# Patient Record
Sex: Female | Born: 1990 | Hispanic: No | Smoking: Never smoker
Health system: Southern US, Community
[De-identification: ages and names within clinical notes are randomized; demographics above are authoritative.]

## PROBLEM LIST (undated history)

## (undated) ENCOUNTER — Inpatient Hospital Stay (HOSPITAL_COMMUNITY): Payer: Self-pay

## (undated) DIAGNOSIS — G43109 Migraine with aura, not intractable, without status migrainosus: Secondary | ICD-10-CM

## (undated) DIAGNOSIS — IMO0002 Reserved for concepts with insufficient information to code with codable children: Secondary | ICD-10-CM

## (undated) DIAGNOSIS — F329 Major depressive disorder, single episode, unspecified: Secondary | ICD-10-CM

## (undated) DIAGNOSIS — F603 Borderline personality disorder: Secondary | ICD-10-CM

## (undated) DIAGNOSIS — N739 Female pelvic inflammatory disease, unspecified: Secondary | ICD-10-CM

## (undated) DIAGNOSIS — F419 Anxiety disorder, unspecified: Secondary | ICD-10-CM

## (undated) DIAGNOSIS — O24419 Gestational diabetes mellitus in pregnancy, unspecified control: Secondary | ICD-10-CM

## (undated) DIAGNOSIS — Z5189 Encounter for other specified aftercare: Secondary | ICD-10-CM

## (undated) DIAGNOSIS — F319 Bipolar disorder, unspecified: Secondary | ICD-10-CM

## (undated) DIAGNOSIS — F32A Depression, unspecified: Secondary | ICD-10-CM

## (undated) DIAGNOSIS — Z8759 Personal history of other complications of pregnancy, childbirth and the puerperium: Secondary | ICD-10-CM

## (undated) HISTORY — DX: Personal history of other complications of pregnancy, childbirth and the puerperium: Z87.59

## (undated) HISTORY — DX: Borderline personality disorder: F60.3

## (undated) HISTORY — DX: Bipolar disorder, unspecified: F31.9

## (undated) HISTORY — DX: Encounter for other specified aftercare: Z51.89

## (undated) HISTORY — DX: Reserved for concepts with insufficient information to code with codable children: IMO0002

## (undated) HISTORY — DX: Female pelvic inflammatory disease, unspecified: N73.9

## (undated) HISTORY — PX: WISDOM TOOTH EXTRACTION: SHX21

## (undated) HISTORY — DX: Migraine with aura, not intractable, without status migrainosus: G43.109

---
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
## 2012-01-29 DIAGNOSIS — G43909 Migraine, unspecified, not intractable, without status migrainosus: Secondary | ICD-10-CM | POA: Insufficient documentation

## 2012-01-29 DIAGNOSIS — J45909 Unspecified asthma, uncomplicated: Secondary | ICD-10-CM | POA: Insufficient documentation

## 2012-02-27 ENCOUNTER — Emergency Department (HOSPITAL_COMMUNITY)
Admission: EM | Admit: 2012-02-27 | Discharge: 2012-02-27 | Disposition: A | Discharge status: Home / Self Care | Payer: Medicaid Other | Attending: Emergency Medicine | Admitting: Emergency Medicine

## 2012-02-27 ENCOUNTER — Emergency Department (HOSPITAL_COMMUNITY): Payer: Medicaid Other

## 2012-02-27 ENCOUNTER — Encounter (HOSPITAL_COMMUNITY): Payer: Self-pay | Admitting: Physical Medicine and Rehabilitation

## 2012-02-27 DIAGNOSIS — R071 Chest pain on breathing: Secondary | ICD-10-CM | POA: Insufficient documentation

## 2012-02-27 DIAGNOSIS — R110 Nausea: Secondary | ICD-10-CM | POA: Insufficient documentation

## 2012-02-27 DIAGNOSIS — J45901 Unspecified asthma with (acute) exacerbation: Secondary | ICD-10-CM | POA: Insufficient documentation

## 2012-02-27 DIAGNOSIS — R059 Cough, unspecified: Secondary | ICD-10-CM | POA: Insufficient documentation

## 2012-02-27 DIAGNOSIS — R0789 Other chest pain: Secondary | ICD-10-CM

## 2012-02-27 DIAGNOSIS — R05 Cough: Secondary | ICD-10-CM | POA: Insufficient documentation

## 2012-02-27 DIAGNOSIS — R0602 Shortness of breath: Secondary | ICD-10-CM | POA: Insufficient documentation

## 2012-02-27 LAB — CBC WITH DIFFERENTIAL/PLATELET
Basophils Relative: 0 % (ref 0–1)
Eosinophils Absolute: 0.2 10*3/uL (ref 0.0–0.7)
Eosinophils Relative: 3 % (ref 0–5)
Hemoglobin: 12 g/dL (ref 12.0–15.0)
Lymphs Abs: 2 10*3/uL (ref 0.7–4.0)
MCH: 29.3 pg (ref 26.0–34.0)
MCHC: 32.7 g/dL (ref 30.0–36.0)
MCV: 89.5 fL (ref 78.0–100.0)
Monocytes Relative: 7 % (ref 3–12)
RBC: 4.1 MIL/uL (ref 3.87–5.11)

## 2012-02-27 NOTE — ED Notes (Signed)
Explained to pts mother that pt will be seen by RN in triage and will be back out with her in waiting area.  Also explained that she will be welcome to accompany pt once she has a room, apologized for inconvenience.

## 2012-02-27 NOTE — ED Provider Notes (Signed)
History  This chart was scribed for Monica Munoz, non-physician practitioner working with Monica Munoz,  by Monica Munoz, ED Scribe. This patient was seen in room TR10C/TR10C and the patient's care was started at 3:22 PM.  CSN: [id number]  Arrival date & time 02/27/12  1343   First MD Initiated Contact with Patient 02/27/12 1522      Chief Complaint  Patient presents with  . Chest Pain    Patient is a 22 y.o. female presenting with chest pain. The history is provided by the patient. No language interpreter was used.  Chest Pain Pain location:  Substernal area Pain radiates to:  Does not radiate Pain radiates to the back: no   Onset quality:  Sudden Duration:  1 day Timing:  Intermittent Progression:  Unchanged Chronicity:  New Context: breathing   Relieved by:  None tried Worsened by:  Nothing tried Ineffective treatments:  None tried Associated symptoms: cough, nausea and shortness of breath   Associated symptoms: no abdominal pain and not vomiting   Risk factors: no diabetes mellitus, no high cholesterol and no hypertension     Monica Munoz is a 22 y.o. female who presents to the Emergency Department complaining of intermittent substernal CP described as a grabbing pain felt mostly upon inspiration that started yesterday with associated SOB, nonproductive cough and nausea for the past week. Pt states that SOB is worse with exertion and reports that she feels SOB after walking a few feet. She denies that the SOB and CP occur simultaneously. She reports one prior episode of similar symptoms diagnosed as an ulcer. She denies being on medication for ulcers currently. She denies any recent changes in BMs and denies emesis, abdominal pain and wheezing as associated symptoms. She has a h/o asthma and is an occasional alcohol user but denies smoking.  PCP is Dr. Cyndia Munoz.  Past Medical History  Diagnosis Date  . Asthma     No past surgical history on file.  No family  history on file.  History  Substance Use Topics  . Smoking status: Never Smoker   . Smokeless tobacco: Not on file  . Alcohol Use: Yes     Comment: social    No OB history provided.  Review of Systems  Respiratory: Positive for cough and shortness of breath. Negative for wheezing.   Cardiovascular: Positive for chest pain. Negative for leg swelling.  Gastrointestinal: Positive for nausea. Negative for vomiting, abdominal pain and diarrhea.  All other systems reviewed and are negative.    Allergies  Benadryl; Diphenhydramine hcl; and Lorazepam  Home Medications   Current Outpatient Rx  Name  Route  Sig  Dispense  Refill  . acetaminophen (TYLENOL) 325 MG tablet   Oral   Take 650 mg by mouth every 6 (six) hours as needed for pain (for migraine).         Marland Kitchen albuterol (PROVENTIL HFA;VENTOLIN HFA) 108 (90 BASE) MCG/ACT inhaler   Inhalation   Inhale 2 puffs into the lungs every 6 (six) hours as needed for wheezing. For shortness of breath           Triage Vitals: BP 127/82  Pulse 104  Temp(Src) 98.1 F (36.7 C) (Oral)  Resp 16  SpO2 97%  LMP 02/22/2012  Physical Exam  Nursing note and vitals reviewed. Constitutional: She is oriented to person, place, and time. She appears well-developed and well-nourished. No distress.  HENT:  Head: Normocephalic and atraumatic.  Mouth/Throat: Oropharynx is clear and moist.  TMs  are normal bilaterally  Eyes: Conjunctivae and EOM are normal. Pupils are equal, round, and reactive to light.  Neck: Normal range of motion. Neck supple. No tracheal deviation present.  Cardiovascular: Normal rate and regular rhythm.   Pulmonary/Chest: Effort normal and breath sounds normal. No respiratory distress. She has no wheezes. She exhibits tenderness (upper sternal tenderness to palpation).  Abdominal: Soft. There is no tenderness.  Musculoskeletal: Normal range of motion. She exhibits no edema.  Lymphadenopathy:    She has no cervical  adenopathy.  Neurological: She is alert and oriented to person, place, and time.  Skin: Skin is warm and dry.  Psychiatric: She has a normal mood and affect. Her behavior is normal.    ED Course  Procedures (including critical care time)  DIAGNOSTIC STUDIES: Oxygen Saturation is 97% on room air, adequate by my interpretation.    COORDINATION OF CARE: 3:27 PM- Pt was tachycardic upon arrival to the ED (HR 104) but was not during my exam. Advised pt of negative radiology results. Discussed treatment plan which includes CXR, d-dimer and CBC with pt and pt agreed to plan.   4:31 PM- Advised pt of radiology and lab work results. Discussed discharge plan which includes tyelnol for pain and asthma inhaler for SOB with pt and pt agreed to plan. Also advised pt to be cautious with NSAIDs due to her h/o ulcers and to follow up with PCP and pt agreed.  Labs Reviewed  CBC WITH DIFFERENTIAL  D-DIMER, QUANTITATIVE   Dg Chest 2 View  02/27/2012  *RADIOLOGY REPORT*  Clinical Data: Chest pain and shortness of breath since yesterday. History of asthma.  CHEST - 2 VIEW  Comparison: None.  Findings:  The heart size and mediastinal contours are within normal limits.  Both lungs are clear.  The visualized skeletal structures are unremarkable.  IMPRESSION: No active cardiopulmonary disease.   Original Report Authenticated By: Davonna Belling, M.D.      No diagnosis found.    MDM    I personally performed the services described in this documentation, which was scribed in my presence. The recorded information has been reviewed and is accurate.        Jimmye Norman, NP 02/27/12 [phone]

## 2012-02-27 NOTE — ED Notes (Signed)
Pt presents to department for evaluation of chest pain upon inspiration. Onset last night while at home. 6/10 pain with breathing. Respirations unlabored. Lung sounds clear and equal bilaterally. Also states dry cough x1 week. Pt is alert and oriented x4. No signs of acute distress noted.

## 2012-02-28 NOTE — ED Provider Notes (Signed)
Medical screening examination/treatment/procedure(s) were performed by non-physician practitioner and as supervising physician I was immediately available for consultation/collaboration.  Caterra Ostroff J. Shyler Hamill, MD 02/28/12 2306 

## 2012-04-25 ENCOUNTER — Ambulatory Visit (INDEPENDENT_AMBULATORY_CARE_PROVIDER_SITE_OTHER): Payer: Medicaid Other | Admitting: Neurology

## 2012-04-25 ENCOUNTER — Encounter: Payer: Self-pay | Admitting: Neurology

## 2012-04-25 VITALS — BP 115/77 | HR 98 | Temp 98.8°F | Ht 65.0 in | Wt 155.0 lb

## 2012-04-25 DIAGNOSIS — G43009 Migraine without aura, not intractable, without status migrainosus: Secondary | ICD-10-CM

## 2012-04-25 DIAGNOSIS — G43109 Migraine with aura, not intractable, without status migrainosus: Secondary | ICD-10-CM

## 2012-04-25 HISTORY — DX: Migraine with aura, not intractable, without status migrainosus: G43.109

## 2012-04-25 MED ORDER — TOPIRAMATE 50 MG PO TABS: 50.0000 mg | ORAL_TABLET | Freq: Two times a day (BID) | ORAL | Status: DC

## 2012-04-25 NOTE — Progress Notes (Signed)
Subjective:    Patient ID: Monica Munoz is a 22 y.o. female.  HPI  Huston Foley, MD, PhD [facility] [address], [address] [address] [place], [place] [address]  Dear Dr. Cyndia Bent,   I saw your patient, Monica Munoz, upon your kind request in my neurologic clinic today for initial consultation of her headaches. The patient is unaccompanied today. As you know, Monica Munoz is a very pleasant 22 year old right-handed woman with a underlying medical history of asthma and allergies who has been experiencing migrainous headaches for the past several years. She had migraines since 2008 when she was still living in [place]. The problem has been intermittent in the past and pain is primarily located in the frontal area with the severity up to 7/10. The patient has had some lightheadedness, but denies any recent fevers, cold symptoms or associated neurological complications such as one-sided weakness, numbness, tingling, slurring of speech or droopy face. There is a family history of migraines. The pain is described as throbbing and is associated with nausea but not vomiting. She does describe photophobia and sonophobia. She has never had a brain scan. Since last year, she has had more frequent HAs.  She sleeps fairly well at night and is not known to snore.  She has tried over-the-counter medications including Excedrin, Aleve, Ibuprofen, tylenol. Prescription medications tried are: fioricet, which caused a stomach ulcer. She does not recall having tried a triptan. She has also tried Fiorinal. She has never been on any prescription medication for prevention. She has not had her eyes checked since last year. She was given a Rx for glasses, but never had them made.  There is a hx of blood clots in her mother and MGF, but the patient has not been tested for a clotting d/o, she has never had a blood clot, but is not able to take OCP. Mother, MGF and his sisters had strokes, mother has had seizures as  well. Her father passed away at 20 with heart and he had a "hole in his heart". She has 2 older sisters, one had migraines, and has epilepsy.   Her Past Medical History Is Significant For: Past Medical History  Diagnosis Date  . Asthma     Her Past Surgical History Is Significant For: No past surgical history on file.  Her Family History Is Significant For: No family history on file.  Her Social History Is Significant For: History   Social History  . Marital Status: Single    Spouse Name: N/A    Number of Children: N/A  . Years of Education: N/A   Social History Main Topics  . Smoking status: Never Smoker   . Smokeless tobacco: None  . Alcohol Use: Yes     Comment: social  . Drug Use: No  . Sexually Active: None   Other Topics Concern  . None   Social History Narrative  . None    Her Allergies Are:  Allergies  Allergen Reactions  . Benadryl (Diphenhydramine Hcl)   . Diphenhydramine Hcl   . Lorazepam     Patient states that it makes her "loopy" and "off the wall"  :   Her Current Medications Are:  Outpatient Encounter Prescriptions as of 04/25/2012  Medication Sig Dispense Refill  . acetaminophen (TYLENOL) 325 MG tablet Take 650 mg by mouth every 6 (six) hours as needed for pain (for migraine).      Marland Kitchen albuterol (PROVENTIL HFA;VENTOLIN HFA) 108 (90 BASE) MCG/ACT inhaler Inhale 2  puffs into the lungs every 6 (six) hours as needed for wheezing. For shortness of breath       No facility-administered encounter medications on file as of 04/25/2012.  :  Review of Systems  Neurological: Positive for dizziness and headaches.  Psychiatric/Behavioral: Positive for dysphoric mood.       Insomnia    Objective:  Neurologic Exam  Physical Exam Physical Examination:   Filed Vitals:   04/25/12 1334  BP: 115/77  Pulse: 98  Temp: 98.8 F (37.1 C)    General Examination: The patient is a very pleasant 22 y.o. female in no acute distress.  HEENT: Normocephalic,  atraumatic, pupils are equal, round and reactive to light and accommodation. Funduscopic exam is normal with sharp disc margins noted. Extraocular tracking is good without nystagmus noted. Normal smooth pursuit is noted. Hearing is grossly intact. Tympanic membranes are clear bilaterally. Face is symmetric with normal facial animation and normal facial sensation. Speech is clear with no dysarthria noted. There is no hypophonia. There is no lip, neck or jaw tremor. Neck is supple with full range of motion. There are no carotid bruits on auscultation. Oropharynx exam reveals normal findings. No significant airway crowding is noted. Mallampati is class I. Tongue protrudes centrally and palate elevates symmetrically. Tonsils are 2+.   Chest: is clear to auscultation without wheezing, rhonchi or crackles noted.  Heart: sounds are regular and normal without murmurs, rubs or gallops noted.   Abdomen: is soft, non-tender and non-distended with normal bowel sounds appreciated on auscultation.  Extremities: There is no pitting edema in the distal lower extremities bilaterally. Pedal pulses are intact.  Skin: is warm and dry with no trophic changes noted.  Musculoskeletal: exam reveals no obvious joint deformities, tenderness or joint swelling or erythema.  Neurologically:  Mental status: The patient is awake, alert and oriented in all 4 spheres. Her memory, attention, language and knowledge are appropriate. There is no aphasia, agnosia, apraxia or anomia. Speech is clear with normal prosody and enunciation. Thought process is linear. Mood is congruent and affect is normal.  Cranial nerves are as described above under HEENT exam. In addition, shoulder shrug is normal with equal shoulder height noted. Motor exam: Normal bulk, strength and tone is noted. There is no drift, tremor or rebound. Romberg is negative. Reflexes are 2+ throughout. Toes are downgoing bilaterally. Fine motor skills are intact with normal  finger taps, normal hand movements, normal rapid alternating patting, normal foot taps and normal foot agility.  Cerebellar testing shows no dysmetria or intention tremor on finger to nose testing. Heel to shin is unremarkable. There is no truncal or gait ataxia.  Sensory exam is intact to light touch, pinprick, vibration, temperature sense and proprioception in the upper and lower extremities.  Gait, station and balance are unremarkable. No veering to one side is noted. No leaning to one side. Posture is age-appropriate and stance is narrow based. No problems turning are noted. She turns en bloc. Tandem walk is unremarkable. Intact toe and heel stance is noted.               Assessment and Plan:   Assessment and Plan:  In summary, Monica Munoz is a very pleasant 22 y.o.-year old female with a history of migraines without aura. Her physical exam is non-focal and I reassured the patient in that regard. Nevertheless, she has a complicated FHx, including heart d/s, clotting d/o, strokes and epilepsy. I had a long chat with the patient about my  findings and the diagnosis of migraine, the prognosis and treatment options. We talked about medical treatments and non-pharmacological approaches. We talked about stopping marijuana maintaining a healthy lifestyle in general. I encouraged the patient to eat healthy, exercise daily and keep well hydrated, to keep a scheduled bedtime and wake time routine, to not skip any meals and eat healthy snacks in between meals and to have protein with every meal.   As far as further diagnostic testing is concerned, I suggested the following today: Echocardiogram to look for PFO, MRI of brain w/o Gad, Comprehensive metabolic profile, ANA, anticardiolipin antibodies, antithrombin III, CBC, coagulation profile, CRP, ESR, factor V Leiden, lupus anticoagulant, protein C, protein S and TSH. As far as medications are concerned, I recommended the following at this time: starting topiramate,  50 mg, 1/2 each night for 1 week, then 1 pill each night for 1 week, then 1 1/2 pills each night for 1 week, then 2 pills each night thereafter.  I answered all her questions today and the patient was in agreement with the above outlined plan. I would like to see the patient back in 3 months, sooner if the need arises and encouraged her to call with any interim questions, concerns, problems or updates and refill requests and test results.   Thank you very much for allowing me to participate in the care of this nice patient. If I can be of any further assistance to you please do not hesitate to call me at [phone].  Sincerely,   Huston Foley, MD, PhD

## 2012-04-25 NOTE — Patient Instructions (Addendum)
I think overall you are doing fairly well but I do want to suggest a few things today: Your exam is normal. I do want to do further testing:   Echocardiogram to look for PFO, MRI of brain w/o Gad, Comprehensive metabolic profile, ANA, anticardiolipin antibodies, antithrombin III, CBC, coagulation profile, CRP, ESR, factor V Leiden, lupus anticoagulant, protein C, protein S and TSH. As far as medications are concerned, I recommended the following at this time: starting topiramate, 50 mg, 1/2 each night for 1 week, then 1 pill each night for 1 week, then 1 1/2 pills each night for 1 week, then 2 pills each night thereafter.   Remember to drink plenty of fluid, eat healthy meals and do not skip any meals. Try to eat protein with a every meal and eat a healthy snack such as fruit or nuts in between meals. Try to keep a regular sleep-wake schedule and try to exercise daily, particularly in the form of walking, 20-30 minutes a day, if you can.   I would like to see you back in 3 months, sooner if we need to. Please call us with any interim questions, concerns, problems, updates or refill requests.  Please also call us for any test results so we can go over those with you on the phone. Brett Canales is my clinical assistant and will answer any of your questions and relay your messages to me and also relay most of my messages to you.  Our phone number is [phone]. We also have an after hours call service for urgent matters and there is a physician on-call for urgent questions. For any emergencies you know to call 911 or go to the nearest emergency room.

## 2012-05-02 LAB — COMPREHENSIVE METABOLIC PANEL
ALT: 13 IU/L (ref 0–32)
AST: 16 IU/L (ref 0–40)
Albumin/Globulin Ratio: 1.5 (ref 1.1–2.5)
Alkaline Phosphatase: 92 IU/L (ref 39–117)
BUN/Creatinine Ratio: 14 (ref 8–20)
Chloride: 102 mmol/L (ref 97–108)
GFR calc Af Amer: 147 mL/min/{1.73_m2} (ref >59)
GFR calc non Af Amer: 128 mL/min/{1.73_m2} (ref >59)
Potassium: 4.1 mmol/L (ref 3.5–5.2)
Sodium: 140 mmol/L (ref 134–144)
Total Bilirubin: 0.4 mg/dL (ref 0.0–1.2)

## 2012-05-02 LAB — COAGULATION PANEL
APTT PPP: 54.9 s — ABNORMAL HIGH (ref 23.4–36.4)
AntiThromb III Func: 109 % (ref 75–135)
Dilute Viper Venom Time: 39.4 s (ref 0.0–55.1)
Factor V Activity: 42 % — ABNORMAL LOW (ref 60–140)
INR: 2 — ABNORMAL HIGH (ref 0.8–1.2)
PT: 22.5 s — ABNORMAL HIGH (ref 11.9–14.1)
PTT Lupus Anticoagulant: 38.9 s (ref 0.0–50.0)
Protein S Ag, Total: 90 % (ref 58–150)
dPT Confirm Ratio: 0.99 Ratio (ref 0.00–1.20)

## 2012-05-02 LAB — PROTIME-INR: Prothrombin Time: 11.8 s (ref 9.1–12.0)

## 2012-05-06 ENCOUNTER — Encounter (HOSPITAL_COMMUNITY): Payer: Self-pay

## 2012-05-06 ENCOUNTER — Encounter: Payer: Self-pay | Admitting: *Deleted

## 2012-05-06 ENCOUNTER — Ambulatory Visit (HOSPITAL_COMMUNITY): Payer: Medicaid Other | Attending: Internal Medicine | Admitting: Radiology

## 2012-05-06 DIAGNOSIS — I319 Disease of pericardium, unspecified: Secondary | ICD-10-CM | POA: Insufficient documentation

## 2012-05-06 DIAGNOSIS — R42 Dizziness and giddiness: Secondary | ICD-10-CM

## 2012-05-06 DIAGNOSIS — I079 Rheumatic tricuspid valve disease, unspecified: Secondary | ICD-10-CM | POA: Insufficient documentation

## 2012-05-06 DIAGNOSIS — G43009 Migraine without aura, not intractable, without status migrainosus: Secondary | ICD-10-CM

## 2012-05-06 NOTE — Progress Notes (Signed)
Echocardiogram performed with a Bubble Study.  

## 2012-05-06 NOTE — Progress Notes (Signed)
Quick Note:  Spoke with patient and relayed results of blood work. Patient understood and had no questions.  ______ 

## 2012-05-06 NOTE — Progress Notes (Signed)
Quick Note:  Please call and advise the patient that the recent labs we checked were within normal limits. We checked cell count, electrolytes, kidney function, liver function, thyroid function, inflammatory markers and for clotting disorders and everything looks okay. I would like to fax the labs to PCP. Pls find out who she sees and fax to them, thx. Please remind patient to keep any upcoming appointments and call with any interim questions, concerns, problems or updates. Thanks,  Huston Foley, MD, PhD   ______

## 2012-05-06 NOTE — Progress Notes (Signed)
Patient ID: Monica Munoz, female   DOB: 1990/06/15, 22 y.o.   MRN: [id number]  IV with 22 G angiocath (R) AC x 1, tolerated well for Echo Bubble Study.Irean Hong, RN.

## 2012-05-07 ENCOUNTER — Other Ambulatory Visit: Payer: Medicaid Other

## 2012-05-07 NOTE — Progress Notes (Signed)
Quick Note:  Please call and let patient know that her recent echocardiogram, which is the ultrasound of her heart was reported as normal.  Huston Foley, MD, PhD [facility] ([facility]  ______

## 2012-05-08 ENCOUNTER — Ambulatory Visit
Admission: RE | Admit: 2012-05-08 | Discharge: 2012-05-08 | Disposition: A | Discharge status: Home / Self Care | Payer: Medicaid Other | Source: Ambulatory Visit | Attending: Neurology | Admitting: Neurology

## 2012-05-08 DIAGNOSIS — R51 Headache: Secondary | ICD-10-CM

## 2012-05-08 DIAGNOSIS — G43009 Migraine without aura, not intractable, without status migrainosus: Secondary | ICD-10-CM

## 2012-05-09 NOTE — Progress Notes (Signed)
Quick Note:  Please call and advise the patient that the recent scan we did was within normal limits. We checked a brain MRI without contrast. Please remind patient to keep any upcoming appointments and call with any interim questions, concerns, problems or updates. Thanks,  Huston Foley, MD, PhD  ______

## 2012-05-10 NOTE — Progress Notes (Signed)
Quick Note:  Spoke with patient and relayed results of her recent EKG. Patient understood and had no questions. ______

## 2012-07-26 ENCOUNTER — Ambulatory Visit: Payer: Medicaid Other | Admitting: Neurology

## 2012-07-30 ENCOUNTER — Ambulatory Visit: Payer: Medicaid Other | Admitting: Neurology

## 2012-09-10 ENCOUNTER — Emergency Department (HOSPITAL_COMMUNITY)
Admission: EM | Admit: 2012-09-10 | Discharge: 2012-09-10 | Disposition: A | Discharge status: Home / Self Care | Payer: Medicaid Other | Attending: Emergency Medicine | Admitting: Emergency Medicine

## 2012-09-10 ENCOUNTER — Emergency Department (HOSPITAL_COMMUNITY): Payer: Medicaid Other

## 2012-09-10 DIAGNOSIS — Z79899 Other long term (current) drug therapy: Secondary | ICD-10-CM | POA: Insufficient documentation

## 2012-09-10 DIAGNOSIS — Z3490 Encounter for supervision of normal pregnancy, unspecified, unspecified trimester: Secondary | ICD-10-CM

## 2012-09-10 DIAGNOSIS — O9989 Other specified diseases and conditions complicating pregnancy, childbirth and the puerperium: Secondary | ICD-10-CM | POA: Insufficient documentation

## 2012-09-10 DIAGNOSIS — G43109 Migraine with aura, not intractable, without status migrainosus: Secondary | ICD-10-CM | POA: Insufficient documentation

## 2012-09-10 DIAGNOSIS — J45909 Unspecified asthma, uncomplicated: Secondary | ICD-10-CM | POA: Insufficient documentation

## 2012-09-10 DIAGNOSIS — R0789 Other chest pain: Secondary | ICD-10-CM

## 2012-09-10 DIAGNOSIS — R071 Chest pain on breathing: Secondary | ICD-10-CM | POA: Insufficient documentation

## 2012-09-10 LAB — POCT PREGNANCY, URINE: Preg Test, Ur: POSITIVE — AB

## 2012-09-10 NOTE — ED Notes (Signed)
Bed: WA06 Expected date: 09/10/12 Expected time: 8:34 PM Means of arrival: Ambulance Comments: EMS--chest wall pain

## 2012-09-10 NOTE — ED Provider Notes (Signed)
CSN: [id number]     Arrival date & time 09/10/12  2047 History   First MD Initiated Contact with Patient 09/10/12 2118     Chief Complaint  Patient presents with  . Chest Wall Pain    (Consider location/radiation/quality/duration/timing/severity/associated sxs/prior Treatment) Patient is a 22 y.o. female presenting with chest pain.  Chest Pain Pain location:  R chest Pain quality: sharp   Pain radiates to:  Does not radiate Pain radiates to the back: no   Pain severity:  Moderate Onset quality:  Sudden Duration:  3 hours Timing:  Intermittent Progression:  Waxing and waning Chronicity:  New Context: breathing   Relieved by:  None tried Worsened by:  Deep breathing Ineffective treatments:  None tried Associated symptoms: no cough   Risk factors: smoking   Risk factors: no birth control     Past Medical History  Diagnosis Date  . Asthma   . Migraine with aura 04/25/2012   No past surgical history on file. No family history on file. History  Substance Use Topics  . Smoking status: Never Smoker   . Smokeless tobacco: Not on file  . Alcohol Use: Yes     Comment: social   OB History   Grav Para Term Preterm Abortions TAB SAB Ect Mult Living                 Review of Systems  Respiratory: Negative for cough.   Cardiovascular: Positive for chest pain.  All other systems reviewed and are negative.    Allergies  Benadryl; Diphenhydramine hcl; and Lorazepam  Home Medications   Current Outpatient Rx  Name  Route  Sig  Dispense  Refill  . acetaminophen (TYLENOL) 325 MG tablet   Oral   Take 650 mg by mouth every 6 (six) hours as needed for pain.          Marland Kitchen albuterol (PROVENTIL HFA;VENTOLIN HFA) 108 (90 BASE) MCG/ACT inhaler   Inhalation   Inhale 2 puffs into the lungs every 6 (six) hours as needed for wheezing or shortness of breath.          Marland Kitchen ibuprofen (ADVIL,MOTRIN) 200 MG tablet   Oral   Take 600 mg by mouth every 8 (eight) hours as needed for pain.        Marland Kitchen topiramate (TOPAMAX) 50 MG tablet   Oral   Take 1 tablet (50 mg total) by mouth 2 (two) times daily. Follow instructions provided   60 tablet   3    BP 123/71  Pulse 87  Temp(Src) 98 F (36.7 C) (Oral)  Resp 16  SpO2 100%  LMP 07/11/2012 Physical Exam  Nursing note and vitals reviewed. Constitutional: She is oriented to person, place, and time. She appears well-developed and well-nourished.  HENT:  Head: Normocephalic.  Eyes: Pupils are equal, round, and reactive to light.  Neck: Normal range of motion.  Cardiovascular: Normal rate and regular rhythm.   Pulmonary/Chest: Effort normal and breath sounds normal. She exhibits tenderness.  Abdominal: Soft. Bowel sounds are normal.  Musculoskeletal: She exhibits no edema and no tenderness.  Lymphadenopathy:    She has no cervical adenopathy.  Neurological: She is alert and oriented to person, place, and time.  Skin: Skin is warm and dry.  Psychiatric: She has a normal mood and affect. Her behavior is normal. Judgment and thought content normal.    ED Course  Procedures (including critical care time) Labs Review Labs Reviewed - No data to display Imaging Review Dg  Chest 2 View  09/10/2012   *RADIOLOGY REPORT*  Clinical Data: Chest wall pain.  CHEST - 2 VIEW  Comparison: 02/27/2012  Findings: The lungs are hypoinflated with slight elevation of the left hemidiaphragm. There is no focal consolidation or effusion. Cardiomediastinal silhouette and remainder of the exam is unchanged.  IMPRESSION: No acute cardiopulmonary disease.   Original Report Authenticated By: Elberta Fortis, M.D.   Radiology and lab results reviewed, shared with patient. Chest wall pain on palpation.  No dyspnea.  Not tachycardic.  100% O2 saturation. Patient denies pelvic or abdominal pain, dysuria, vaginal discharge.  MDM  No diagnosis found. Chest wall pain. Early pregnancy.    Jimmye Norman, NP 09/11/12 [phone]

## 2012-09-10 NOTE — ED Notes (Addendum)
Pt is having chest wall pain that started when she was laughing earlier. Center of chest and underR breast. Hurts more when she breathes.

## 2012-09-11 NOTE — ED Provider Notes (Signed)
Medical screening examination/treatment/procedure(s) were performed by non-physician practitioner and as supervising physician I was immediately available for consultation/collaboration.  Flint Melter, MD 09/11/12 [phone]

## 2012-10-02 LAB — OB RESULTS CONSOLE ABO/RH: RH Type: POSITIVE

## 2012-10-02 LAB — OB RESULTS CONSOLE GBS: GBS: POSITIVE

## 2012-10-02 LAB — OB RESULTS CONSOLE RUBELLA ANTIBODY, IGM: RUBELLA: IMMUNE

## 2012-10-02 LAB — OB RESULTS CONSOLE ANTIBODY SCREEN: ANTIBODY SCREEN: NEGATIVE

## 2012-10-02 LAB — OB RESULTS CONSOLE HIV ANTIBODY (ROUTINE TESTING): HIV: NONREACTIVE

## 2012-10-02 LAB — OB RESULTS CONSOLE GC/CHLAMYDIA
CHLAMYDIA, DNA PROBE: NEGATIVE
Gonorrhea: NEGATIVE

## 2012-10-02 LAB — OB RESULTS CONSOLE RPR: RPR: NONREACTIVE

## 2012-10-02 LAB — OB RESULTS CONSOLE HEPATITIS B SURFACE ANTIGEN: HEP B S AG: NEGATIVE

## 2012-10-12 ENCOUNTER — Encounter: Payer: Self-pay | Admitting: Obstetrics and Gynecology

## 2012-10-12 DIAGNOSIS — O09899 Supervision of other high risk pregnancies, unspecified trimester: Secondary | ICD-10-CM | POA: Insufficient documentation

## 2012-10-12 DIAGNOSIS — Z87440 Personal history of urinary (tract) infections: Secondary | ICD-10-CM

## 2013-01-14 ENCOUNTER — Telehealth: Payer: Self-pay | Admitting: Family Medicine

## 2013-01-15 NOTE — Telephone Encounter (Signed)
Made error wrong patient didn't correct date of birth.

## 2013-01-16 NOTE — L&D Delivery Note (Signed)
Delivery Note  Pt pushing had been pushing involuntarily, then continued pushing well, FHR w early variables, overall remained reassuring     At 8:49 PM a viable female was delivered via Vaginal, Spontaneous Delivery (Presentation: Right Occiput Anterior).  Shoulders delivered easily  APGAR: 9, 9; weight 8 lb 10.3 oz (3920 g).   Placenta status: Intact, Spontaneous.  Cord: 3 vessels with the following complications: None.  Cord pH: not collected  Anesthesia: Epidural, lidocoaine for repair  Episiotomy: None Lacerations: 2nd degree;R Labial;Perineal Suture Repair: 3.0 vicryl 4-0 monocryl Est. Blood Loss (mL):   Mom to postpartum.  Baby to Couplet care / Skin to Skin. Pt plans to BF Pt declines circumcision   Monica Munoz 05/28/2013, 12:14 AM

## 2013-02-02 ENCOUNTER — Inpatient Hospital Stay (HOSPITAL_COMMUNITY)
Admission: AD | Admit: 2013-02-02 | Discharge: 2013-02-03 | Disposition: A | Discharge status: Home / Self Care | Payer: Medicaid Other | Source: Ambulatory Visit | Attending: Obstetrics and Gynecology | Admitting: Obstetrics and Gynecology

## 2013-02-02 DIAGNOSIS — O9989 Other specified diseases and conditions complicating pregnancy, childbirth and the puerperium: Principal | ICD-10-CM

## 2013-02-02 DIAGNOSIS — Y92009 Unspecified place in unspecified non-institutional (private) residence as the place of occurrence of the external cause: Secondary | ICD-10-CM | POA: Insufficient documentation

## 2013-02-02 DIAGNOSIS — W010XXA Fall on same level from slipping, tripping and stumbling without subsequent striking against object, initial encounter: Secondary | ICD-10-CM | POA: Insufficient documentation

## 2013-02-02 DIAGNOSIS — O99891 Other specified diseases and conditions complicating pregnancy: Secondary | ICD-10-CM | POA: Insufficient documentation

## 2013-02-02 DIAGNOSIS — Y9301 Activity, walking, marching and hiking: Secondary | ICD-10-CM | POA: Insufficient documentation

## 2013-02-02 DIAGNOSIS — R109 Unspecified abdominal pain: Secondary | ICD-10-CM | POA: Insufficient documentation

## 2013-02-02 HISTORY — DX: Major depressive disorder, single episode, unspecified: F32.9

## 2013-02-02 HISTORY — DX: Anxiety disorder, unspecified: F41.9

## 2013-02-02 HISTORY — DX: Depression, unspecified: F32.A

## 2013-02-03 ENCOUNTER — Inpatient Hospital Stay (HOSPITAL_COMMUNITY): Payer: Medicaid Other

## 2013-02-03 ENCOUNTER — Encounter (HOSPITAL_COMMUNITY): Payer: Self-pay

## 2013-02-03 MED ORDER — IBUPROFEN 600 MG PO TABS
600.0000 mg | ORAL_TABLET | Freq: Once | ORAL | Status: AC
  Administered 2013-02-03: 600 mg via ORAL
  Filled 2013-02-03: qty 1

## 2013-02-03 NOTE — MAU Note (Signed)
Tripped over cat and landed on left side around 2300 tonight. Lower abdominal pain since then. Denies LOF or Vaginal bleeding. Last fetal movement 6pm today.

## 2013-02-03 NOTE — Discharge Instructions (Signed)
Second Trimester of Pregnancy The second trimester is from week 13 through week 28, months 4 through 6. The second trimester is often a time when you feel your best. Your body has also adjusted to being pregnant, and you begin to feel better physically. Usually, morning sickness has lessened or quit completely, you may have more energy, and you may have an increase in appetite. The second trimester is also a time when the fetus is growing rapidly. At the end of the sixth month, the fetus is about 9 inches long and weighs about 1 pounds. You will likely begin to feel the baby move (quickening) between 18 and 20 weeks of the pregnancy. BODY CHANGES Your body goes through many changes during pregnancy. The changes vary from woman to woman.   Your weight will continue to increase. You will notice your lower abdomen bulging out.  You may begin to get stretch marks on your hips, abdomen, and breasts.  You may develop headaches that can be relieved by medicines approved by your caregiver.  You may urinate more often because the fetus is pressing on your bladder.  You may develop or continue to have heartburn as a result of your pregnancy.  You may develop constipation because certain hormones are causing the muscles that push waste through your intestines to slow down.  You may develop hemorrhoids or swollen, bulging veins (varicose veins).  You may have back pain because of the weight gain and pregnancy hormones relaxing your joints between the bones in your pelvis and as a result of a shift in weight and the muscles that support your balance.  Your breasts will continue to grow and be tender.  Your gums may bleed and may be sensitive to brushing and flossing.  Dark spots or blotches (chloasma, mask of pregnancy) may develop on your face. This will likely fade after the baby is born.  A dark line from your belly button to the pubic area (linea nigra) may appear. This will likely fade after the  baby is born. WHAT TO EXPECT AT YOUR PRENATAL VISITS During a routine prenatal visit:  You will be weighed to make sure you and the fetus are growing normally.  Your blood pressure will be taken.  Your abdomen will be measured to track your baby's growth.  The fetal heartbeat will be listened to.  Any test results from the previous visit will be discussed. Your caregiver may ask you:  How you are feeling.  If you are feeling the baby move.  If you have had any abnormal symptoms, such as leaking fluid, bleeding, severe headaches, or abdominal cramping.  If you have any questions. Other tests that may be performed during your second trimester include:  Blood tests that check for:  Low iron levels (anemia).  Gestational diabetes (between 24 and 28 weeks).  Rh antibodies.  Urine tests to check for infections, diabetes, or protein in the urine.  An ultrasound to confirm the proper growth and development of the baby.  An amniocentesis to check for possible genetic problems.  Fetal screens for spina bifida and Down syndrome. HOME CARE INSTRUCTIONS   Avoid all smoking, herbs, alcohol, and unprescribed drugs. These chemicals affect the formation and growth of the baby.  Follow your caregiver's instructions regarding medicine use. There are medicines that are either safe or unsafe to take during pregnancy.  Exercise only as directed by your caregiver. Experiencing uterine cramps is a good sign to stop exercising.  Continue to eat regular,   healthy meals.  Wear a good support bra for breast tenderness.  Do not use hot tubs, steam rooms, or saunas.  Wear your seat belt at all times when driving.  Avoid raw meat, uncooked cheese, cat litter boxes, and soil used by cats. These carry germs that can cause birth defects in the baby.  Take your prenatal vitamins.  Try taking a stool softener (if your caregiver approves) if you develop constipation. Eat more high-fiber foods,  such as fresh vegetables or fruit and whole grains. Drink plenty of fluids to keep your urine clear or pale yellow.  Take warm sitz baths to soothe any pain or discomfort caused by hemorrhoids. Use hemorrhoid cream if your caregiver approves.  If you develop varicose veins, wear support hose. Elevate your feet for 15 minutes, 3 4 times a day. Limit salt in your diet.  Avoid heavy lifting, wear low heel shoes, and practice good posture.  Rest with your legs elevated if you have leg cramps or low back pain.  Visit your dentist if you have not gone yet during your pregnancy. Use a soft toothbrush to brush your teeth and be gentle when you floss.  A sexual relationship may be continued unless your caregiver directs you otherwise.  Continue to go to all your prenatal visits as directed by your caregiver. SEEK MEDICAL CARE IF:   You have dizziness.  You have mild pelvic cramps, pelvic pressure, or nagging pain in the abdominal area.  You have persistent nausea, vomiting, or diarrhea.  You have a bad smelling vaginal discharge.  You have pain with urination. SEEK IMMEDIATE MEDICAL CARE IF:   You have a fever.  You are leaking fluid from your vagina.  You have spotting or bleeding from your vagina.  You have severe abdominal cramping or pain.  You have rapid weight gain or loss.  You have shortness of breath with chest pain.  You notice sudden or extreme swelling of your face, hands, ankles, feet, or legs.  You have not felt your baby move in over an hour.  You have severe headaches that do not go away with medicine.  You have vision changes. Document Released: 12/27/2000 Document Revised: 09/04/2012 Document Reviewed: 03/05/2012 [employer] Patient Information 2014 [employer].  

## 2013-05-21 ENCOUNTER — Telehealth (HOSPITAL_COMMUNITY): Payer: Self-pay | Admitting: *Deleted

## 2013-05-21 ENCOUNTER — Encounter (HOSPITAL_COMMUNITY): Payer: Self-pay | Admitting: *Deleted

## 2013-05-21 NOTE — Telephone Encounter (Signed)
Preadmission screen  

## 2013-05-26 ENCOUNTER — Inpatient Hospital Stay (HOSPITAL_COMMUNITY)
Admission: RE | Admit: 2013-05-26 | Discharge: 2013-05-29 | DRG: 775 | Disposition: A | Discharge status: Home / Self Care | Payer: Medicaid Other | Source: Ambulatory Visit | Attending: Obstetrics and Gynecology | Admitting: Obstetrics and Gynecology

## 2013-05-26 ENCOUNTER — Encounter (HOSPITAL_COMMUNITY): Payer: Self-pay

## 2013-05-26 VITALS — BP 105/61 | HR 91 | Temp 98.3°F | Resp 18 | Ht 65.0 in | Wt 168.0 lb

## 2013-05-26 DIAGNOSIS — O480 Post-term pregnancy: Principal | ICD-10-CM | POA: Diagnosis present

## 2013-05-26 DIAGNOSIS — O9903 Anemia complicating the puerperium: Secondary | ICD-10-CM | POA: Diagnosis not present

## 2013-05-26 DIAGNOSIS — J45909 Unspecified asthma, uncomplicated: Secondary | ICD-10-CM

## 2013-05-26 DIAGNOSIS — F3289 Other specified depressive episodes: Secondary | ICD-10-CM | POA: Diagnosis present

## 2013-05-26 DIAGNOSIS — F32A Depression, unspecified: Secondary | ICD-10-CM

## 2013-05-26 DIAGNOSIS — F329 Major depressive disorder, single episode, unspecified: Secondary | ICD-10-CM

## 2013-05-26 DIAGNOSIS — F411 Generalized anxiety disorder: Secondary | ICD-10-CM

## 2013-05-26 DIAGNOSIS — O9989 Other specified diseases and conditions complicating pregnancy, childbirth and the puerperium: Secondary | ICD-10-CM

## 2013-05-26 DIAGNOSIS — F419 Anxiety disorder, unspecified: Secondary | ICD-10-CM | POA: Insufficient documentation

## 2013-05-26 DIAGNOSIS — D649 Anemia, unspecified: Secondary | ICD-10-CM | POA: Diagnosis not present

## 2013-05-26 DIAGNOSIS — Z8279 Family history of other congenital malformations, deformations and chromosomal abnormalities: Secondary | ICD-10-CM

## 2013-05-26 DIAGNOSIS — Z889 Allergy status to unspecified drugs, medicaments and biological substances status: Secondary | ICD-10-CM | POA: Insufficient documentation

## 2013-05-26 DIAGNOSIS — Z22330 Carrier of Group B streptococcus: Secondary | ICD-10-CM

## 2013-05-26 DIAGNOSIS — J4520 Mild intermittent asthma, uncomplicated: Secondary | ICD-10-CM | POA: Insufficient documentation

## 2013-05-26 DIAGNOSIS — O99892 Other specified diseases and conditions complicating childbirth: Secondary | ICD-10-CM | POA: Diagnosis present

## 2013-05-26 DIAGNOSIS — O99344 Other mental disorders complicating childbirth: Secondary | ICD-10-CM | POA: Diagnosis present

## 2013-05-26 LAB — CBC
HCT: 34.4 % — ABNORMAL LOW (ref 36.0–46.0)
HEMOGLOBIN: 11.9 g/dL — AB (ref 12.0–15.0)
MCH: 32.9 pg (ref 26.0–34.0)
MCHC: 34.6 g/dL (ref 30.0–36.0)
MCV: 95 fL (ref 78.0–100.0)
Platelets: 231 10*3/uL (ref 150–400)
RBC: 3.62 MIL/uL — AB (ref 3.87–5.11)
RDW: 12.8 % (ref 11.5–15.5)
WBC: 13.4 10*3/uL — AB (ref 4.0–10.5)

## 2013-05-26 MED ORDER — ACETAMINOPHEN 325 MG PO TABS: 650.0000 mg | ORAL_TABLET | ORAL | Status: DC | PRN

## 2013-05-26 MED ORDER — LACTATED RINGERS IV SOLN: 500.0000 mL | INTRAVENOUS | Status: DC | PRN

## 2013-05-26 MED ORDER — TERBUTALINE SULFATE 1 MG/ML IJ SOLN: 0.2500 mg | Freq: Once | INTRAMUSCULAR | Status: AC | PRN

## 2013-05-26 MED ORDER — OXYCODONE-ACETAMINOPHEN 5-325 MG PO TABS
1.0000 | ORAL_TABLET | ORAL | Status: DC | PRN
  Administered 2013-05-28: 1 via ORAL
  Filled 2013-05-26: qty 1

## 2013-05-26 MED ORDER — CITRIC ACID-SODIUM CITRATE 334-500 MG/5ML PO SOLN: 30.0000 mL | ORAL | Status: DC | PRN

## 2013-05-26 MED ORDER — PENICILLIN G POTASSIUM 5000000 UNITS IJ SOLR
2.5000 10*6.[IU] | INTRAVENOUS | Status: DC
  Administered 2013-05-27 (×3): 2.5 10*6.[IU] via INTRAVENOUS
  Filled 2013-05-26 (×8): qty 2.5

## 2013-05-26 MED ORDER — PENICILLIN G POTASSIUM 5000000 UNITS IJ SOLR
5.0000 10*6.[IU] | Freq: Once | INTRAVENOUS | Status: AC
  Administered 2013-05-27: 5 10*6.[IU] via INTRAVENOUS
  Filled 2013-05-26: qty 5

## 2013-05-26 MED ORDER — ONDANSETRON HCL 4 MG/2ML IJ SOLN: 4.0000 mg | Freq: Four times a day (QID) | INTRAMUSCULAR | Status: DC | PRN

## 2013-05-26 MED ORDER — ZOLPIDEM TARTRATE 5 MG PO TABS: 5.0000 mg | ORAL_TABLET | Freq: Every evening | ORAL | Status: DC | PRN

## 2013-05-26 MED ORDER — LIDOCAINE HCL (PF) 1 % IJ SOLN
30.0000 mL | INTRAMUSCULAR | Status: DC | PRN
  Administered 2013-05-27: 30 mL via SUBCUTANEOUS
  Filled 2013-05-26: qty 30

## 2013-05-26 MED ORDER — IBUPROFEN 600 MG PO TABS
600.0000 mg | ORAL_TABLET | Freq: Four times a day (QID) | ORAL | Status: DC | PRN
  Administered 2013-05-27: 600 mg via ORAL
  Filled 2013-05-26: qty 1

## 2013-05-26 MED ORDER — OXYTOCIN 40 UNITS IN LACTATED RINGERS INFUSION - SIMPLE MED
62.5000 mL/h | INTRAVENOUS | Status: DC
Start: 2013-05-26 — End: 2013-05-28

## 2013-05-26 MED ORDER — OXYTOCIN 40 UNITS IN LACTATED RINGERS INFUSION - SIMPLE MED
1.0000 m[IU]/min | INTRAVENOUS | Status: DC
  Administered 2013-05-27: 5 m[IU]/min via INTRAVENOUS
  Administered 2013-05-27: 2 m[IU]/min via INTRAVENOUS
  Filled 2013-05-26: qty 1000

## 2013-05-26 MED ORDER — OXYTOCIN BOLUS FROM INFUSION
500.0000 mL | INTRAVENOUS | Status: DC
  Administered 2013-05-27: 500 mL via INTRAVENOUS

## 2013-05-26 MED ORDER — MISOPROSTOL 25 MCG QUARTER TABLET
25.0000 ug | ORAL_TABLET | ORAL | Status: DC | PRN
Start: 2013-05-26 — End: 2013-05-28
  Administered 2013-05-26 – 2013-05-27 (×2): 25 ug via VAGINAL
  Filled 2013-05-26 (×2): qty 0.25
  Filled 2013-05-26: qty 1

## 2013-05-26 MED ORDER — LACTATED RINGERS IV SOLN
INTRAVENOUS | Status: DC
  Administered 2013-05-27 (×2): via INTRAVENOUS

## 2013-05-26 NOTE — H&P (Signed)
Monica Munoz is a 23 y.o. female, G1P0 at 3240 6/7 weeks, scheduled for admission on the evening of 05/26/13 for IOL for postdates induction.  Patient Active Problem List   Diagnosis Date Noted  . Anxiety state, unspecified 05/26/2013  . Allergy history, drug - Benadryl 05/26/2013  . Unspecified asthma(493.90) 05/26/2013  . Depression 05/26/2013  . History of GBS (group B streptococcus) UTI, currently pregnant 10/12/2012  . Migraine with aura 04/25/2012    History of present pregnancy: Patient entered care at 8 2/7 weeks.   EDC of 05/20/13 was established by first trimester sono.   Anatomy scan: 18 4/7  weeks, with normal findings and a posterior placenta.   Additional US evaluations: 1) 22 0/7 weeks - 2nd trimester bleeding - posterior placenta, no previa, no abruption, normal fluid, 2) 24 6/7 weeks - fall - normal AFV, no evidence of placenta previa or abruption. Significant prenatal events: GBS bacteriuria at NOB visit treated w/ PCN, flu vaccine 12/17/12, TdAP 03/11/13, 2nd trimester bleeding at 22 wks; normal u/s; treated for cervicitis; cultures neg, fall at 24 6/7 wks; normal u/s    Last evaluation: 05/21/13. Cervix closed, 80%, 0 station and Cat I FHRT.  OB History   Grav Para Term Preterm Abortions TAB SAB Ect Mult Living   1               Past Medical History  Diagnosis Date  . Asthma   . Migraine with aura 04/25/2012  . Depression   . Anxiety    Past Surgical History  Procedure Laterality Date  . Wisdom tooth extraction     Family History: family history includes Autism in her paternal uncle; Down syndrome in her paternal uncle; Heart murmur in her mother; Miscarriages / Stillbirths in her mother. Social History:  reports that she has never smoked. She does not have any smokeless tobacco history on file. She reports that she drinks alcohol. She reports that she does not use illicit drugs. Pt is Caucasian, of the Catholic faith and employed as a Conservation officer, naturecashier.   Prenatal Transfer  Tool  Maternal Diabetes: No Genetic Screening: Normal quad screen Maternal Ultrasounds/Referrals: Normal Fetal Ultrasounds or other Referrals:  None Maternal Substance Abuse:  No Significant Maternal Medications:  Meds include: Other: ProAir HFA 90 mcg inhalation qid prn, Zyrtec 10 mg daily Significant Maternal Lab Results: Lab values include: Group B Strep positive     Allergies  Allergen Reactions  . Benadryl [Diphenhydramine Hcl] Other (See Comments)    Red Man's and Sjogren's  . Diphenhydramine Hcl   . Lorazepam     Patient states that it makes her "loopy" and "off the wall"       Last menstrual period 07/11/2012.  Chest clear Heart RRR without murmur Abd gravid, NT, FH 38 cm Pelvic: 0/80/0 per SR on 05/21/13 Ext: Trace edema  FHR: Reactive NST on 05/21/13 UCs:  Occasional  Prenatal labs: ABO, Rh: A/Positive/-- (09/17 0000) Antibody: Negative (09/17 0000) Rubella:  Immune RPR: Nonreactive (09/17 0000)  HBsAg: Negative (09/17 0000)  HIV: Non-reactive (09/17 0000)  GBS: Positive (09/17 0000) Sickle cell/Hgb electrophoresis: NA Pap:  Deferred due to age GC: Neg on 01/14/13 and 04/24/13 Chlamydia: Neg on 01/14/13 and 04/24/13 Genetic screenings: Neg quad screen  Glucola: Normal  Other: Toxo titer neg. Hgb at NOB on 12.6 and 28-wk Hbg 11.3       Assessment/Plan: IUP at 40 6/7 wks IOL for postdates GBS positive  Plan: Admit for induction due to  postdates. Cytotec and/or Pitocin prn PCN prophylaxis Pain meds prn   Scharlene CornKimberly WilliamsCNM, MS 05/26/2013, 2:24 PM

## 2013-05-26 NOTE — Progress Notes (Signed)
2005: Patient here for IOL.  Discussed POC for tonight and early am.  All questions and concerns addressed.  Fetal tracing non-reactive.  Patient encouraged to have some fluids and reassess in 20 minutes.  Explained rationale to patient, who verbalized understanding.    Subjective: Patient reports active fetus, denies LoF, VB, and ctx.  Family at bedside, supportive.  Objective:      Filed Vitals:   05/26/13 1949  BP: 129/82  Pulse: 94  Temp: 98.6 F (37 C)  Resp: 18     FHT: 150 bpm, Mod Var, -Decels, +Accels UC:   Irregular contractions graphed SVE:     0/50/-1, Firm, Posterior, Vertex Membranes: Intact Pitocin: None Cytotec placed w/o difficulty  Assessment:  IUP at 40.6wks Cat I FT Postdates Pregnancy Cervical Ripening GBS Positive  Plan: -Cytotec for cervical ripening, reassess Q4 or prn -Okay for light labor diet throughout the night -Ambien for sleep, if desired -Start PCN prior to pitocin or once active labor begins -Continue other mgmt as ordered  Gerrit HeckJessica Orli Degrave, CNM 05/26/2013, 8:06 PM

## 2013-05-27 ENCOUNTER — Encounter (HOSPITAL_COMMUNITY): Payer: Medicaid Other | Admitting: Anesthesiology

## 2013-05-27 ENCOUNTER — Inpatient Hospital Stay (HOSPITAL_COMMUNITY): Payer: Medicaid Other | Admitting: Anesthesiology

## 2013-05-27 ENCOUNTER — Encounter (HOSPITAL_COMMUNITY): Payer: Self-pay

## 2013-05-27 LAB — RPR

## 2013-05-27 MED ORDER — BUTORPHANOL TARTRATE 1 MG/ML IJ SOLN
2.0000 mg | INTRAMUSCULAR | Status: DC | PRN
  Administered 2013-05-27: 2 mg via INTRAVENOUS
  Filled 2013-05-27 (×2): qty 2

## 2013-05-27 MED ORDER — PHENYLEPHRINE 40 MCG/ML (10ML) SYRINGE FOR IV PUSH (FOR BLOOD PRESSURE SUPPORT)
80.0000 ug | PREFILLED_SYRINGE | INTRAVENOUS | Status: DC | PRN
  Filled 2013-05-27: qty 2

## 2013-05-27 MED ORDER — LACTATED RINGERS IV SOLN: 500.0000 mL | Freq: Once | INTRAVENOUS | Status: AC

## 2013-05-27 MED ORDER — PHENYLEPHRINE 40 MCG/ML (10ML) SYRINGE FOR IV PUSH (FOR BLOOD PRESSURE SUPPORT)
80.0000 ug | PREFILLED_SYRINGE | INTRAVENOUS | Status: DC | PRN
  Filled 2013-05-27: qty 2
  Filled 2013-05-27: qty 10

## 2013-05-27 MED ORDER — EPHEDRINE 5 MG/ML INJ
10.0000 mg | INTRAVENOUS | Status: DC | PRN
  Filled 2013-05-27: qty 2
  Filled 2013-05-27: qty 4

## 2013-05-27 MED ORDER — DIPHENHYDRAMINE HCL 50 MG/ML IJ SOLN: 12.5000 mg | INTRAMUSCULAR | Status: DC | PRN

## 2013-05-27 MED ORDER — PROMETHAZINE HCL 25 MG/ML IJ SOLN
12.5000 mg | INTRAMUSCULAR | Status: DC | PRN
  Administered 2013-05-27 (×2): 12.5 mg via INTRAVENOUS
  Filled 2013-05-27 (×2): qty 1

## 2013-05-27 MED ORDER — LIDOCAINE HCL (PF) 1 % IJ SOLN
INTRAMUSCULAR | Status: DC | PRN
Start: 2013-05-27 — End: 2013-05-28
  Administered 2013-05-27 (×2): 5 mL

## 2013-05-27 MED ORDER — EPHEDRINE 5 MG/ML INJ
10.0000 mg | INTRAVENOUS | Status: DC | PRN
  Filled 2013-05-27: qty 2

## 2013-05-27 MED ORDER — BUTORPHANOL TARTRATE 1 MG/ML IJ SOLN
2.0000 mg | Freq: Once | INTRAMUSCULAR | Status: AC
  Administered 2013-05-27: 2 mg via INTRAVENOUS
  Filled 2013-05-27: qty 2

## 2013-05-27 MED ORDER — BUTORPHANOL TARTRATE 1 MG/ML IJ SOLN
1.0000 mg | INTRAMUSCULAR | Status: DC | PRN
  Administered 2013-05-27 (×2): 1 mg via INTRAVENOUS
  Filled 2013-05-27 (×2): qty 1

## 2013-05-27 MED ORDER — FENTANYL 2.5 MCG/ML BUPIVACAINE 1/10 % EPIDURAL INFUSION (WH - ANES)
14.0000 mL/h | INTRAMUSCULAR | Status: DC | PRN
  Administered 2013-05-27: 14 mL/h via EPIDURAL
  Filled 2013-05-27: qty 125

## 2013-05-27 NOTE — Progress Notes (Signed)
  Subjective: Crying - desiring more pain medication and/or epidural - refusing cervical exam   Objective: BP 110/64  Pulse 80  Temp(Src) 98.5 F (36.9 C) (Oral)  Resp 18  Ht 5\' 5"  (1.651 m)  Wt 168 lb (76.204 kg)  BMI 27.96 kg/m2  LMP 07/11/2012      FHT: Category 1; +accels, no decels UC:   irregular, every 2-4 minutes SVE:   Dilation: 3 Effacement (%): 100 Station: -1 Exam by:: v. latham cnm Pitocin at 8 mius/min  Assessment:   23 yo G1P0 @ 41 0/7 wks IOL for postdates Early labor GBS positive  Plan:  Discussed the need for cervical exam in order to proceed with providing additional pain medication. Pt. agreed to exam - desires epidural at this time   Continue w/ Pitocin    Monica Munoz CNM, MS 05/27/2013, 4:54 PM

## 2013-05-27 NOTE — Progress Notes (Signed)
  Subjective: Feeling ctxs more, requests more pain medication and cervical exam.  Had extensive discussion w/ pt in re: plan of care and visitors in room. Pt elects to have mother and co-worker present for labor and delivery.  Objective: BP 126/85  Pulse 83  Temp(Src) 98.3 F (36.8 C) (Oral)  Resp 18  Ht 5\' 5"  (1.651 m)  Wt 168 lb (76.204 kg)  BMI 27.96 kg/m2  LMP 07/11/2012      FHT: Category 1 UC:   irregular SVE:   Dilation: Closed Effacement (%): 50 Station: -1 Exam by:: Milen Lengacher cnm Cervix soft, posterior   Assessment:   23 yo G1P0 @ 41 0/7 wks here for IOL for postdates GBS positive  Plan:  Stadol/Phenergan  Begin Pitocin augmentation Continue IP prophylaxis for + GBS Re-evaluate in 2 hrs, sooner if indicated Discussed pt's wishes w/ current visitors. CNM requested female non family member/partner to leave per pt's request  Sherre ScarletKimberly Raad Clayson CNM, MS 05/27/2013, 10:08 AM

## 2013-05-27 NOTE — Progress Notes (Signed)
Patient resting in bed with mother.  Reporting pain with contractions.  States that IV pain medication helped initially. Informed that oncoming provider would perform SVE and start pitocin if necessary.  Patient verbalized understanding.  No questions or concerns at current.  Report given to K. Mayford KnifeWilliams, CNM.  Gerrit HeckJessica Landrey Mahurin, CNM, MSN

## 2013-05-27 NOTE — Progress Notes (Signed)
  Subjective:  Ctxs becoming more unbearable, requests more IV pain medication. Considering epidural when in active labor.  Objective: BP 102/50  Pulse 94  Temp(Src) 98.5 F (36.9 C) (Oral)  Resp 18  Ht 5\' 5"  (1.651 m)  Wt 168 lb (76.204 kg)  BMI 27.96 kg/m2  LMP 07/11/2012      FHT: Category 1 UC:   irregular SVE:   Closed/soft/posterior, -1 Pitocin at 6 mius/min  Assessment:   23 yo G1P0 @ 41 0/7 wks IOL for post-dates Latent phase of labor GBS positive   Plan:  Pain meds q 3-4 hrs prn for now  Continue current plan  Pt upset that her cervix has not changed, stating the process has gone on long enough.  States the Pitocin is not working and she does not want to be checked to only hear that she has not made any change, and because the exams are painful. States she wants all of her pain to go away.  Reiterated to pt that no medication will fully take her pain away, and that the IV pain medication allows her to rest between contractions. Reinforced that inductions can take anywhere from 1-3 days. Further explained that she was admitted last evening for cervical ripening and has only been on Pitocin for 5 hours.   Advised her to try to rest between ctxs to allow the process of labor to occur.  Re-evaluate in 2 hrs, sooner if indicated.  Consult prn.    Sherre ScarletKimberly Ashari Llewellyn CNM, MS 05/27/2013, 1:35 PM

## 2013-05-27 NOTE — Progress Notes (Signed)
  Subjective: Feeling ctxs, some stronger than others, states IV Stadol not helpful. Reports active fetus. Denies VB and LOF.  Objective: BP 106/49  Pulse 66  Temp(Src) 98.3 F (36.8 C) (Oral)  Resp 18  Ht 5\' 5"  (1.651 m)  Wt 168 lb (76.204 kg)  BMI 27.96 kg/m2  LMP 07/11/2012      FHT: Category 1 UC:   Mild, irregular SVE:   Deferred  Assessment:  23 yo G1P0 @ 41 0/7 wks c/w 8 2/7 wk sono GBS positive  Plan: Regular diet Evaluate cervix in 1-2 hrs to determine continued mode of induction Discussed the possible need for serial induction as it may not happen today All questions answered to pt and family's satisfaction Pt may choose Epidural as labor advances Continue PCN prophylaxis for GBS positive status  Sherre ScarletKimberly Shamara Soza CNM, MS 05/27/2013, 8:05 AM

## 2013-05-27 NOTE — Progress Notes (Signed)
  Subjective: Patient resting in bed.  Reports minimal discomfort with contractions in lower abdomen.    Objective: BP 129/82  Pulse 94  Temp(Src) 98.6 F (37 C) (Oral)  Resp 18  Ht 5\' 5"  (1.651 m)  Wt 168 lb (76.204 kg)  BMI 27.96 kg/m2  LMP 07/11/2012     FHT: 140 bpm, Mod Var, -Decels, +Accels UC:   Q 2-4 minutes, mild to palpation SVE:  Deferred at current Membranes: Intact Pitocin: N/A  Assessment:  IUP at 40.6wks Cat I FT Postdates Cervical Ripening    Plan: -Discussed current contraction pattern and fetal tracing -Will continue to monitor and reassess for additional cytotec, prn -SVE in 2 hrs or prn if contraction pattern changes   Gerrit HeckJessica Brandi Tomlinson, CNM 05/27/2013, 12:30 AM

## 2013-05-27 NOTE — Anesthesia Preprocedure Evaluation (Signed)
Anesthesia Evaluation  Patient identified by MRN, date of birth, ID band Patient awake    Reviewed: Allergy & Precautions, H&P , Patient's Chart, lab work & pertinent test results  Airway Mallampati: II TM Distance: >3 FB Neck ROM: full    Dental   Pulmonary asthma ,  breath sounds clear to auscultation        Cardiovascular Rhythm:regular Rate:Normal     Neuro/Psych  Headaches,    GI/Hepatic   Endo/Other    Renal/GU      Musculoskeletal   Abdominal   Peds  Hematology   Anesthesia Other Findings   Reproductive/Obstetrics (+) Pregnancy                           Anesthesia Physical Anesthesia Plan  ASA: II  Anesthesia Plan: Epidural   Post-op Pain Management:    Induction:   Airway Management Planned:   Additional Equipment:   Intra-op Plan:   Post-operative Plan:   Informed Consent: I have reviewed the patients History and Physical, chart, labs and discussed the procedure including the risks, benefits and alternatives for the proposed anesthesia with the patient or authorized representative who has indicated his/her understanding and acceptance.     Plan Discussed with:   Anesthesia Plan Comments:         Anesthesia Quick Evaluation

## 2013-05-27 NOTE — Progress Notes (Signed)
  Subjective: Patient in bed, awake on phone.  Continues to report lower abdominal pain.  Family remains at bedside.   Objective: BP 111/53  Pulse 67  Temp(Src) 98.3 F (36.8 C) (Oral)  Resp 18  Ht 5\' 5"  (1.651 m)  Wt 168 lb (76.204 kg)  BMI 27.96 kg/m2  LMP 07/11/2012      FHT:  150 bpm, Mod Var, -Decels, +Accels UC: Infrequent, mild to palpation SVE:   Dilation: Closed Effacement (%): 50 Station: -1 Exam by:: Gerrit HeckJessica Anahlia Iseminger CNM Soft, Posterior Membranes: Intact Pitocin: N/A 2nd Dose Cytotec placed  Assessment:  IUP at 41wks Cat I FT  Postdates Cervical Ripening  Plan: -Discussed contraction pattern and fetal tracing -2nd dose Cytotec placed -Patient encouraged to try to sleep/rest -Continue to monitor and reassess prn -Will start pitocin at 6:30 am -Continue other mgmt as ordered  Barbaraann BoysJessica Nairi Oswald,CNM, MSN 05/27/2013, 2:39 AM

## 2013-05-27 NOTE — Progress Notes (Addendum)
  Subjective:  Feeling urge to push  Objective: BP 117/75  Pulse 121  Temp(Src) 98.5 F (36.9 C) (Oral)  Resp 18  Ht 5\' 5"  (1.651 m)  Wt 168 lb (76.204 kg)  BMI 27.96 kg/m2  SpO2 99%  LMP 07/11/2012     FHT: Category II - +accels, early decels, no late decels  UC:  every 4-5 minutes, palpate moderate SVE: C/C/+1 per VL SROM, clear fluid Pitocin at 4 mius  Assessment:   23 yo G1P0 @ 41 0/7 wks 2nd stage labor GBS positive Hx of anxiety/depression; no meds this pregnancy  Plan:  No fetal descent w/ trial push, but poor effort; will allow passive descent and await increased urge to push Consult as indicated Expect progress and SVD  Sherre ScarletKimberly Danyell Shader CNM, MS 05/27/2013, 6:44 PM

## 2013-05-27 NOTE — Anesthesia Procedure Notes (Signed)
Epidural Patient location during procedure: OB Start time: 05/27/2013 5:17 PM  Staffing Anesthesiologist: Brayton CavesJACKSON, Branden Vine Performed by: anesthesiologist   Preanesthetic Checklist Completed: patient identified, site marked, surgical consent, pre-op evaluation, timeout performed, IV checked, risks and benefits discussed and monitors and equipment checked  Epidural Patient position: sitting Prep: site prepped and draped and DuraPrep Patient monitoring: continuous pulse ox and blood pressure Approach: midline Location: L3-L4 Injection technique: LOR air  Needle:  Needle type: Tuohy  Needle gauge: 17 G Needle length: 9 cm and 9 Needle insertion depth: 5 cm cm Catheter type: closed end flexible Catheter size: 19 Gauge Catheter at skin depth: 10 cm Test dose: negative  Assessment Events: blood not aspirated, injection not painful, no injection resistance, negative IV test and no paresthesia  Additional Notes Patient identified.  Risk benefits discussed including failed block, incomplete pain control, headache, nerve damage, paralysis, blood pressure changes, nausea, vomiting, reactions to medication both toxic or allergic, and postpartum back pain.  Patient expressed understanding and wished to proceed.  All questions were answered.  Sterile technique used throughout procedure and epidural site dressed with sterile barrier dressing. No paresthesia or other complications noted.The patient did not experience any signs of intravascular injection such as tinnitus or metallic taste in mouth nor signs of intrathecal spread such as rapid motor block. Please see nursing notes for vital signs.

## 2013-05-27 NOTE — Progress Notes (Signed)
  Subjective:  Sleeping, yet easily aroused. Stadol/Phenergan combo effective. Mom and friend at bedside and remain supportive  Objective: BP 121/79  Pulse 83  Temp(Src) 98.5 F (36.9 C) (Oral)  Resp 18  Ht 5\' 5"  (1.651 m)  Wt 168 lb (76.204 kg)  BMI 27.96 kg/m2  LMP 07/11/2012      FHT: Category 1 UC:   irregular, every 8 minutes SVE: Deferred    Pitocin at 4 mius/min  Assessment:   23 yo G1P0 @ 41 0/7 wks IOL for post-dates GBS positive  Plan:  Continue current plan Consult as indicated Expect progress  Sherre ScarletKimberly Jerett Odonohue CNM, MS 05/27/2013, 12:18 PM

## 2013-05-28 ENCOUNTER — Encounter (HOSPITAL_COMMUNITY): Payer: Self-pay

## 2013-05-28 LAB — CBC
HCT: 26.1 % — ABNORMAL LOW (ref 36.0–46.0)
Hemoglobin: 8.8 g/dL — ABNORMAL LOW (ref 12.0–15.0)
MCH: 32.4 pg (ref 26.0–34.0)
MCHC: 33.7 g/dL (ref 30.0–36.0)
MCV: 96 fL (ref 78.0–100.0)
Platelets: 219 10*3/uL (ref 150–400)
RBC: 2.72 MIL/uL — ABNORMAL LOW (ref 3.87–5.11)
RDW: 13 % (ref 11.5–15.5)
WBC: 20.5 10*3/uL — ABNORMAL HIGH (ref 4.0–10.5)

## 2013-05-28 LAB — CCBB MATERNAL DONOR DRAW

## 2013-05-28 MED ORDER — FLEET ENEMA 7-19 GM/118ML RE ENEM: 1.0000 | ENEMA | Freq: Every day | RECTAL | Status: DC | PRN

## 2013-05-28 MED ORDER — ONDANSETRON HCL 4 MG PO TABS
4.0000 mg | ORAL_TABLET | ORAL | Status: DC | PRN
  Administered 2013-05-29: 4 mg via ORAL
  Filled 2013-05-28: qty 1

## 2013-05-28 MED ORDER — DIBUCAINE 1 % RE OINT: 1.0000 "application " | TOPICAL_OINTMENT | RECTAL | Status: DC | PRN

## 2013-05-28 MED ORDER — MEASLES, MUMPS & RUBELLA VAC ~~LOC~~ INJ
0.5000 mL | INJECTION | Freq: Once | SUBCUTANEOUS | Status: DC
Start: 2013-05-29 — End: 2013-05-28

## 2013-05-28 MED ORDER — BENZOCAINE-MENTHOL 20-0.5 % EX AERO
1.0000 "application " | INHALATION_SPRAY | CUTANEOUS | Status: DC | PRN
  Administered 2013-05-29: 1 via TOPICAL
  Filled 2013-05-28 (×2): qty 56

## 2013-05-28 MED ORDER — TETANUS-DIPHTH-ACELL PERTUSSIS 5-2.5-18.5 LF-MCG/0.5 IM SUSP: 0.5000 mL | Freq: Once | INTRAMUSCULAR | Status: DC

## 2013-05-28 MED ORDER — WITCH HAZEL-GLYCERIN EX PADS: 1.0000 "application " | MEDICATED_PAD | CUTANEOUS | Status: DC | PRN

## 2013-05-28 MED ORDER — OXYCODONE-ACETAMINOPHEN 5-325 MG PO TABS
1.0000 | ORAL_TABLET | ORAL | Status: DC | PRN
  Administered 2013-05-28: 2 via ORAL
  Administered 2013-05-28: 1 via ORAL
  Filled 2013-05-28: qty 2
  Filled 2013-05-28: qty 1

## 2013-05-28 MED ORDER — SENNOSIDES-DOCUSATE SODIUM 8.6-50 MG PO TABS
2.0000 | ORAL_TABLET | ORAL | Status: DC
  Filled 2013-05-28: qty 2

## 2013-05-28 MED ORDER — ZOLPIDEM TARTRATE 5 MG PO TABS: 5.0000 mg | ORAL_TABLET | Freq: Every evening | ORAL | Status: DC | PRN

## 2013-05-28 MED ORDER — FERROUS SULFATE 325 (65 FE) MG PO TABS
325.0000 mg | ORAL_TABLET | Freq: Two times a day (BID) | ORAL | Status: DC
  Administered 2013-05-28 – 2013-05-29 (×2): 325 mg via ORAL
  Filled 2013-05-28 (×2): qty 1

## 2013-05-28 MED ORDER — SIMETHICONE 80 MG PO CHEW
80.0000 mg | CHEWABLE_TABLET | ORAL | Status: DC | PRN
  Administered 2013-05-28: 80 mg via ORAL
  Filled 2013-05-28: qty 1

## 2013-05-28 MED ORDER — PRENATAL MULTIVITAMIN CH
1.0000 | ORAL_TABLET | Freq: Every day | ORAL | Status: DC
  Administered 2013-05-28: 1 via ORAL
  Filled 2013-05-28: qty 1

## 2013-05-28 MED ORDER — LANOLIN HYDROUS EX OINT: TOPICAL_OINTMENT | CUTANEOUS | Status: DC | PRN

## 2013-05-28 MED ORDER — MEDROXYPROGESTERONE ACETATE 150 MG/ML IM SUSP: 150.0000 mg | INTRAMUSCULAR | Status: DC | PRN

## 2013-05-28 MED ORDER — IBUPROFEN 600 MG PO TABS
600.0000 mg | ORAL_TABLET | Freq: Four times a day (QID) | ORAL | Status: DC
  Administered 2013-05-28 – 2013-05-29 (×4): 600 mg via ORAL
  Filled 2013-05-28 (×4): qty 1

## 2013-05-28 MED ORDER — ALBUTEROL SULFATE (2.5 MG/3ML) 0.083% IN NEBU: 3.0000 mL | INHALATION_SOLUTION | Freq: Four times a day (QID) | RESPIRATORY_TRACT | Status: DC | PRN

## 2013-05-28 MED ORDER — BISACODYL 10 MG RE SUPP: 10.0000 mg | Freq: Every day | RECTAL | Status: DC | PRN

## 2013-05-28 MED ORDER — ONDANSETRON HCL 4 MG/2ML IJ SOLN: 4.0000 mg | INTRAMUSCULAR | Status: DC | PRN

## 2013-05-28 NOTE — Anesthesia Postprocedure Evaluation (Signed)
  Anesthesia Post-op Note  Patient: Monica Munoz  Procedure(s) Performed: * No procedures listed *  Patient Location: Mother/Baby  Anesthesia Type:Epidural  Level of Consciousness: awake  Airway and Oxygen Therapy: Patient Spontanous Breathing  Post-op Pain: mild  Post-op Assessment: Patient's Cardiovascular Status Stable and Respiratory Function Stable  Post-op Vital Signs: stable  Last Vitals:  Filed Vitals:   05/28/13 0515  BP: 104/62  Pulse: 106  Temp: 36.8 C  Resp: 18    Complications: No apparent anesthesia complications

## 2013-05-28 NOTE — Progress Notes (Signed)
Pt states she does not feel like she needs to void..has never felt that urge since the epidural.  Bladder scan showed in her bladder.  Helped pt to bathroom. Was able to void moderate amount with just squirting warm water over her perineal area.  Bladder scanned her again.  Only 57mL in her bladder.  Encouraged patient to try to void every 3-4 hours whether she feels the urge or not.

## 2013-05-28 NOTE — Progress Notes (Signed)
Subjective: Postpartum Day 1: Vaginal delivery, 2nd degree perineal and right labial lacerations. Had female infant - pt does not desire circumcision. Patient up ad lib, reports no syncope or dizziness. Feeding: Bottle Contraceptive plan: Depo  Pt w/ h/o anxiety and depression; no meds during pregnancy  Objective: Vital signs in last 24 hours: Temp:  [98.1 F (36.7 C)-99 F (37.2 C)] 98.3 F (36.8 C) (05/13 0515) Pulse Rate:  [57-132] 106 (05/13 0515) Resp:  [18-20] 18 (05/13 0515) BP: (88-126)/(34-90) 104/62 mmHg (05/13 0515) SpO2:  [97 %-100 %] 99 % (05/12 2015)  Physical Exam:  General: alert and in good spirits; bonding well w/ baby; good family support. Lochia: appropriate Uterine Fundus: firm Perineum: healing well DVT Evaluation: No evidence of DVT seen on physical exam. Negative Homan's sign.    Recent Labs  05/26/13 1955 05/28/13 0550  HGB 11.9* 8.8*  HCT 34.4* 26.1*    Assessment/Plan: Status post vaginal delivery day 1. Anemia w/ hemodynamic stability; H/H 8.8/26.1. Stable. Reviewed blood transfusion indications/protocol. H/O anxiety/depression - no signs of pp depression at this time. Ferrous Sulfate bid. Continue current care. Plan for discharge tomorrow.  Monica Munoz 05/28/2013, 12:01 PM

## 2013-05-29 MED ORDER — OXYCODONE-ACETAMINOPHEN 5-325 MG PO TABS: 1.0000 | ORAL_TABLET | ORAL | Status: DC | PRN

## 2013-05-29 MED ORDER — IBUPROFEN 600 MG PO TABS: 600.0000 mg | ORAL_TABLET | Freq: Four times a day (QID) | ORAL | Status: DC | PRN

## 2013-05-29 MED ORDER — MEDROXYPROGESTERONE ACETATE 150 MG/ML IM SUSP
150.0000 mg | Freq: Once | INTRAMUSCULAR | Status: AC
  Administered 2013-05-29: 150 mg via INTRAMUSCULAR
  Filled 2013-05-29: qty 1

## 2013-05-29 NOTE — Discharge Summary (Signed)
Vaginal Delivery Discharge Summary  Monica Munoz  DOB:    [birth year] MRN:    [id number] CSN:    [id number]  Date of admission:                  5/11/1/5  Date of discharge:                   05/29/13  Procedures this admission:   Vaginal delivery, repair of 2nd degree perineal and right labial lacerations  Date of Delivery: 05/27/13  Newborn Data:  Live born female  Birth Weight: 8 lb 10.3 oz (3920 g) APGAR: 9, 9  Home with mother. Name: Sheria LangCameron Circumcision Plan: Declined  History of Present Illness:  Ms. Monica Dunkori Servais is a 23 y.o. female, G1P1001, who presents at 6787w0d weeks gestation. The patient [facility]. She was admitted induction of labor for post-dates. Her pregnancy has been complicated by:  Patient Active Problem List   Diagnosis Date Noted  . Vaginal delivery 05/29/2013  . Anxiety state, unspecified 05/26/2013  . Allergy history, drug - Benadryl 05/26/2013  . Unspecified asthma(493.90) 05/26/2013  . Depression 05/26/2013  . History of GBS (group B streptococcus) UTI, currently pregnant 10/12/2012  . Migraine with aura 04/25/2012  . 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 Headache, migraine 01/29/2012     Hospital Course:  Admitted 05/26/13 for induction due to post-dates. Positive GBS. Progressed with 2 doses of cytotech, then pitocin induction. Utilized IV meds and epidural for pain management.  Delivery was performed by Sanda KleinShelley Lillard, CNM, without complication. Patient and baby tolerated the procedure without difficulty, with  2nd degree perineal and right labial lacerations noted. Infant status was stable and remained in room with mother.  Mother and infant then had an uncomplicated postpartum course, with breast and bottle feeding going well. Mom's physical exam was WNL, and she was discharged home in stable condition. Contraception plan was DepoProvera, with 1st dose  administered on the day of discharge (previous Depo user).  She received adequate benefit from po pain medications.  Postpartum Hgb was 8.8 (pre-delivery 11.4), with no hemodynamic instability.   Feeding:  breast and bottle  Contraception:  Depo-Provera  Discharge hemoglobin:  Hemoglobin  Date Value Ref Range Status  05/28/2013 8.8* 12.0 - 15.0 g/dL Final     REPEATED TO VERIFY     DELTA CHECK NOTED     HCT  Date Value Ref Range Status  05/28/2013 26.1* 36.0 - 46.0 % Final  Pre delivery Hgb 11.9.  Discharge Physical Exam:   General: alert Lochia: appropriate Uterine Fundus: firm Incision: healing well DVT Evaluation: No evidence of DVT seen on physical exam. Negative Homan's sign.  Intrapartum Procedures: spontaneous vaginal delivery Postpartum Procedures: none Complications-Operative and Postpartum: 2nd degree perineal laceration, right labial laceration.  Discharge Diagnoses: Term Pregnancy-delivered, post-dates induction, + GBS, anemia without hemodynamic instability.  Discharge Information:  Activity:           pelvic rest Diet:                routine Medications: Ibuprofen and Percocet, with Depo administered at discharge.  OTC Fe recommended. Condition:      stable Instructions:     Discharge to: home  Follow-up Information   [facility]. Schedule an appointment as soon as possible for a visit in 6 weeks. (Call for any questions or concerns)  Specialty:  Obstetrics and Gynecology   Contact information:   [address] [place] [id number] [phone]       Nigel BridgemanVicki Armend Hochstatter CNM, [place] 05/29/2013 7:27 AM

## 2013-05-29 NOTE — Clinical Social Work Maternal (Signed)
Clinical Social Work Department PSYCHOSOCIAL ASSESSMENT - MATERNAL/CHILD 05/29/2013  Patient:  Monica Munoz,Monica Munoz  Account Number:  [id number]  Admit Date:  05/26/2013  Marjo Bickerhilds Name:   Sheria Langameron [facility]os [facility]    Clinical Social Worker:  Vennie HomansELIZABETH GRIER Jaslynne Dahan, [place]   Date/Time:  05/29/2013 10:00 AM  Date Referred:  05/29/2013   Referral source  CN     Referred reason  Depression/Anxiety   Other referral source:    I:  FAMILY / HOME ENVIRONMENT Child's legal guardian:  PARENT  Guardian - Name Guardian - Age Guardian - Address  Deloyce Wheatley [address] [place], [place]   Other household support members/support persons Name Relationship DOB  SHERRY LEE Obar MOTHER    Other support:   Friends and family, FOB not involved    II  PSYCHOSOCIAL DATA Information Source:  Patient Interview  Event organiserinancial and Community Resources Employment:   works part time  gets support from [employer]   Financial resources:  [employer]icaid If [place] [place]icaid - [place]:  [place] Other  [facility]   School / Grade:   Maternity Care Coordinator / Child Services Coordination / Early Interventions:   [name].  Cultural issues impacting care:   none noted    III  STRENGTHS Strengths  Adequate Resources  Compliance with medical plan  Home prepared for Child (including basic supplies)  Supportive family/friends   Strength comment:    IV  RISK FACTORS AND CURRENT PROBLEMS Current Problem:  YES   Risk Factor & Current Problem Patient Issue Family Issue Risk Factor / Current Problem Comment  Mental Illness Y N bipolar disorder, not on meds currently    V  SOCIAL WORK ASSESSMENT [name] has bipolar disorder and issues with anxiety and depression. [facility] and [facility]. CSW mentioned resources of [facility] that they offer as well. [name] very  tired, but bonding well with infant and has good support from [employer] whom she lives with. FOB not involved currently. [name] has good plan and support to assist her.      VI SOCIAL WORK PLAN Social Work Plan  Information/Referral to [employer] Resources  Patient/Family Education   Type of pt/family education:   Additional resources to assist   If child protective services report - county:   If child protective services report - date:   Information/referral to community resources comment:   Other social work plan:   No other needs noted.    Doreen SalvageGrier Porfiria Heinrich, LCSW Clinical Social Worker [facility] Wednesday, Thursday and Friday Phone: [phone] Fax: [phone]  Covering for Nobie Putnamedra Slade

## 2013-05-29 NOTE — Discharge Instructions (Signed)
Postpartum Care After Vaginal Delivery °After you deliver your newborn (postpartum period), the usual stay in the hospital is 24 72 hours. If there were problems with your labor or delivery, or if you have other medical problems, you might be in the hospital longer.  °While you are in the hospital, you will receive help and instructions on how to care for yourself and your newborn during the postpartum period.  °While you are in the hospital: °· Be sure to tell your nurses if you have pain or discomfort, as well as where you feel the pain and what makes the pain worse. °· If you had an incision made near your vagina (episiotomy) or if you had some tearing during delivery, the nurses may put ice packs on your episiotomy or tear. The ice packs may help to reduce the pain and swelling. °· If you are breastfeeding, you may feel uncomfortable contractions of your uterus for a couple of weeks. This is normal. The contractions help your uterus get back to normal size. °· It is normal to have some bleeding after delivery. °· For the first 1 3 days after delivery, the flow is red and the amount may be similar to a period. °· It is common for the flow to start and stop. °· In the first few days, you may pass some small clots. Let your nurses know if you begin to pass large clots or your flow increases. °· Do not  flush blood clots down the toilet before having the nurse look at them. °· During the next 3 10 days after delivery, your flow should become more watery and pink or brown-tinged in color. °· Ten to fourteen days after delivery, your flow should be a small amount of yellowish-white discharge. °· The amount of your flow will decrease over the first few weeks after delivery. Your flow may stop in 6 8 weeks. Most women have had their flow stop by 12 weeks after delivery. °· You should change your sanitary pads frequently. °· Wash your hands thoroughly with soap and water for at least 20 seconds after changing pads, using  the toilet, or before holding or feeding your newborn. °· You should feel like you need to empty your bladder within the first 6 8 hours after delivery. °· In case you become weak, lightheaded, or faint, call your nurse before you get out of bed for the first time and before you take a shower for the first time. °· Within the first few days after delivery, your breasts may begin to feel tender and full. This is called engorgement. Breast tenderness usually goes away within 48 72 hours after engorgement occurs. You may also notice milk leaking from your breasts. If you are not breastfeeding, do not stimulate your breasts. Breast stimulation can make your breasts produce more milk. °· Spending as much time as possible with your newborn is very important. During this time, you and your newborn can feel close and get to know each other. Having your newborn stay in your room (rooming in) will help to strengthen the bond with your newborn.  It will give you time to get to know your newborn and become comfortable caring for your newborn. °· Your hormones change after delivery. Sometimes the hormone changes can temporarily cause you to feel sad or tearful. These feelings should not last more than a few days. If these feelings last longer than that, you should talk to your caregiver. °· If desired, talk to your caregiver about   methods of family planning or contraception.  Talk to your caregiver about immunizations. Your caregiver may want you to have the following immunizations before leaving the hospital:  Tetanus, diphtheria, and pertussis (Tdap) or tetanus and diphtheria (Td) immunization. It is very important that you and your family (including grandparents) or others caring for your newborn are up-to-date with the Tdap or Td immunizations. The Tdap or Td immunization can help protect your newborn from getting ill.  Rubella immunization.  Varicella (chickenpox) immunization.  Influenza immunization. You should  receive this annual immunization if you did not receive the immunization during your pregnancy. Document Released: 10/30/2006 Document Revised: 09/27/2011 Document Reviewed: 08/30/2011 [facility] Patient Information 2014 [place], [place].  Anemia, Nonspecific Anemia is a condition in which the concentration of red blood cells or hemoglobin in the blood is below normal. Hemoglobin is a substance in red blood cells that carries oxygen to the tissues of the body. Anemia results in not enough oxygen reaching these tissues.  CAUSES  Common causes of anemia include:   Excessive bleeding. Bleeding may be internal or external. This includes excessive bleeding from periods (in women) or from the intestine.   Poor nutrition.   Chronic kidney, thyroid, and liver disease.  Bone marrow disorders that decrease red blood cell production.  Cancer and treatments for cancer.  HIV, AIDS, and their treatments.  Spleen problems that increase red blood cell destruction.  Blood disorders.  Excess destruction of red blood cells due to infection, medicines, and autoimmune disorders. SIGNS AND SYMPTOMS   Minor weakness.   Dizziness.   Headache.  Palpitations.   Shortness of breath, especially with exercise.   Paleness.  Cold sensitivity.  Indigestion.  Nausea.  Difficulty sleeping.  Difficulty concentrating. Symptoms may occur suddenly or they may develop slowly.  DIAGNOSIS  Additional blood tests are often needed. These help your health care provider determine the best treatment. Your health care provider will check your stool for blood and look for other causes of blood loss.  TREATMENT  Treatment varies depending on the cause of the anemia. Treatment can include:   Supplements of iron, vitamin B12, or folic acid.   Hormone medicines.   A blood transfusion. This may be needed if blood loss is severe.   Hospitalization. This may be needed if there is significant continual  blood loss.   Dietary changes.  Spleen removal. HOME CARE INSTRUCTIONS Keep all follow-up appointments. It often takes many weeks to correct anemia, and having your health care provider check on your condition and your response to treatment is very important. SEEK IMMEDIATE MEDICAL CARE IF:   You develop extreme weakness, shortness of breath, or chest pain.   You become dizzy or have trouble concentrating.  You develop heavy vaginal bleeding.   You develop a rash.   You have bloody or black, tarry stools.   You faint.   You vomit up blood.   You vomit repeatedly.   You have abdominal pain.  You have a fever or persistent symptoms for more than 2 3 days.   You have a fever and your symptoms suddenly get worse.   You are dehydrated.  MAKE SURE YOU:  Understand these instructions.  Will watch your condition.  Will get help right away if you are not doing well or get worse. Document Released: 02/10/2004 Document Revised: 09/04/2012 Document Reviewed: 06/28/2012 [facility] Patient Information 2014 [place]ExitCare, [place].

## 2013-05-29 NOTE — Progress Notes (Signed)
Informed pt with discharge instructions to call MD if painful urination or burning progresses once going home. Pt states "it feels like my stiches" right now.

## 2013-11-01 ENCOUNTER — Emergency Department (HOSPITAL_COMMUNITY)
Admission: EM | Admit: 2013-11-01 | Discharge: 2013-11-01 | Disposition: A | Discharge status: Home / Self Care | Payer: Medicaid Other | Attending: Emergency Medicine | Admitting: Emergency Medicine

## 2013-11-01 ENCOUNTER — Encounter (HOSPITAL_COMMUNITY): Payer: Self-pay | Admitting: Emergency Medicine

## 2013-11-01 DIAGNOSIS — J45909 Unspecified asthma, uncomplicated: Secondary | ICD-10-CM | POA: Diagnosis not present

## 2013-11-01 DIAGNOSIS — Y9389 Activity, other specified: Secondary | ICD-10-CM | POA: Insufficient documentation

## 2013-11-01 DIAGNOSIS — Z8679 Personal history of other diseases of the circulatory system: Secondary | ICD-10-CM | POA: Insufficient documentation

## 2013-11-01 DIAGNOSIS — Z8659 Personal history of other mental and behavioral disorders: Secondary | ICD-10-CM | POA: Insufficient documentation

## 2013-11-01 DIAGNOSIS — Z79899 Other long term (current) drug therapy: Secondary | ICD-10-CM | POA: Insufficient documentation

## 2013-11-01 DIAGNOSIS — Y92410 Unspecified street and highway as the place of occurrence of the external cause: Secondary | ICD-10-CM | POA: Diagnosis not present

## 2013-11-01 DIAGNOSIS — S39012A Strain of muscle, fascia and tendon of lower back, initial encounter: Secondary | ICD-10-CM | POA: Diagnosis not present

## 2013-11-01 DIAGNOSIS — S3992XA Unspecified injury of lower back, initial encounter: Secondary | ICD-10-CM | POA: Diagnosis present

## 2013-11-01 MED ORDER — CYCLOBENZAPRINE HCL 10 MG PO TABS: 5.0000 mg | ORAL_TABLET | Freq: Three times a day (TID) | ORAL | Status: DC | PRN

## 2013-11-01 MED ORDER — NAPROXEN 500 MG PO TABS: 500.0000 mg | ORAL_TABLET | Freq: Two times a day (BID) | ORAL | Status: DC
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
## 2013-11-01 NOTE — ED Notes (Signed)
pts vital signs updated pt awaiting discharge paperwork at bedside.  
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
## 2013-11-02 NOTE — ED Provider Notes (Signed)
Medical screening examination/treatment/procedure(s) were performed by non-physician practitioner and as supervising physician I was immediately available for consultation/collaboration.  Flint MelterElliott L Sire Poet, MD 11/02/13 [phone]

## 2013-11-17 ENCOUNTER — Encounter (HOSPITAL_COMMUNITY): Payer: Self-pay | Admitting: Emergency Medicine

## 2014-01-16 NOTE — L&D Delivery Note (Signed)
Patient is 24 y.o. [id number] [redacted]w[redacted]d admitted SOL, hx of asthma. Had protracted labor so was augmented with pitocin. Had SROM and immediately felt pressure and need to push. Patient was found to be complete and was ready to push.   Delivery Note At 11:57 AM a viable female was delivered via Vaginal, Spontaneous Delivery (Presentation: ; Occiput Anterior). Had compound presentation with left arm wrapped across body and hand by chest.  APGAR: 9, 9; weight 8 lb 3 oz (3714 g).  Placenta status: Intact, Spontaneous.  Cord: 3 vessels with the following complications: None.    Anesthesia: Epidural  Episiotomy: None Lacerations: 2nd degree;Vaginal Suture Repair: 3.0 vicryl rapide Est. Blood Loss (mL): 167  Mom to postpartum.  Baby to Couplet care / Skin to Skin.  Durenda Hurt 10/13/2014, 2:29 PM

## 2014-02-04 LAB — OB RESULTS CONSOLE HGB/HCT, BLOOD
HEMATOCRIT: 35 %
Hemoglobin: 11.2 g/dL

## 2014-02-04 LAB — OB RESULTS CONSOLE PLATELET COUNT: PLATELETS: 405 10*3/uL

## 2014-03-16 ENCOUNTER — Encounter (HOSPITAL_COMMUNITY): Payer: Self-pay | Admitting: *Deleted

## 2014-03-16 ENCOUNTER — Emergency Department (HOSPITAL_COMMUNITY)
Admission: EM | Admit: 2014-03-16 | Discharge: 2014-03-16 | Disposition: A | Discharge status: Home / Self Care | Payer: Medicaid Other | Attending: Emergency Medicine | Admitting: Emergency Medicine

## 2014-03-16 DIAGNOSIS — O99511 Diseases of the respiratory system complicating pregnancy, first trimester: Secondary | ICD-10-CM | POA: Insufficient documentation

## 2014-03-16 DIAGNOSIS — J45909 Unspecified asthma, uncomplicated: Secondary | ICD-10-CM | POA: Diagnosis not present

## 2014-03-16 DIAGNOSIS — G43109 Migraine with aura, not intractable, without status migrainosus: Secondary | ICD-10-CM | POA: Insufficient documentation

## 2014-03-16 DIAGNOSIS — Z3A10 10 weeks gestation of pregnancy: Secondary | ICD-10-CM | POA: Insufficient documentation

## 2014-03-16 DIAGNOSIS — O219 Vomiting of pregnancy, unspecified: Secondary | ICD-10-CM

## 2014-03-16 DIAGNOSIS — Z8659 Personal history of other mental and behavioral disorders: Secondary | ICD-10-CM | POA: Insufficient documentation

## 2014-03-16 DIAGNOSIS — Z791 Long term (current) use of non-steroidal anti-inflammatories (NSAID): Secondary | ICD-10-CM | POA: Diagnosis not present

## 2014-03-16 DIAGNOSIS — O210 Mild hyperemesis gravidarum: Secondary | ICD-10-CM | POA: Insufficient documentation

## 2014-03-16 DIAGNOSIS — O9989 Other specified diseases and conditions complicating pregnancy, childbirth and the puerperium: Secondary | ICD-10-CM | POA: Insufficient documentation

## 2014-03-16 DIAGNOSIS — R6884 Jaw pain: Secondary | ICD-10-CM | POA: Diagnosis not present

## 2014-03-16 DIAGNOSIS — Z79899 Other long term (current) drug therapy: Secondary | ICD-10-CM | POA: Insufficient documentation

## 2014-03-16 DIAGNOSIS — O99411 Diseases of the circulatory system complicating pregnancy, first trimester: Secondary | ICD-10-CM | POA: Insufficient documentation

## 2014-03-16 MED ORDER — METOCLOPRAMIDE HCL 10 MG PO TABS
10.0000 mg | ORAL_TABLET | Freq: Once | ORAL | Status: AC
  Administered 2014-03-16: 10 mg via ORAL
  Filled 2014-03-16 (×2): qty 1

## 2014-03-16 MED ORDER — HYDROCODONE-ACETAMINOPHEN 5-325 MG PO TABS
1.0000 | ORAL_TABLET | Freq: Once | ORAL | Status: AC
  Administered 2014-03-16: 1 via ORAL
  Filled 2014-03-16: qty 1

## 2014-03-16 MED ORDER — HYDROCODONE-ACETAMINOPHEN 5-325 MG PO TABS: 1.0000 | ORAL_TABLET | ORAL | Status: DC | PRN

## 2014-03-16 MED ORDER — METOCLOPRAMIDE HCL 10 MG PO TABS: 10.0000 mg | ORAL_TABLET | Freq: Four times a day (QID) | ORAL | Status: DC

## 2014-03-16 NOTE — ED Provider Notes (Signed)
CSN: [id number]     Arrival date & time 03/16/14  1733 History   First MD Initiated Contact with Patient 03/16/14 1916     Chief Complaint  Patient presents with  . Jaw Pain  . Morning Sickness     (Consider location/radiation/quality/duration/timing/severity/associated sxs/prior Treatment) HPI  Pt presenting with c/o right jaw pain and nausea.  She is approx [redacted] weeks pregnant, no abdominal pain or vaginal bleeding.  She has been nauseated, not having any vomiting.  She states that she was given a nausea med rx from [place] but didn't bring the medication with her when she moved here.  No fever/chills.  She c/o pain in right side of jaw, states the pain is throbbing in nature, no dental pain.  States the pain radiates into the right side of head and gives her a migraine.  No neck pain. No sore throat.  No nasal congestion, or nasal drainage.   There are no other associated systemic symptoms, there are no other alleviating or modifying factors.   Past Medical History  Diagnosis Date  . Asthma   . Migraine with aura 04/25/2012  . Depression   . Anxiety    Past Surgical History  Procedure Laterality Date  . Wisdom tooth extraction     Family History  Problem Relation Age of Onset  . Heart murmur Mother   . Miscarriages / [place] Mother   . Autism Paternal Uncle   . Down syndrome Paternal Uncle    History  Substance Use Topics  . Smoking status: Never Smoker   . Smokeless tobacco: Not on file  . Alcohol Use: Yes     Comment: social   OB History    Gravida Para Term Preterm AB TAB SAB Ectopic Multiple Living   1 1 1       1      Review of Systems  ROS reviewed and all otherwise negative except for mentioned in HPI    Allergies  Benadryl and Lorazepam  Home Medications   Prior to Admission medications   Medication Sig Start Date End Date Taking? Authorizing Provider  albuterol (PROVENTIL HFA;VENTOLIN HFA) 108 (90 BASE) MCG/ACT inhaler Inhale 2 puffs into  the lungs every 6 (six) hours as needed for wheezing or shortness of breath.    Yes Historical Provider, MD  cyclobenzaprine (FLEXERIL) 10 MG tablet Take 0.5-1 tablets (5-10 mg total) by mouth 3 (three) times daily as needed for muscle spasms. Patient not taking: Reported on 03/16/2014 11/01/13   Arthor CaptainAbigail Harris, PA-C  HYDROcodone-acetaminophen (NORCO/VICODIN) 5-325 MG per tablet Take 1 tablet by mouth every 4 (four) hours as needed. 03/16/14   Ethelda ChickMartha K Linker, MD  metoCLOPramide (REGLAN) 10 MG tablet Take 1 tablet (10 mg total) by mouth every 6 (six) hours. 03/16/14   Ethelda ChickMartha K Linker, MD  naproxen (NAPROSYN) 500 MG tablet Take 1 tablet (500 mg total) by mouth 2 (two) times daily with a meal. Patient not taking: Reported on 03/16/2014 11/01/13   Arthor CaptainAbigail Harris, PA-C   BP 118/79 mmHg  Pulse 70  Temp(Src) 97.9 F (36.6 C) (Oral)  Resp 18  Ht 5\' 5"  (1.651 m)  Wt 148 lb (67.132 kg)  BMI 24.63 kg/m2  SpO2 100%  LMP 01/04/2014  Vitals reviewed Physical Exam  Physical Examination: General appearance - alert, well appearing, and in no distress Mental status - alert, oriented to person, place, and time Eyes - no conjunctival injection, no scleral icterus Face- no maxillary sinus tenderness,  no ttp over temple Mouth - mucous membranes moist, pharynx normal without lesions, broken tooth in right lower jaw, but no tenderness of teeth to palpation, no gingival tenderness or facial swelling to suggest dental abscess Neck - supple, no significant adenopathy Chest - clear to auscultation, no wheezes, rales or rhonchi, symmetric air entry Heart - normal rate, regular rhythm, normal S1, S2, no murmurs, rubs, clicks or gallops Abdomen - soft, nontender, nondistended, no masses or organomegaly Extremities - peripheral pulses normal, no pedal edema, no clubbing or cyanosis Skin - normal coloration and turgor, no rashes  ED Course  Procedures (including critical care time) Labs Review Labs Reviewed - No data  to display  Imaging Review No results found.   EKG Interpretation None      MDM   Final diagnoses:  Jaw pain  Nausea/vomiting in pregnancy    Pt presenting at approx [redacted] weeks pregnant with c/o jaw soreness- doubt acute bacterial sinusitis, no dental abscess present.  May be TMJ pain.  Abdomen is nontender patient is not having any vomiting but has had nausea since being pregnant.  Pt given hydrocodone, reglan and short rx for both.  Given information for OB followup as she states she has moved from [place]. No abdominal pain or vaginal bleeding to warrant concern for ectopic pregnancy.  Discharged with strict return precautions.  Pt agreeable with plan.    Ethelda Chick, MD 03/16/14 2215

## 2014-03-16 NOTE — Discharge Instructions (Signed)
Return to the ED with any concerns including vomiting and not able to keep down liquids, fever/chills, abdominal pain, vaginal bleeding, decreased level of alertness/lethargy, or any other alarming symptoms

## 2014-03-16 NOTE — ED Notes (Signed)
pharmacy called to send Reglan tablet.

## 2014-03-16 NOTE — ED Notes (Signed)
Pt reports nausea from pregnancy [redacted] weeks pregnant. Does not have nausea medicine. Also reports jaw pain, right side, unable to sleep due to pain.

## 2014-03-23 ENCOUNTER — Encounter (HOSPITAL_COMMUNITY): Payer: Self-pay

## 2014-03-23 ENCOUNTER — Inpatient Hospital Stay (HOSPITAL_COMMUNITY)
Admission: AD | Admit: 2014-03-23 | Discharge: 2014-03-23 | Disposition: A | Discharge status: Home / Self Care | Payer: Medicaid Other | Source: Ambulatory Visit | Attending: Obstetrics & Gynecology | Admitting: Obstetrics & Gynecology

## 2014-03-23 DIAGNOSIS — O218 Other vomiting complicating pregnancy: Secondary | ICD-10-CM

## 2014-03-23 DIAGNOSIS — Z3A11 11 weeks gestation of pregnancy: Secondary | ICD-10-CM

## 2014-03-23 DIAGNOSIS — O210 Mild hyperemesis gravidarum: Secondary | ICD-10-CM

## 2014-03-23 LAB — URINALYSIS, ROUTINE W REFLEX MICROSCOPIC
Bilirubin Urine: NEGATIVE
Glucose, UA: NEGATIVE mg/dL
HGB URINE DIPSTICK: NEGATIVE
Ketones, ur: NEGATIVE mg/dL
Leukocytes, UA: NEGATIVE
Nitrite: NEGATIVE
PROTEIN: NEGATIVE mg/dL
SPECIFIC GRAVITY, URINE: 1.02 (ref 1.005–1.030)
Urobilinogen, UA: 1 mg/dL (ref 0.0–1.0)
pH: 7 (ref 5.0–8.0)

## 2014-03-23 MED ORDER — PROMETHAZINE HCL 12.5 MG PO TABS: 12.5000 mg | ORAL_TABLET | Freq: Four times a day (QID) | ORAL | Status: DC | PRN

## 2014-03-23 NOTE — MAU Note (Signed)
Pt states here for nausea throughout day for the past month and a half. Denies abnormal vaginal discharge. No pain.

## 2014-03-23 NOTE — Discharge Instructions (Signed)

## 2014-03-23 NOTE — MAU Provider Note (Signed)
Subjective:  [name] is a 24 y.o. female G2P1001 at 3420w4d who presents with Nausea.  She was prescribed reglan and does not feel like it works. [place] and is scheduled to see [facility] later this month. She is also requesting a dental note so that she can be treated at the Dentist for a "dental emergency."  She is not vomiting in this pregnancy, however does have constant nausea. She has an Advertising account executiveX for diclegis and plans to take it.    Objective:  GENERAL: Well-developed, well-nourished female in no acute distress.  HEENT: Normocephalic, atraumatic.   LUNGS: Effort normal HEART: Regular rate  ABDOMEN: soft, non tender  SKIN: Warm, dry and without erythema PSYCH: Normal mood and affect  Filed Vitals:   03/23/14 1347  BP: 123/75  Pulse: 95  Temp: 98.5 F (36.9 C)  TempSrc: Oral  Resp: 16  Height: 5\' 5"  (1.651 m)  Weight: 67.132 kg (148 lb)    Results for orders placed or performed during the hospital encounter of 03/23/14 (from the past 48 hour(s))  Urinalysis, Routine w reflex microscopic     Status: None   Collection Time: 03/23/14  1:51 PM  Result Value Ref Range   Color, Urine YELLOW YELLOW   APPearance CLEAR CLEAR   Specific Gravity, Urine 1.020 1.005 - 1.030   pH 7.0 5.0 - 8.0   Glucose, UA NEGATIVE NEGATIVE mg/dL   Hgb urine dipstick NEGATIVE NEGATIVE   Bilirubin Urine NEGATIVE NEGATIVE   Ketones, ur NEGATIVE NEGATIVE mg/dL   Protein, ur NEGATIVE NEGATIVE mg/dL   Urobilinogen, UA 1.0 0.0 - 1.0 mg/dL   Nitrite NEGATIVE NEGATIVE   Leukocytes, UA NEGATIVE NEGATIVE    Comment: MICROSCOPIC NOT DONE ON URINES WITH NEGATIVE PROTEIN, BLOOD, LEUKOCYTES, NITRITE, OR GLUCOSE <1000 mg/dL.     Assessment:  1. Morning sickness      Plan:  Discharge home in stable condition Follow up with [facility] RX: [facility] note provided and faxed to desired dentist Return to MAU if symptoms worsen Small, frequent meals.    Duane LopeJennifer I  Reighan Hipolito, NP 03/23/2014 4:52 PM

## 2014-03-23 NOTE — MAU Note (Signed)
Pt states that she has not had any vomiting, reports nausea and was given reglan and it is not working. Also reports she needs a note for her dentist to be able to receive dental work

## 2014-04-13 ENCOUNTER — Encounter: Payer: Self-pay | Admitting: *Deleted

## 2014-04-16 ENCOUNTER — Other Ambulatory Visit (HOSPITAL_COMMUNITY)
Admission: RE | Admit: 2014-04-16 | Discharge: 2014-04-16 | Disposition: A | Discharge status: Home / Self Care | Payer: Medicaid Other | Source: Ambulatory Visit | Attending: Family Medicine | Admitting: Family Medicine

## 2014-04-16 ENCOUNTER — Encounter: Payer: Self-pay | Admitting: Family Medicine

## 2014-04-16 ENCOUNTER — Ambulatory Visit (INDEPENDENT_AMBULATORY_CARE_PROVIDER_SITE_OTHER): Payer: Medicaid Other | Admitting: Family Medicine

## 2014-04-16 VITALS — BP 114/99 | HR 104 | Temp 98.0°F | Wt 147.9 lb

## 2014-04-16 DIAGNOSIS — Z124 Encounter for screening for malignant neoplasm of cervix: Secondary | ICD-10-CM

## 2014-04-16 DIAGNOSIS — Z3482 Encounter for supervision of other normal pregnancy, second trimester: Secondary | ICD-10-CM

## 2014-04-16 DIAGNOSIS — Z23 Encounter for immunization: Secondary | ICD-10-CM | POA: Diagnosis not present

## 2014-04-16 DIAGNOSIS — Z1151 Encounter for screening for human papillomavirus (HPV): Secondary | ICD-10-CM | POA: Insufficient documentation

## 2014-04-16 DIAGNOSIS — Z3490 Encounter for supervision of normal pregnancy, unspecified, unspecified trimester: Secondary | ICD-10-CM | POA: Insufficient documentation

## 2014-04-16 DIAGNOSIS — Z118 Encounter for screening for other infectious and parasitic diseases: Secondary | ICD-10-CM

## 2014-04-16 DIAGNOSIS — Z113 Encounter for screening for infections with a predominantly sexual mode of transmission: Secondary | ICD-10-CM | POA: Diagnosis present

## 2014-04-16 DIAGNOSIS — Z01419 Encounter for gynecological examination (general) (routine) without abnormal findings: Secondary | ICD-10-CM | POA: Insufficient documentation

## 2014-04-16 LAB — POCT URINALYSIS DIP (DEVICE)
GLUCOSE, UA: NEGATIVE mg/dL
Hgb urine dipstick: NEGATIVE
KETONES UR: NEGATIVE mg/dL
Nitrite: NEGATIVE
Protein, ur: NEGATIVE mg/dL
Specific Gravity, Urine: 1.025 (ref 1.005–1.030)
Urobilinogen, UA: 1 mg/dL (ref 0.0–1.0)
pH: 6 (ref 5.0–8.0)

## 2014-04-16 LAB — OB RESULTS CONSOLE GC/CHLAMYDIA
Chlamydia: NEGATIVE
GC PROBE AMP, GENITAL: NEGATIVE

## 2014-04-16 NOTE — Progress Notes (Signed)
Nutrition note: 1st visit consult Pt has lost 2.1# @ 15w. Pt reports eating 3 meals & 5 snacks/d. Pt is taking a PNV.Marland Kitchen. Pt reports nausea occ but no vomiting or heartburn. Pt received verbal & written education on general nutrition during pregnancy. Encouraged protein with all meals & snacks. Discussed wt gain goals of 15-25# or 0.5#/wk. Pt agrees to consume protein with all meals & snacks. Pt does not have WIC but plans to apply. Pt plans to BF. F/u 4-6 wks Blondell RevealLaura Davione Lenker, MS, RD, LDN, [facility] CarolinaBCLC

## 2014-04-16 NOTE — Progress Notes (Signed)
Small bilirubin and leuks present in urine.

## 2014-04-16 NOTE — Patient Instructions (Signed)
Second Trimester of Pregnancy The second trimester is from week 13 through week 28, months 4 through 6. The second trimester is often a time when you feel your best. Your body has also adjusted to being pregnant, and you begin to feel better physically. Usually, morning sickness has lessened or quit completely, you may have more energy, and you may have an increase in appetite. The second trimester is also a time when the fetus is growing rapidly. At the end of the sixth month, the fetus is about 9 inches long and weighs about 1 pounds. You will likely begin to feel the baby move (quickening) between 18 and 20 weeks of the pregnancy. BODY CHANGES Your body goes through many changes during pregnancy. The changes vary from woman to woman.   Your weight will continue to increase. You will notice your lower abdomen bulging out.  You may begin to get stretch marks on your hips, abdomen, and breasts.  You may develop headaches that can be relieved by medicines approved by your health care provider.  You may urinate more often because the fetus is pressing on your bladder.  You may develop or continue to have heartburn as a result of your pregnancy.  You may develop constipation because certain hormones are causing the muscles that push waste through your intestines to slow down.  You may develop hemorrhoids or swollen, bulging veins (varicose veins).  You may have back pain because of the weight gain and pregnancy hormones relaxing your joints between the bones in your pelvis and as a result of a shift in weight and the muscles that support your balance.  Your breasts will continue to grow and be tender.  Your gums may bleed and may be sensitive to brushing and flossing.  Dark spots or blotches (chloasma, mask of pregnancy) may develop on your face. This will likely fade after the baby is born.  A dark line from your belly button to the pubic area (linea nigra) may appear. This will likely  fade after the baby is born.  You may have changes in your hair. These can include thickening of your hair, rapid growth, and changes in texture. Some women also have hair loss during or after pregnancy, or hair that feels dry or thin. Your hair will most likely return to normal after your baby is born. WHAT TO EXPECT AT YOUR PRENATAL VISITS During a routine prenatal visit:  You will be weighed to make sure you and the fetus are growing normally.  Your blood pressure will be taken.  Your abdomen will be measured to track your baby's growth.  The fetal heartbeat will be listened to.  Any test results from the previous visit will be discussed. Your health care provider may ask you:  How you are feeling.  If you are feeling the baby move.  If you have had any abnormal symptoms, such as leaking fluid, bleeding, severe headaches, or abdominal cramping.  If you have any questions. Other tests that may be performed during your second trimester include:  Blood tests that check for:  Low iron levels (anemia).  Gestational diabetes (between 24 and 28 weeks).  Rh antibodies.  Urine tests to check for infections, diabetes, or protein in the urine.  An ultrasound to confirm the proper growth and development of the baby.  An amniocentesis to check for possible genetic problems.  Fetal screens for spina bifida and Down syndrome. HOME CARE INSTRUCTIONS   Avoid all smoking, herbs, alcohol, and unprescribed   drugs. These chemicals affect the formation and growth of the baby.  Follow your health care provider's instructions regarding medicine use. There are medicines that are either safe or unsafe to take during pregnancy.  Exercise only as directed by your health care provider. Experiencing uterine cramps is a good sign to stop exercising.  Continue to eat regular, healthy meals.  Wear a good support bra for breast tenderness.  Do not use hot tubs, steam rooms, or saunas.  Wear  your seat belt at all times when driving.  Avoid raw meat, uncooked cheese, cat litter boxes, and soil used by cats. These carry germs that can cause birth defects in the baby.  Take your prenatal vitamins.  Try taking a stool softener (if your health care provider approves) if you develop constipation. Eat more high-fiber foods, such as fresh vegetables or fruit and whole grains. Drink plenty of fluids to keep your urine clear or pale yellow.  Take warm sitz baths to soothe any pain or discomfort caused by hemorrhoids. Use hemorrhoid cream if your health care provider approves.  If you develop varicose veins, wear support hose. Elevate your feet for 15 minutes, 3-4 times a day. Limit salt in your diet.  Avoid heavy lifting, wear low heel shoes, and practice good posture.  Rest with your legs elevated if you have leg cramps or low back pain.  Visit your dentist if you have not gone yet during your pregnancy. Use a soft toothbrush to brush your teeth and be gentle when you floss.  A sexual relationship may be continued unless your health care provider directs you otherwise.  Continue to go to all your prenatal visits as directed by your health care provider. SEEK MEDICAL CARE IF:   You have dizziness.  You have mild pelvic cramps, pelvic pressure, or nagging pain in the abdominal area.  You have persistent nausea, vomiting, or diarrhea.  You have a bad smelling vaginal discharge.  You have pain with urination. SEEK IMMEDIATE MEDICAL CARE IF:   You have a fever.  You are leaking fluid from your vagina.  You have spotting or bleeding from your vagina.  You have severe abdominal cramping or pain.  You have rapid weight gain or loss.  You have shortness of breath with chest pain.  You notice sudden or extreme swelling of your face, hands, ankles, feet, or legs.  You have not felt your baby move in over an hour.  You have severe headaches that do not go away with  medicine.  You have vision changes. Document Released: 12/27/2000 Document Revised: 01/07/2013 Document Reviewed: 03/05/2012 [employer] Patient Information 2015 [employer]. This information is not intended to replace advice given to you by your health care provider. Make sure you discuss any questions you have with your health care provider.  Breastfeeding Deciding to breastfeed is one of the best choices you can make for you and your baby. A change in hormones during pregnancy causes your breast tissue to grow and increases the number and size of your milk ducts. These hormones also allow proteins, sugars, and fats from your blood supply to make breast milk in your milk-producing glands. Hormones prevent breast milk from being released before your baby is born as well as prompt milk flow after birth. Once breastfeeding has begun, thoughts of your baby, as well as his or her sucking or crying, can stimulate the release of milk from your milk-producing glands.  BENEFITS OF BREASTFEEDING For Your Baby  Your first   milk (colostrum) helps your baby's digestive system function better.   There are antibodies in your milk that help your baby fight off infections.   Your baby has a lower incidence of asthma, allergies, and sudden infant death syndrome.   The nutrients in breast milk are better for your baby than infant formulas and are designed uniquely for your baby's needs.   Breast milk improves your baby's brain development.   Your baby is less likely to develop other conditions, such as childhood obesity, asthma, or type 2 diabetes mellitus.  For You   Breastfeeding helps to create a very special bond between you and your baby.   Breastfeeding is convenient. Breast milk is always available at the correct temperature and costs nothing.   Breastfeeding helps to burn calories and helps you lose the weight gained during pregnancy.   Breastfeeding makes your uterus contract to its  prepregnancy size faster and slows bleeding (lochia) after you give birth.   Breastfeeding helps to lower your risk of developing type 2 diabetes mellitus, osteoporosis, and breast or ovarian cancer later in life. SIGNS THAT YOUR BABY IS HUNGRY Early Signs of Hunger  Increased alertness or activity.  Stretching.  Movement of the head from side to side.  Movement of the head and opening of the mouth when the corner of the mouth or cheek is stroked (rooting).  Increased sucking sounds, smacking lips, cooing, sighing, or squeaking.  Hand-to-mouth movements.  Increased sucking of fingers or hands. Late Signs of Hunger  Fussing.  Intermittent crying. Extreme Signs of Hunger Signs of extreme hunger will require calming and consoling before your baby will be able to breastfeed successfully. Do not wait for the following signs of extreme hunger to occur before you initiate breastfeeding:   Restlessness.  A loud, strong cry.   Screaming. BREASTFEEDING BASICS Breastfeeding Initiation  Find a comfortable place to sit or lie down, with your neck and back well supported.  Place a pillow or rolled up blanket under your baby to bring him or her to the level of your breast (if you are seated). Nursing pillows are specially designed to help support your arms and your baby while you breastfeed.  Make sure that your baby's abdomen is facing your abdomen.   Gently massage your breast. With your fingertips, massage from your chest wall toward your nipple in a circular motion. This encourages milk flow. You may need to continue this action during the feeding if your milk flows slowly.  Support your breast with 4 fingers underneath and your thumb above your nipple. Make sure your fingers are well away from your nipple and your baby's mouth.   Stroke your baby's lips gently with your finger or nipple.   When your baby's mouth is open wide enough, quickly bring your baby to your breast,  placing your entire nipple and as much of the colored area around your nipple (areola) as possible into your baby's mouth.   More areola should be visible above your baby's upper lip than below the lower lip.   Your baby's tongue should be between his or her lower gum and your breast.   Ensure that your baby's mouth is correctly positioned around your nipple (latched). Your baby's lips should create a seal on your breast and be turned out (everted).  It is common for your baby to suck about 2-3 minutes in order to start the flow of breast milk. Latching Teaching your baby how to latch on to your breast   properly is very important. An improper latch can cause nipple pain and decreased milk supply for you and poor weight gain in your baby. Also, if your baby is not latched onto your nipple properly, he or she may swallow some air during feeding. This can make your baby fussy. Burping your baby when you switch breasts during the feeding can help to get rid of the air. However, teaching your baby to latch on properly is still the best way to prevent fussiness from swallowing air while breastfeeding. Signs that your baby has successfully latched on to your nipple:    Silent tugging or silent sucking, without causing you pain.   Swallowing heard between every 3-4 sucks.    Muscle movement above and in front of his or her ears while sucking.  Signs that your baby has not successfully latched on to nipple:   Sucking sounds or smacking sounds from your baby while breastfeeding.  Nipple pain. If you think your baby has not latched on correctly, slip your finger into the corner of your baby's mouth to break the suction and place it between your baby's gums. Attempt breastfeeding initiation again. Signs of Successful Breastfeeding Signs from your baby:   A gradual decrease in the number of sucks or complete cessation of sucking.   Falling asleep.   Relaxation of his or her body.    Retention of a small amount of milk in his or her mouth.   Letting go of your breast by himself or herself. Signs from you:  Breasts that have increased in firmness, weight, and size 1-3 hours after feeding.   Breasts that are softer immediately after breastfeeding.  Increased milk volume, as well as a change in milk consistency and color by the fifth day of breastfeeding.   Nipples that are not sore, cracked, or bleeding. Signs That Your Baby is Getting Enough Milk  Wetting at least 3 diapers in a 24-hour period. The urine should be clear and pale yellow by age 5 days.  At least 3 stools in a 24-hour period by age 5 days. The stool should be soft and yellow.  At least 3 stools in a 24-hour period by age 7 days. The stool should be seedy and yellow.  No loss of weight greater than 10% of birth weight during the first 3 days of age.  Average weight gain of 4-7 ounces (113-198 g) per week after age 4 days.  Consistent daily weight gain by age 5 days, without weight loss after the age of 2 weeks. After a feeding, your baby may spit up a small amount. This is common. BREASTFEEDING FREQUENCY AND DURATION Frequent feeding will help you make more milk and can prevent sore nipples and breast engorgement. Breastfeed when you feel the need to reduce the fullness of your breasts or when your baby shows signs of hunger. This is called "breastfeeding on demand." Avoid introducing a pacifier to your baby while you are working to establish breastfeeding (the first 4-6 weeks after your baby is born). After this time you may choose to use a pacifier. Research has shown that pacifier use during the first year of a baby's life decreases the risk of sudden infant death syndrome (SIDS). Allow your baby to feed on each breast as long as he or she wants. Breastfeed until your baby is finished feeding. When your baby unlatches or falls asleep while feeding from the first breast, offer the second breast.  Because newborns are often sleepy in the   first few weeks of life, you may need to awaken your baby to get him or her to feed. Breastfeeding times will vary from baby to baby. However, the following rules can serve as a guide to help you ensure that your baby is properly fed:  Newborns (babies 4 weeks of age or younger) may breastfeed every 1-3 hours.  Newborns should not go longer than 3 hours during the day or 5 hours during the night without breastfeeding.  You should breastfeed your baby a minimum of 8 times in a 24-hour period until you begin to introduce solid foods to your baby at around 6 months of age. BREAST MILK PUMPING Pumping and storing breast milk allows you to ensure that your baby is exclusively fed your breast milk, even at times when you are unable to breastfeed. This is especially important if you are going back to work while you are still breastfeeding or when you are not able to be present during feedings. Your lactation consultant can give you guidelines on how long it is safe to store breast milk.  A breast pump is a machine that allows you to pump milk from your breast into a sterile bottle. The pumped breast milk can then be stored in a refrigerator or freezer. Some breast pumps are operated by hand, while others use electricity. Ask your lactation consultant which type will work best for you. Breast pumps can be purchased, but some hospitals and breastfeeding support groups lease breast pumps on a monthly basis. A lactation consultant can teach you how to hand express breast milk, if you prefer not to use a pump.  CARING FOR YOUR BREASTS WHILE YOU BREASTFEED Nipples can become dry, cracked, and sore while breastfeeding. The following recommendations can help keep your breasts moisturized and healthy:  Avoid using soap on your nipples.   Wear a supportive bra. Although not required, special nursing bras and tank tops are designed to allow access to your breasts for  breastfeeding without taking off your entire bra or top. Avoid wearing underwire-style bras or extremely tight bras.  Air dry your nipples for 3-4minutes after each feeding.   Use only cotton bra pads to absorb leaked breast milk. Leaking of breast milk between feedings is normal.   Use lanolin on your nipples after breastfeeding. Lanolin helps to maintain your skin's normal moisture barrier. If you use pure lanolin, you do not need to wash it off before feeding your baby again. Pure lanolin is not toxic to your baby. You may also hand express a few drops of breast milk and gently massage that milk into your nipples and allow the milk to air dry. In the first few weeks after giving birth, some women experience extremely full breasts (engorgement). Engorgement can make your breasts feel heavy, warm, and tender to the touch. Engorgement peaks within 3-5 days after you give birth. The following recommendations can help ease engorgement:  Completely empty your breasts while breastfeeding or pumping. You may want to start by applying warm, moist heat (in the shower or with warm water-soaked hand towels) just before feeding or pumping. This increases circulation and helps the milk flow. If your baby does not completely empty your breasts while breastfeeding, pump any extra milk after he or she is finished.  Wear a snug bra (nursing or regular) or tank top for 1-2 days to signal your body to slightly decrease milk production.  Apply ice packs to your breasts, unless this is too uncomfortable for you.    Make sure that your baby is latched on and positioned properly while breastfeeding. If engorgement persists after 48 hours of following these recommendations, contact your health care provider or a lactation consultant. OVERALL HEALTH CARE RECOMMENDATIONS WHILE BREASTFEEDING  Eat healthy foods. Alternate between meals and snacks, eating 3 of each per day. Because what you eat affects your breast milk,  some of the foods may make your baby more irritable than usual. Avoid eating these foods if you are sure that they are negatively affecting your baby.  Drink milk, fruit juice, and water to satisfy your thirst (about 10 glasses a day).   Rest often, relax, and continue to take your prenatal vitamins to prevent fatigue, stress, and anemia.  Continue breast self-awareness checks.  Avoid chewing and smoking tobacco.  Avoid alcohol and drug use. Some medicines that may be harmful to your baby can pass through breast milk. It is important to ask your health care provider before taking any medicine, including all over-the-counter and prescription medicine as well as vitamin and herbal supplements. It is possible to become pregnant while breastfeeding. If birth control is desired, ask your health care provider about options that will be safe for your baby. SEEK MEDICAL CARE IF:   You feel like you want to stop breastfeeding or have become frustrated with breastfeeding.  You have painful breasts or nipples.  Your nipples are cracked or bleeding.  Your breasts are red, tender, or warm.  You have a swollen area on either breast.  You have a fever or chills.  You have nausea or vomiting.  You have drainage other than breast milk from your nipples.  Your breasts do not become full before feedings by the fifth day after you give birth.  You feel sad and depressed.  Your baby is too sleepy to eat well.  Your baby is having trouble sleeping.   Your baby is wetting less than 3 diapers in a 24-hour period.  Your baby has less than 3 stools in a 24-hour period.  Your baby's skin or the white part of his or her eyes becomes yellow.   Your baby is not gaining weight by 5 days of age. SEEK IMMEDIATE MEDICAL CARE IF:   Your baby is overly tired (lethargic) and does not want to wake up and feed.  Your baby develops an unexplained fever. Document Released: 01/02/2005 Document Revised:  01/07/2013 Document Reviewed: 06/26/2012 [employer] Patient Information 2015 [employer]. This information is not intended to replace advice given to you by your health care provider. Make sure you discuss any questions you have with your health care provider.  

## 2014-04-16 NOTE — Progress Notes (Signed)
   Subjective:    Monica Munoz is a G2P1001 [id number] being seen today for her first obstetrical visit.  Her obstetrical history is significant for short interval between pregnancies. Pregnancy history fully reviewed.  Patient reports no complaints.  Filed Vitals:   04/16/14 0845  BP: 114/99  Pulse: 104  Temp: 98 F (36.7 C)  Weight: 147 lb 14.4 oz (67.087 kg)    HISTORY: OB History  Gravida Para Term Preterm AB SAB TAB Ectopic Multiple Living  2 1 1  0 0 0 0 0 0 1    # Outcome Date GA Lbr Len/2nd Weight Sex Delivery Anes PTL Lv  2 Current           1 Term 05/27/13 120w0d 02:30 / 02:19 8 lb 10.3 oz (3.92 kg) M Vag-Spont EPI  Y     Past Medical History  Diagnosis Date  . Asthma   . Migraine with aura 04/25/2012  . Depression   . Anxiety    Past Surgical History  Procedure Laterality Date  . Wisdom tooth extraction     Family History  Problem Relation Age of Onset  . Heart murmur Mother   . Miscarriages / [place] Mother   . Diabetes Mother   . Autism Paternal Uncle   . Down syndrome Paternal Uncle   . Stroke Maternal Grandmother   . Diabetes Maternal Grandfather   . Hyperlipidemia Maternal Grandfather   . Stroke Maternal Grandfather      Exam    Uterus:   16 wk size  Pelvic Exam:    Perineum: Normal Perineum   Vulva: Bartholin's, Urethra, Skene's normal   Vagina:  normal mucosa, normal discharge   Cervix: no cervical motion tenderness and no lesions   Adnexa: no mass, fullness, tenderness   Bony Pelvis: average  System: Breast:  normal appearance, no masses or tenderness, piercings bilaterally   Skin: normal coloration and turgor, no rashes    Neurologic: normal   Extremities: normal strength, tone, and muscle mass   HEENT sclera clear, anicteric   Mouth/Teeth mucous membranes moist, pharynx normal without lesions and dental hygiene good   Neck supple   Cardiovascular: regular rate and rhythm, no murmurs or gallops   Respiratory:  appears well, vitals  normal, no respiratory distress, acyanotic, normal RR, ear and throat exam is normal, neck free of mass or lymphadenopathy, chest clear, no wheezing, crepitations, rhonchi, normal symmetric air entry   Abdomen: soft, non-tender; bowel sounds normal; no masses,  no organomegaly      Assessment:    Pregnancy: G2P1001 Patient Active Problem List   Diagnosis Date Noted  . Supervision of normal pregnancy, antepartum 04/16/2014  . Anxiety state, unspecified 05/26/2013  . Asthma, mild intermittent 05/26/2013  . Depression 05/26/2013  . 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 Headache, migraine 01/29/2012        Plan:     Initial labs drawn. Prenatal vitamins. Problem list reviewed and updated. Genetic Screening discussed Quad Screen: ordered.  Ultrasound discussed; fetal survey: ordered.  Follow up in 4 weeks.    Derisha Funderburke S 04/16/2014

## 2014-04-16 NOTE — Progress Notes (Signed)
Anatomy U/S 05/14/14 @ 830a with Radiology.

## 2014-04-17 LAB — PRENATAL PROFILE (SOLSTAS)
ANTIBODY SCREEN: NEGATIVE
BASOS ABS: 0 10*3/uL (ref 0.0–0.1)
Basophils Relative: 0 % (ref 0–1)
EOS PCT: 5 % (ref 0–5)
Eosinophils Absolute: 0.7 10*3/uL (ref 0.0–0.7)
HEMATOCRIT: 35.6 % — AB (ref 36.0–46.0)
HEMOGLOBIN: 12.1 g/dL (ref 12.0–15.0)
HIV 1&2 Ab, 4th Generation: NONREACTIVE
Hepatitis B Surface Ag: NEGATIVE
LYMPHS ABS: 3.1 10*3/uL (ref 0.7–4.0)
Lymphocytes Relative: 21 % (ref 12–46)
MCH: 29.9 pg (ref 26.0–34.0)
MCHC: 34 g/dL (ref 30.0–36.0)
MCV: 87.9 fL (ref 78.0–100.0)
MPV: 10.1 fL (ref 8.6–12.4)
Monocytes Absolute: 0.9 10*3/uL (ref 0.1–1.0)
Monocytes Relative: 6 % (ref 3–12)
Neutro Abs: 10.1 10*3/uL — ABNORMAL HIGH (ref 1.7–7.7)
Neutrophils Relative %: 68 % (ref 43–77)
Platelets: 283 10*3/uL (ref 150–400)
RBC: 4.05 MIL/uL (ref 3.87–5.11)
RDW: 16.9 % — AB (ref 11.5–15.5)
RH TYPE: POSITIVE
Rubella: 3.77 Index — ABNORMAL HIGH (ref <0.90)
WBC: 14.8 10*3/uL — ABNORMAL HIGH (ref 4.0–10.5)

## 2014-04-17 LAB — CYTOLOGY - PAP

## 2014-04-17 LAB — CULTURE, OB URINE: Colony Count: 85000

## 2014-04-17 LAB — GLUCOSE TOLERANCE, 1 HOUR (50G) W/O FASTING: GLUCOSE 1 HOUR GTT: 65 mg/dL — AB (ref 70–140)

## 2014-04-18 LAB — PRESCRIPTION MONITORING PROFILE (19 PANEL)
Amphetamine/Meth: NEGATIVE ng/mL
Barbiturate Screen, Urine: NEGATIVE ng/mL
Benzodiazepine Screen, Urine: NEGATIVE ng/mL
Buprenorphine, Urine: NEGATIVE ng/mL
CARISOPRODOL, URINE: NEGATIVE ng/mL
COCAINE METABOLITES: NEGATIVE ng/mL
Cannabinoid Scrn, Ur: NEGATIVE ng/mL
Creatinine, Urine: 286.29 mg/dL (ref >20.0)
FENTANYL URINE: NEGATIVE ng/mL
MDMA URINE: NEGATIVE ng/mL
MEPERIDINE UR: NEGATIVE ng/mL
Methadone Screen, Urine: NEGATIVE ng/mL
Methaqualone: NEGATIVE ng/mL
Nitrites, Initial: NEGATIVE ug/mL
Opiate Screen, Urine: NEGATIVE ng/mL
Oxycodone Screen, Ur: NEGATIVE ng/mL
PHENCYCLIDINE, UR: NEGATIVE ng/mL
PROPOXYPHENE: NEGATIVE ng/mL
Tapentadol, urine: NEGATIVE ng/mL
Tramadol Scrn, Ur: NEGATIVE ng/mL
Zolpidem, Urine: NEGATIVE ng/mL
pH, Initial: 6.5 pH (ref 4.5–8.9)

## 2014-04-21 LAB — AFP, QUAD SCREEN
AFP: 26.3 ng/mL
CURR GEST AGE: 15 wks.days
HCG TOTAL: 48.53 [IU]/mL
INH: 169.4 pg/mL
Interpretation-AFP: NEGATIVE
MOM FOR INH: 0.9
MoM for AFP: 0.89
MoM for hCG: 0.95
Open Spina bifida: NEGATIVE
Osb Risk: 1:20000 {titer}
Tri 18 Scr Risk Est: NEGATIVE
uE3 Mom: 1.3
uE3 Value: 0.78 ng/mL

## 2014-05-14 ENCOUNTER — Other Ambulatory Visit: Payer: Self-pay | Admitting: Family Medicine

## 2014-05-14 ENCOUNTER — Encounter: Payer: Self-pay | Admitting: Family Medicine

## 2014-05-14 ENCOUNTER — Telehealth: Payer: Self-pay | Admitting: Family Medicine

## 2014-05-14 ENCOUNTER — Encounter: Payer: Medicaid Other | Admitting: Family Medicine

## 2014-05-14 ENCOUNTER — Ambulatory Visit (HOSPITAL_COMMUNITY)
Admission: RE | Admit: 2014-05-14 | Discharge: 2014-05-14 | Disposition: A | Discharge status: Home / Self Care | Payer: Medicaid Other | Source: Ambulatory Visit | Attending: Family Medicine | Admitting: Family Medicine

## 2014-05-14 DIAGNOSIS — Z36 Encounter for antenatal screening of mother: Secondary | ICD-10-CM | POA: Insufficient documentation

## 2014-05-14 DIAGNOSIS — Z3A18 18 weeks gestation of pregnancy: Secondary | ICD-10-CM | POA: Insufficient documentation

## 2014-05-14 DIAGNOSIS — Z3A19 19 weeks gestation of pregnancy: Secondary | ICD-10-CM | POA: Insufficient documentation

## 2014-05-14 DIAGNOSIS — Z3482 Encounter for supervision of other normal pregnancy, second trimester: Secondary | ICD-10-CM

## 2014-05-14 DIAGNOSIS — Z3689 Encounter for other specified antenatal screening: Secondary | ICD-10-CM | POA: Insufficient documentation

## 2014-05-14 NOTE — Telephone Encounter (Signed)
Mailing letter to patient regarding missed follow up appointment.

## 2014-06-01 ENCOUNTER — Encounter: Payer: Self-pay | Admitting: Obstetrics & Gynecology

## 2014-06-09 ENCOUNTER — Ambulatory Visit (INDEPENDENT_AMBULATORY_CARE_PROVIDER_SITE_OTHER): Payer: Self-pay | Admitting: Obstetrics & Gynecology

## 2014-06-09 VITALS — BP 114/67 | HR 86 | Temp 98.0°F | Wt 157.7 lb

## 2014-06-09 DIAGNOSIS — Z3482 Encounter for supervision of other normal pregnancy, second trimester: Secondary | ICD-10-CM

## 2014-06-09 LAB — POCT URINALYSIS DIP (DEVICE)
Bilirubin Urine: NEGATIVE
Glucose, UA: NEGATIVE mg/dL
HGB URINE DIPSTICK: NEGATIVE
NITRITE: NEGATIVE
PH: 6.5 (ref 5.0–8.0)
PROTEIN: NEGATIVE mg/dL
Specific Gravity, Urine: 1.025 (ref 1.005–1.030)
Urobilinogen, UA: 1 mg/dL (ref 0.0–1.0)

## 2014-06-09 NOTE — Patient Instructions (Signed)
Second Trimester of Pregnancy The second trimester is from week 13 through week 28, months 4 through 6. The second trimester is often a time when you feel your best. Your body has also adjusted to being pregnant, and you begin to feel better physically. Usually, morning sickness has lessened or quit completely, you may have more energy, and you may have an increase in appetite. The second trimester is also a time when the fetus is growing rapidly. At the end of the sixth month, the fetus is about 9 inches long and weighs about 1 pounds. You will likely begin to feel the baby move (quickening) between 18 and 20 weeks of the pregnancy. BODY CHANGES Your body goes through many changes during pregnancy. The changes vary from woman to woman.   Your weight will continue to increase. You will notice your lower abdomen bulging out.  You may begin to get stretch marks on your hips, abdomen, and breasts.  You may develop headaches that can be relieved by medicines approved by your health care provider.  You may urinate more often because the fetus is pressing on your bladder.  You may develop or continue to have heartburn as a result of your pregnancy.  You may develop constipation because certain hormones are causing the muscles that push waste through your intestines to slow down.  You may develop hemorrhoids or swollen, bulging veins (varicose veins).  You may have back pain because of the weight gain and pregnancy hormones relaxing your joints between the bones in your pelvis and as a result of a shift in weight and the muscles that support your balance.  Your breasts will continue to grow and be tender.  Your gums may bleed and may be sensitive to brushing and flossing.  Dark spots or blotches (chloasma, mask of pregnancy) may develop on your face. This will likely fade after the baby is born.  A dark line from your belly button to the pubic area (linea nigra) may appear. This will likely fade  after the baby is born.  You may have changes in your hair. These can include thickening of your hair, rapid growth, and changes in texture. Some women also have hair loss during or after pregnancy, or hair that feels dry or thin. Your hair will most likely return to normal after your baby is born. WHAT TO EXPECT AT YOUR PRENATAL VISITS During a routine prenatal visit:  You will be weighed to make sure you and the fetus are growing normally.  Your blood pressure will be taken.  Your abdomen will be measured to track your baby's growth.  The fetal heartbeat will be listened to.  Any test results from the previous visit will be discussed. Your health care provider may ask you:  How you are feeling.  If you are feeling the baby move.  If you have had any abnormal symptoms, such as leaking fluid, bleeding, severe headaches, or abdominal cramping.  If you have any questions. Other tests that may be performed during your second trimester include:  Blood tests that check for:  Low iron levels (anemia).  Gestational diabetes (between 24 and 28 weeks).  Rh antibodies.  Urine tests to check for infections, diabetes, or protein in the urine.  An ultrasound to confirm the proper growth and development of the baby.  An amniocentesis to check for possible genetic problems.  Fetal screens for spina bifida and Down syndrome. HOME CARE INSTRUCTIONS   Avoid all smoking, herbs, alcohol, and unprescribed   drugs. These chemicals affect the formation and growth of the baby.  Follow your health care provider's instructions regarding medicine use. There are medicines that are either safe or unsafe to take during pregnancy.  Exercise only as directed by your health care provider. Experiencing uterine cramps is a good sign to stop exercising.  Continue to eat regular, healthy meals.  Wear a good support bra for breast tenderness.  Do not use hot tubs, steam rooms, or saunas.  Wear your  seat belt at all times when driving.  Avoid raw meat, uncooked cheese, cat litter boxes, and soil used by cats. These carry germs that can cause birth defects in the baby.  Take your prenatal vitamins.  Try taking a stool softener (if your health care provider approves) if you develop constipation. Eat more high-fiber foods, such as fresh vegetables or fruit and whole grains. Drink plenty of fluids to keep your urine clear or pale yellow.  Take warm sitz baths to soothe any pain or discomfort caused by hemorrhoids. Use hemorrhoid cream if your health care provider approves.  If you develop varicose veins, wear support hose. Elevate your feet for 15 minutes, 3-4 times a day. Limit salt in your diet.  Avoid heavy lifting, wear low heel shoes, and practice good posture.  Rest with your legs elevated if you have leg cramps or low back pain.  Visit your dentist if you have not gone yet during your pregnancy. Use a soft toothbrush to brush your teeth and be gentle when you floss.  A sexual relationship may be continued unless your health care provider directs you otherwise.  Continue to go to all your prenatal visits as directed by your health care provider. SEEK MEDICAL CARE IF:   You have dizziness.  You have mild pelvic cramps, pelvic pressure, or nagging pain in the abdominal area.  You have persistent nausea, vomiting, or diarrhea.  You have a bad smelling vaginal discharge.  You have pain with urination. SEEK IMMEDIATE MEDICAL CARE IF:   You have a fever.  You are leaking fluid from your vagina.  You have spotting or bleeding from your vagina.  You have severe abdominal cramping or pain.  You have rapid weight gain or loss.  You have shortness of breath with chest pain.  You notice sudden or extreme swelling of your face, hands, ankles, feet, or legs.  You have not felt your baby move in over an hour.  You have severe headaches that do not go away with  medicine.  You have vision changes. Document Released: 12/27/2000 Document Revised: 01/07/2013 Document Reviewed: 03/05/2012 [employer] Patient Information 2015 [employer]. This information is not intended to replace advice given to you by your health care provider. Make sure you discuss any questions you have with your health care provider.  

## 2014-06-09 NOTE — Progress Notes (Signed)
C/o cramping " a lot" - states has them everyday, mainly at night.

## 2014-06-09 NOTE — Progress Notes (Signed)
Pt with no complaints.  Reviewed sono and Quad screen +FM, no ctx, no LOF, no VB F/u in 4 weeks

## 2014-07-07 ENCOUNTER — Ambulatory Visit (INDEPENDENT_AMBULATORY_CARE_PROVIDER_SITE_OTHER): Payer: Self-pay | Admitting: Obstetrics & Gynecology

## 2014-07-07 VITALS — BP 118/57 | HR 80 | Temp 98.1°F | Wt 155.9 lb

## 2014-07-07 DIAGNOSIS — Z23 Encounter for immunization: Secondary | ICD-10-CM

## 2014-07-07 DIAGNOSIS — Z3482 Encounter for supervision of other normal pregnancy, second trimester: Secondary | ICD-10-CM

## 2014-07-07 LAB — CBC
HCT: 32.9 % — ABNORMAL LOW (ref 36.0–46.0)
Hemoglobin: 11.2 g/dL — ABNORMAL LOW (ref 12.0–15.0)
MCH: 31.7 pg (ref 26.0–34.0)
MCHC: 34 g/dL (ref 30.0–36.0)
MCV: 93.2 fL (ref 78.0–100.0)
MPV: 9.6 fL (ref 8.6–12.4)
PLATELETS: 280 10*3/uL (ref 150–400)
RBC: 3.53 MIL/uL — AB (ref 3.87–5.11)
RDW: 13.2 % (ref 11.5–15.5)
WBC: 11.4 10*3/uL — ABNORMAL HIGH (ref 4.0–10.5)

## 2014-07-07 LAB — POCT URINALYSIS DIP (DEVICE)
Glucose, UA: NEGATIVE mg/dL
HGB URINE DIPSTICK: NEGATIVE
Leukocytes, UA: NEGATIVE
Nitrite: NEGATIVE
PROTEIN: 30 mg/dL — AB
SPECIFIC GRAVITY, URINE: 1.025 (ref 1.005–1.030)
UROBILINOGEN UA: 2 mg/dL — AB (ref 0.0–1.0)
pH: 7 (ref 5.0–8.0)

## 2014-07-07 MED ORDER — TETANUS-DIPHTH-ACELL PERTUSSIS 5-2.5-18.5 LF-MCG/0.5 IM SUSP
0.5000 mL | Freq: Once | INTRAMUSCULAR | Status: AC
  Administered 2014-07-07: 0.5 mL via INTRAMUSCULAR

## 2014-07-07 NOTE — Patient Instructions (Signed)
Second Trimester of Pregnancy The second trimester is from week 13 through week 28, months 4 through 6. The second trimester is often a time when you feel your best. Your body has also adjusted to being pregnant, and you begin to feel better physically. Usually, morning sickness has lessened or quit completely, you may have more energy, and you may have an increase in appetite. The second trimester is also a time when the fetus is growing rapidly. At the end of the sixth month, the fetus is about 9 inches long and weighs about 1 pounds. You will likely begin to feel the baby move (quickening) between 18 and 20 weeks of the pregnancy. BODY CHANGES Your body goes through many changes during pregnancy. The changes vary from woman to woman.   Your weight will continue to increase. You will notice your lower abdomen bulging out.  You may begin to get stretch marks on your hips, abdomen, and breasts.  You may develop headaches that can be relieved by medicines approved by your health care provider.  You may urinate more often because the fetus is pressing on your bladder.  You may develop or continue to have heartburn as a result of your pregnancy.  You may develop constipation because certain hormones are causing the muscles that push waste through your intestines to slow down.  You may develop hemorrhoids or swollen, bulging veins (varicose veins).  You may have back pain because of the weight gain and pregnancy hormones relaxing your joints between the bones in your pelvis and as a result of a shift in weight and the muscles that support your balance.  Your breasts will continue to grow and be tender.  Your gums may bleed and may be sensitive to brushing and flossing.  Dark spots or blotches (chloasma, mask of pregnancy) may develop on your face. This will likely fade after the baby is born.  A dark line from your belly button to the pubic area (linea nigra) may appear. This will likely fade  after the baby is born.  You may have changes in your hair. These can include thickening of your hair, rapid growth, and changes in texture. Some women also have hair loss during or after pregnancy, or hair that feels dry or thin. Your hair will most likely return to normal after your baby is born. WHAT TO EXPECT AT YOUR PRENATAL VISITS During a routine prenatal visit:  You will be weighed to make sure you and the fetus are growing normally.  Your blood pressure will be taken.  Your abdomen will be measured to track your baby's growth.  The fetal heartbeat will be listened to.  Any test results from the previous visit will be discussed. Your health care provider may ask you:  How you are feeling.  If you are feeling the baby move.  If you have had any abnormal symptoms, such as leaking fluid, bleeding, severe headaches, or abdominal cramping.  If you have any questions. Other tests that may be performed during your second trimester include:  Blood tests that check for:  Low iron levels (anemia).  Gestational diabetes (between 24 and 28 weeks).  Rh antibodies.  Urine tests to check for infections, diabetes, or protein in the urine.  An ultrasound to confirm the proper growth and development of the baby.  An amniocentesis to check for possible genetic problems.  Fetal screens for spina bifida and Down syndrome. HOME CARE INSTRUCTIONS   Avoid all smoking, herbs, alcohol, and unprescribed   drugs. These chemicals affect the formation and growth of the baby.  Follow your health care provider's instructions regarding medicine use. There are medicines that are either safe or unsafe to take during pregnancy.  Exercise only as directed by your health care provider. Experiencing uterine cramps is a good sign to stop exercising.  Continue to eat regular, healthy meals.  Wear a good support bra for breast tenderness.  Do not use hot tubs, steam rooms, or saunas.  Wear your  seat belt at all times when driving.  Avoid raw meat, uncooked cheese, cat litter boxes, and soil used by cats. These carry germs that can cause birth defects in the baby.  Take your prenatal vitamins.  Try taking a stool softener (if your health care provider approves) if you develop constipation. Eat more high-fiber foods, such as fresh vegetables or fruit and whole grains. Drink plenty of fluids to keep your urine clear or pale yellow.  Take warm sitz baths to soothe any pain or discomfort caused by hemorrhoids. Use hemorrhoid cream if your health care provider approves.  If you develop varicose veins, wear support hose. Elevate your feet for 15 minutes, 3-4 times a day. Limit salt in your diet.  Avoid heavy lifting, wear low heel shoes, and practice good posture.  Rest with your legs elevated if you have leg cramps or low back pain.  Visit your dentist if you have not gone yet during your pregnancy. Use a soft toothbrush to brush your teeth and be gentle when you floss.  A sexual relationship may be continued unless your health care provider directs you otherwise.  Continue to go to all your prenatal visits as directed by your health care provider. SEEK MEDICAL CARE IF:   You have dizziness.  You have mild pelvic cramps, pelvic pressure, or nagging pain in the abdominal area.  You have persistent nausea, vomiting, or diarrhea.  You have a bad smelling vaginal discharge.  You have pain with urination. SEEK IMMEDIATE MEDICAL CARE IF:   You have a fever.  You are leaking fluid from your vagina.  You have spotting or bleeding from your vagina.  You have severe abdominal cramping or pain.  You have rapid weight gain or loss.  You have shortness of breath with chest pain.  You notice sudden or extreme swelling of your face, hands, ankles, feet, or legs.  You have not felt your baby move in over an hour.  You have severe headaches that do not go away with  medicine.  You have vision changes. Document Released: 12/27/2000 Document Revised: 01/07/2013 Document Reviewed: 03/05/2012 [employer] Patient Information 2015 [employer]. This information is not intended to replace advice given to you by your health care provider. Make sure you discuss any questions you have with your health care provider.  

## 2014-07-07 NOTE — Progress Notes (Signed)
Subjective:  Monica Munoz is a 24 y.o. G2P1001 at [redacted]w[redacted]d being seen today for ongoing prenatal care.  Patient reports no complaints.  Contractions: Not present.  Vag. Bleeding: None. Movement: Present. Denies leaking of fluid.   The following portions of the patient's history were reviewed and updated as appropriate: allergies, current medications, past family history, past medical history, past social history, past surgical history and problem list.   Objective:   Filed Vitals:   07/07/14 1541  BP: 118/57  Pulse: 80  Temp: 98.1 F (36.7 C)  Weight: 155 lb 14.4 oz (70.716 kg)    Fetal Status: Fetal Heart Rate (bpm): 145 Fundal Height: 28 cm Movement: Present     General:  Alert, oriented and cooperative. Patient is in no acute distress.  Skin: Skin is warm and dry. No rash noted.   Cardiovascular: Normal heart rate noted  Respiratory: Effort and breath sounds normal, no problems with respiration noted  Abdomen: Soft, gravid, appropriate for gestational age. Pain/Pressure: Present     Vaginal: Vag. Bleeding: None.       Cervix: Not evaluated        Extremities: Normal range of motion.  Edema: None  Mental Status: Normal mood and affect. Normal behavior. Normal judgment and thought content.   Urinalysis: Urine Protein: 1+ Urine Glucose: Negative  Assessment and Plan:  Pregnancy: G2P1001 at [redacted]w[redacted]d  1. Encounter for supervision of other normal pregnancy in second trimester 28 week labs done today - Glucose Tolerance, 1 HR (50g) - RPR - CBC - HIV antibody (with reflex)   Preterm labor symptoms and general obstetric precautions including but not limited to vaginal bleeding, contractions, leaking of fluid and fetal movement were reviewed in detail with the patient.  Please refer to After Visit Summary for other counseling recommendations.   Return in about 2 weeks (around 07/21/2014).   Willodean Rosenthal, MD

## 2014-07-08 LAB — RPR

## 2014-07-08 LAB — GLUCOSE TOLERANCE, 1 HOUR (50G) W/O FASTING: GLUCOSE 1 HOUR GTT: 132 mg/dL (ref 70–140)

## 2014-07-08 LAB — HIV ANTIBODY (ROUTINE TESTING W REFLEX): HIV: NONREACTIVE

## 2014-07-09 ENCOUNTER — Encounter: Payer: Self-pay | Admitting: Obstetrics & Gynecology

## 2014-07-09 ENCOUNTER — Other Ambulatory Visit: Payer: Self-pay | Admitting: Obstetrics & Gynecology

## 2014-07-09 ENCOUNTER — Telehealth: Payer: Self-pay

## 2014-07-09 DIAGNOSIS — O99013 Anemia complicating pregnancy, third trimester: Secondary | ICD-10-CM | POA: Insufficient documentation

## 2014-07-09 NOTE — Telephone Encounter (Deleted)
-----   Message from Willodean Rosenthal, MD sent at 07/09/2014 12:05 PM EDT ----- Please call pt. She is anemic.  She should take Ferrous sulfate bid as well as her PNV.  Thx, clh-S

## 2014-07-09 NOTE — Telephone Encounter (Signed)
Per Dr. Erin Fulling, patient is anemic and should begin taking ferrous sulfate BID with PNV. Attempted to contact patient. No answer. Left message stating we are calling with results, please call clinic.

## 2014-07-10 ENCOUNTER — Telehealth: Payer: Self-pay | Admitting: *Deleted

## 2014-07-10 NOTE — Telephone Encounter (Signed)
Entered in error

## 2014-07-14 NOTE — Telephone Encounter (Signed)
Called patient and informed her of results and need to start iron tablet BID. Patient verbalized understanding. No questions or concerns.

## 2014-07-22 ENCOUNTER — Ambulatory Visit (INDEPENDENT_AMBULATORY_CARE_PROVIDER_SITE_OTHER): Payer: Self-pay | Admitting: Advanced Practice Midwife

## 2014-07-22 VITALS — BP 112/70 | HR 94 | Temp 97.6°F | Wt 161.1 lb

## 2014-07-22 DIAGNOSIS — Z3483 Encounter for supervision of other normal pregnancy, third trimester: Secondary | ICD-10-CM

## 2014-07-22 LAB — POCT URINALYSIS DIP (DEVICE)
BILIRUBIN URINE: NEGATIVE
Glucose, UA: NEGATIVE mg/dL
HGB URINE DIPSTICK: NEGATIVE
NITRITE: NEGATIVE
Protein, ur: 30 mg/dL — AB
SPECIFIC GRAVITY, URINE: 1.02 (ref 1.005–1.030)
Urobilinogen, UA: 1 mg/dL (ref 0.0–1.0)
pH: 7 (ref 5.0–8.0)

## 2014-07-22 NOTE — Patient Instructions (Signed)
Etonogestrel implant What is this medicine? ETONOGESTREL (et oh noe JES trel) is a contraceptive (birth control) device. It is used to prevent pregnancy. It can be used for up to 3 years. This medicine may be used for other purposes; ask your health care provider or pharmacist if you have questions. COMMON BRAND NAME(S): Implanon, Nexplanon What should I tell my health care provider before I take this medicine? They need to know if you have any of these conditions: -abnormal vaginal bleeding -blood vessel disease or blood clots -cancer of the breast, cervix, or liver -depression -diabetes -gallbladder disease -headaches -heart disease or recent heart attack -high blood pressure -high cholesterol -kidney disease -liver disease -renal disease -seizures -tobacco smoker -an unusual or allergic reaction to etonogestrel, other hormones, anesthetics or antiseptics, medicines, foods, dyes, or preservatives -pregnant or trying to get pregnant -breast-feeding How should I use this medicine? This device is inserted just under the skin on the inner side of your upper arm by a health care professional. Talk to your pediatrician regarding the use of this medicine in children. Special care may be needed. Overdosage: If you think you've taken too much of this medicine contact a poison control center or emergency room at once. Overdosage: If you think you have taken too much of this medicine contact a poison control center or emergency room at once. NOTE: This medicine is only for you. Do not share this medicine with others. What if I miss a dose? This does not apply. What may interact with this medicine? Do not take this medicine with any of the following medications: -amprenavir -bosentan -fosamprenavir This medicine may also interact with the following medications: -barbiturate medicines for inducing sleep or treating seizures -certain medicines for fungal infections like ketoconazole and  itraconazole -griseofulvin -medicines to treat seizures like carbamazepine, felbamate, oxcarbazepine, phenytoin, topiramate -modafinil -phenylbutazone -rifampin -some medicines to treat HIV infection like atazanavir, indinavir, lopinavir, nelfinavir, tipranavir, ritonavir -St. John's wort This list may not describe all possible interactions. Give your health care provider a list of all the medicines, herbs, non-prescription drugs, or dietary supplements you use. Also tell them if you smoke, drink alcohol, or use illegal drugs. Some items may interact with your medicine. What should I watch for while using this medicine? This product does not protect you against HIV infection (AIDS) or other sexually transmitted diseases. You should be able to feel the implant by pressing your fingertips over the skin where it was inserted. Tell your doctor if you cannot feel the implant. What side effects may I notice from receiving this medicine? Side effects that you should report to your doctor or health care professional as soon as possible: -allergic reactions like skin rash, itching or hives, swelling of the face, lips, or tongue -breast lumps -changes in vision -confusion, trouble speaking or understanding -dark urine -depressed mood -general ill feeling or flu-like symptoms -light-colored stools -loss of appetite, nausea -right upper belly pain -severe headaches -severe pain, swelling, or tenderness in the abdomen -shortness of breath, chest pain, swelling in a leg -signs of pregnancy -sudden numbness or weakness of the face, arm or leg -trouble walking, dizziness, loss of balance or coordination -unusual vaginal bleeding, discharge -unusually weak or tired -yellowing of the eyes or skin Side effects that usually do not require medical attention (Report these to your doctor or health care professional if they continue or are bothersome.): -acne -breast pain -changes in  weight -cough -fever or chills -headache -irregular menstrual bleeding -itching, burning, and   vaginal discharge -pain or difficulty passing urine -sore throat This list may not describe all possible side effects. Call your doctor for medical advice about side effects. You may report side effects to FDA at 1-800-FDA-1088. Where should I keep my medicine? This drug is given in a hospital or clinic and will not be stored at home. NOTE: This sheet is a summary. It may not cover all possible information. If you have questions about this medicine, talk to your doctor, pharmacist, or health care provider.  2015, [employer]. (2011-07-10 15:37:45)   Third Trimester of Pregnancy The third trimester is from week 29 through week 42, months 7 through 9. The third trimester is a time when the fetus is growing rapidly. At the end of the ninth month, the fetus is about 20 inches in length and weighs 6-10 pounds.  BODY CHANGES Your body goes through many changes during pregnancy. The changes vary from woman to woman.   Your weight will continue to increase. You can expect to gain 25-35 pounds (11-16 kg) by the end of the pregnancy.  You may begin to get stretch marks on your hips, abdomen, and breasts.  You may urinate more often because the fetus is moving lower into your pelvis and pressing on your bladder.  You may develop or continue to have heartburn as a result of your pregnancy.  You may develop constipation because certain hormones are causing the muscles that push waste through your intestines to slow down.  You may develop hemorrhoids or swollen, bulging veins (varicose veins).  You may have pelvic pain because of the weight gain and pregnancy hormones relaxing your joints between the bones in your pelvis. Backaches may result from overexertion of the muscles supporting your posture.  You may have changes in your hair. These can include thickening of your hair, rapid growth,  and changes in texture. Some women also have hair loss during or after pregnancy, or hair that feels dry or thin. Your hair will most likely return to normal after your baby is born.  Your breasts will continue to grow and be tender. A yellow discharge may leak from your breasts called colostrum.  Your belly button may stick out.  You may feel short of breath because of your expanding uterus.  You may notice the fetus "dropping," or moving lower in your abdomen.  You may have a bloody mucus discharge. This usually occurs a few days to a week before labor begins.  Your cervix becomes thin and soft (effaced) near your due date. WHAT TO EXPECT AT YOUR PRENATAL EXAMS  You will have prenatal exams every 2 weeks until week 36. Then, you will have weekly prenatal exams. During a routine prenatal visit:  You will be weighed to make sure you and the fetus are growing normally.  Your blood pressure is taken.  Your abdomen will be measured to track your baby's growth.  The fetal heartbeat will be listened to.  Any test results from the previous visit will be discussed.  You may have a cervical check near your due date to see if you have effaced. At around 36 weeks, your caregiver will check your cervix. At the same time, your caregiver will also perform a test on the secretions of the vaginal tissue. This test is to determine if a type of bacteria, Group B streptococcus, is present. Your caregiver will explain this further. Your caregiver may ask you:  What your birth plan is.  How you are feeling.  If you are feeling the baby move.  If you have had any abnormal symptoms, such as leaking fluid, bleeding, severe headaches, or abdominal cramping.  If you have any questions. Other tests or screenings that may be performed during your third trimester include:  Blood tests that check for low iron levels (anemia).  Fetal testing to check the health, activity level, and growth of the fetus.  Testing is done if you have certain medical conditions or if there are problems during the pregnancy. FALSE LABOR You may feel small, irregular contractions that eventually go away. These are called Braxton Hicks contractions, or false labor. Contractions may last for hours, days, or even weeks before true labor sets in. If contractions come at regular intervals, intensify, or become painful, it is best to be seen by your caregiver.  SIGNS OF LABOR   Menstrual-like cramps.  Contractions that are 5 minutes apart or less.  Contractions that start on the top of the uterus and spread down to the lower abdomen and back.  A sense of increased pelvic pressure or back pain.  A watery or bloody mucus discharge that comes from the vagina. If you have any of these signs before the 37th week of pregnancy, call your caregiver right away. You need to go to the hospital to get checked immediately. HOME CARE INSTRUCTIONS   Avoid all smoking, herbs, alcohol, and unprescribed drugs. These chemicals affect the formation and growth of the baby.  Follow your caregiver's instructions regarding medicine use. There are medicines that are either safe or unsafe to take during pregnancy.  Exercise only as directed by your caregiver. Experiencing uterine cramps is a good sign to stop exercising.  Continue to eat regular, healthy meals.  Wear a good support bra for breast tenderness.  Do not use hot tubs, steam rooms, or saunas.  Wear your seat belt at all times when driving.  Avoid raw meat, uncooked cheese, cat litter boxes, and soil used by cats. These carry germs that can cause birth defects in the baby.  Take your prenatal vitamins.  Try taking a stool softener (if your caregiver approves) if you develop constipation. Eat more high-fiber foods, such as fresh vegetables or fruit and whole grains. Drink plenty of fluids to keep your urine clear or pale yellow.  Take warm sitz baths to soothe any pain or  discomfort caused by hemorrhoids. Use hemorrhoid cream if your caregiver approves.  If you develop varicose veins, wear support hose. Elevate your feet for 15 minutes, 3-4 times a day. Limit salt in your diet.  Avoid heavy lifting, wear low heal shoes, and practice good posture.  Rest a lot with your legs elevated if you have leg cramps or low back pain.  Visit your dentist if you have not gone during your pregnancy. Use a soft toothbrush to brush your teeth and be gentle when you floss.  A sexual relationship may be continued unless your caregiver directs you otherwise.  Do not travel far distances unless it is absolutely necessary and only with the approval of your caregiver.  Take prenatal classes to understand, practice, and ask questions about the labor and delivery.  Make a trial run to the hospital.  Pack your hospital bag.  Prepare the baby's nursery.  Continue to go to all your prenatal visits as directed by your caregiver. SEEK MEDICAL CARE IF:  You are unsure if you are in labor or if your water has broken.  You have dizziness.  You have  mild pelvic cramps, pelvic pressure, or nagging pain in your abdominal area.  You have persistent nausea, vomiting, or diarrhea.  You have a bad smelling vaginal discharge.  You have pain with urination. SEEK IMMEDIATE MEDICAL CARE IF:   You have a fever.  You are leaking fluid from your vagina.  You have spotting or bleeding from your vagina.  You have severe abdominal cramping or pain.  You have rapid weight loss or gain.  You have shortness of breath with chest pain.  You notice sudden or extreme swelling of your face, hands, ankles, feet, or legs.  You have not felt your baby move in over an hour.  You have severe headaches that do not go away with medicine.  You have vision changes. Document Released: 12/27/2000 Document Revised: 01/07/2013 Document Reviewed: 03/05/2012 [facility] Patient Information 2015  [employer], [place]. This information is not intended to replace advice given to you by your health care provider. Make sure you discuss any questions you have with your health care provider.

## 2014-07-27 ENCOUNTER — Encounter: Payer: Self-pay | Admitting: *Deleted

## 2014-07-27 NOTE — Progress Notes (Signed)
Form completed for Social Services and faxed

## 2014-08-07 ENCOUNTER — Inpatient Hospital Stay (HOSPITAL_COMMUNITY)
Admission: AD | Admit: 2014-08-07 | Discharge: 2014-08-07 | Disposition: A | Discharge status: Home / Self Care | Payer: Medicaid Other | Source: Ambulatory Visit | Attending: Obstetrics & Gynecology | Admitting: Obstetrics & Gynecology

## 2014-08-07 ENCOUNTER — Encounter (HOSPITAL_COMMUNITY): Payer: Self-pay | Admitting: *Deleted

## 2014-08-07 DIAGNOSIS — R51 Headache: Secondary | ICD-10-CM | POA: Insufficient documentation

## 2014-08-07 DIAGNOSIS — R109 Unspecified abdominal pain: Secondary | ICD-10-CM | POA: Diagnosis present

## 2014-08-07 DIAGNOSIS — R519 Headache, unspecified: Secondary | ICD-10-CM

## 2014-08-07 DIAGNOSIS — Z3A31 31 weeks gestation of pregnancy: Secondary | ICD-10-CM | POA: Insufficient documentation

## 2014-08-07 DIAGNOSIS — Z3483 Encounter for supervision of other normal pregnancy, third trimester: Secondary | ICD-10-CM

## 2014-08-07 DIAGNOSIS — R197 Diarrhea, unspecified: Secondary | ICD-10-CM | POA: Insufficient documentation

## 2014-08-07 DIAGNOSIS — O4703 False labor before 37 completed weeks of gestation, third trimester: Secondary | ICD-10-CM

## 2014-08-07 DIAGNOSIS — K529 Noninfective gastroenteritis and colitis, unspecified: Secondary | ICD-10-CM | POA: Diagnosis not present

## 2014-08-07 DIAGNOSIS — O99013 Anemia complicating pregnancy, third trimester: Secondary | ICD-10-CM

## 2014-08-07 LAB — URINALYSIS, ROUTINE W REFLEX MICROSCOPIC
BILIRUBIN URINE: NEGATIVE
Glucose, UA: NEGATIVE mg/dL
KETONES UR: NEGATIVE mg/dL
Nitrite: NEGATIVE
PROTEIN: NEGATIVE mg/dL
Specific Gravity, Urine: 1.02 (ref 1.005–1.030)
UROBILINOGEN UA: 2 mg/dL — AB (ref 0.0–1.0)
pH: 6.5 (ref 5.0–8.0)

## 2014-08-07 LAB — URINE MICROSCOPIC-ADD ON

## 2014-08-07 MED ORDER — NIFEDIPINE 10 MG PO CAPS: 10.0000 mg | ORAL_CAPSULE | Freq: Four times a day (QID) | ORAL | Status: DC | PRN

## 2014-08-07 MED ORDER — NIFEDIPINE 10 MG PO CAPS
10.0000 mg | ORAL_CAPSULE | Freq: Once | ORAL | Status: AC
  Administered 2014-08-07: 10 mg via ORAL
  Filled 2014-08-07: qty 1

## 2014-08-07 NOTE — MAU Note (Signed)
Abdominal pain for 2 days, thick pink tinged discharge today. Decreased fetal movement. Bright red blood on toilet paper after voiding. Has constant headache for 8 days.

## 2014-08-07 NOTE — MAU Note (Signed)
Patient presents at [redacted] weeks gestation c/o abdominal pain X couple of days and a thick pinkish discharge today. Fetus active but not as much as usual. States she also sees blood on the toilet tissue after voiding.

## 2014-08-07 NOTE — MAU Provider Note (Signed)
History     CSN: [id number]  Arrival date and time: 08/07/14 1524   None     Chief Complaint  Patient presents with  . Vaginal Bleeding  . Abdominal Pain   This is a 23 y.o. female at [redacted]w[redacted]d who presents with c/o abdominal pain and pink discharge.  States the intermittent abdominal pain is usually only at night, for the last 2 weeks. States had pink discharge on tissue just today. Denies fever or nausea/vomiting. Has had diarrhea for a week. Multiple loose stools. Her son has had the same thing for a week or so.   Has had a headache off and on for a week. Used to take Fioricet but was afraid to use  In pregnancy. Headache is dull, comes and goes. No visual changes   Vaginal Bleeding The patient's primary symptoms include vaginal bleeding. The patient's pertinent negatives include no genital itching, genital lesions, genital odor or genital rash. This is a new problem. The current episode started yesterday. The problem occurs intermittently. The problem has been gradually improving. The pain is mild. She is pregnant. Associated symptoms include abdominal pain and diarrhea. Pertinent negatives include no back pain, chills, constipation, fever, nausea or vomiting. The vaginal discharge was bloody (pink). The vaginal bleeding is spotting. She has not been passing clots. She has not been passing tissue.    RN Note:  Expand All Collapse All   Abdominal pain for 2 days, thick pink tinged discharge today. Decreased fetal movement. Bright red blood on toilet paper after voiding. Has constant headache for 8 days.           OB History    Gravida Para Term Preterm AB TAB SAB Ectopic Multiple Living   2 1 1  0 0 0 0 0 0 1      Past Medical History  Diagnosis Date  . Asthma   . Migraine with aura 04/25/2012  . Depression   . Anxiety     Past Surgical History  Procedure Laterality Date  . Wisdom tooth extraction      Family History  Problem Relation Age of Onset  . Heart murmur  Mother   . Miscarriages / [place] Mother   . Diabetes Mother   . Autism Paternal Uncle   . Down syndrome Paternal Uncle   . Stroke Maternal Grandmother   . Diabetes Maternal Grandfather   . Hyperlipidemia Maternal Grandfather   . Stroke Maternal Grandfather     History  Substance Use Topics  . Smoking status: Never Smoker   . Smokeless tobacco: Never Used  . Alcohol Use: No    Allergies:  Allergies  Allergen Reactions  . Benadryl [Diphenhydramine Hcl] Other (See Comments)    Red Man's and Sjogren's  . Lorazepam     Patient states that it makes her "loopy" and "off the wall"    Prescriptions prior to admission  Medication Sig Dispense Refill Last Dose  . albuterol (PROVENTIL HFA;VENTOLIN HFA) 108 (90 BASE) MCG/ACT inhaler Inhale 2 puffs into the lungs every 6 (six) hours as needed for wheezing or shortness of breath.    Taking  . Iron Combinations (IRON COMPLEX PO) Take 1 capsule by mouth 2 (two) times daily.   Taking  . Prenatal Vit-Fe Fumarate-FA (PRENATAL MULTIVITAMIN) TABS tablet Take 1 tablet by mouth daily at 12 noon.   Taking   Medical, Surgical, Family and Social histories reviewed and are listed above.  Medications and allergies reviewed.    Review of Systems  Constitutional: Negative for fever, chills and malaise/fatigue.  Gastrointestinal: Positive for abdominal pain and diarrhea. Negative for nausea, vomiting and constipation.  Genitourinary: Positive for vaginal bleeding.  Musculoskeletal: Negative for back pain.  Neurological: Negative for dizziness and focal weakness.  Psychiatric/Behavioral: Negative for depression.  Other systems negative  Physical Exam   Blood pressure 117/64, pulse 88, temperature 98.8 F (37.1 C), temperature source Oral, resp. rate 16, height  (1.676 m), weight 162 lb 6 oz (73.653 kg), last menstrual period 01/05/2014, not currently breastfeeding.  Physical Exam  Constitutional: She is oriented to person, place, and  time. She appears well-developed and well-nourished. No distress.  HENT:  Head: Normocephalic.  Cardiovascular: Normal rate.   Respiratory: Effort normal. No respiratory distress.  GI: Soft. She exhibits no distension and no mass. There is no tenderness. There is no rebound and no guarding.  Genitourinary: Vaginal discharge (white creamy, no pink, no blood) found.  Dilation: 1 Effacement (%): 30 Station: -1 Exam by:: Aletta Edouard, CNM    Musculoskeletal: Normal range of motion.  Neurological: She is alert and oriented to person, place, and time.  Skin: Skin is warm and dry.  Psychiatric: She has a normal mood and affect.   FHR reactive Uterine irritability noted  MAU Course  Procedures  MDM Unable to do FFn due to report of bleeding Procardia x 1 dose given for uterine irritability States cramping improved after dose of Procardia  Results for orders placed or performed during the hospital encounter of 08/07/14 (from the past 24 hour(s))  Urinalysis, Routine w reflex microscopic (not at [facility])     Status: Abnormal   Collection Time: 08/07/14  3:52 PM  Result Value Ref Range   Color, Urine YELLOW YELLOW   APPearance CLEAR CLEAR   Specific Gravity, Urine 1.020 1.005 - 1.030   pH 6.5 5.0 - 8.0   Glucose, UA NEGATIVE NEGATIVE mg/dL   Hgb urine dipstick TRACE (A) NEGATIVE   Bilirubin Urine NEGATIVE NEGATIVE   Ketones, ur NEGATIVE NEGATIVE mg/dL   Protein, ur NEGATIVE NEGATIVE mg/dL   Urobilinogen, UA 2.0 (H) 0.0 - 1.0 mg/dL   Nitrite NEGATIVE NEGATIVE   Leukocytes, UA SMALL (A) NEGATIVE  Urine microscopic-add on     Status: Abnormal   Collection Time: 08/07/14  3:52 PM  Result Value Ref Range   Squamous Epithelial / LPF MANY (A) RARE   WBC, UA 0-2 <3 WBC/hpf   RBC / HPF 0-2 <3 RBC/hpf   Bacteria, UA MANY (A) RARE   Crystals CA OXALATE CRYSTALS (A) NEGATIVE   Urine-Other MUCOUS PRESENT    Discussed with Dr Emelda Fear. Suspect 1cm dilation may be related to multiparous  status.  Contractions are short, less than one minute long. No need for Betamethasone unless they recur and she changes her cervix  Assessment and Plan  A:  SIUP at [redacted]w[redacted]d       Uterine irritability, possibly due to gastroenteritis, improved with Procardia      Diarrhea, probably gastroenteritis gotten from her son      Headaches, history of migraines.  P:  Discharge home      Will Rx Nifedipine for PRN use at home,  every 6 hours PRN contractions, If no contractions, don't take it       Bleeding precautions       Pelvic rest       TYlenol or Fioricet for headaches       Advance diet as tolerated, may use Immodium if necessary,  but use sparingly       Call office if not improved in a few days       Stool culture (has not had any stools while here, despite report that she had one every 10 min at home)    Medication List    TAKE these medications        albuterol 108 (90 BASE) MCG/ACT inhaler  Commonly known as:  PROVENTIL HFA;VENTOLIN HFA  Inhale 2 puffs into the lungs every 6 (six) hours as needed for wheezing or shortness of breath.     IRON COMPLEX PO  Take 1 capsule by mouth daily.     NIFEdipine 10 MG capsule  Commonly known as:  PROCARDIA  Take 1 capsule (10 mg total) by mouth every 6 (six) hours as needed (contractions).     prenatal multivitamin Tabs tablet  Take 1 tablet by mouth daily at 12 noon.         Wynelle Bourgeois 08/07/2014, 5:02 PM

## 2014-08-09 NOTE — Discharge Instructions (Signed)

## 2014-08-12 ENCOUNTER — Ambulatory Visit (INDEPENDENT_AMBULATORY_CARE_PROVIDER_SITE_OTHER): Payer: Medicaid Other | Admitting: Family

## 2014-08-12 VITALS — BP 112/61 | HR 86 | Temp 98.1°F | Wt 162.8 lb

## 2014-08-12 DIAGNOSIS — Z3483 Encounter for supervision of other normal pregnancy, third trimester: Secondary | ICD-10-CM

## 2014-08-12 LAB — POCT URINALYSIS DIP (DEVICE)
Bilirubin Urine: NEGATIVE
GLUCOSE, UA: NEGATIVE mg/dL
KETONES UR: NEGATIVE mg/dL
Nitrite: NEGATIVE
PH: 7 (ref 5.0–8.0)
Protein, ur: NEGATIVE mg/dL
Specific Gravity, Urine: 1.015 (ref 1.005–1.030)
Urobilinogen, UA: 0.2 mg/dL (ref 0.0–1.0)

## 2014-08-12 NOTE — Progress Notes (Signed)
Subjective:  Monica Munoz is a 24 y.o. G2P1001 at [redacted]w[redacted]d being seen today for ongoing prenatal care.  Patient reports no complaints.  Contractions: Not present.  Vag. Bleeding: None. Movement: Present. Denies leaking of fluid.   The following portions of the patient's history were reviewed and updated as appropriate: allergies, current medications, past family history, past medical history, past social history, past surgical history and problem list.   Objective:   Filed Vitals:   08/12/14 1043  BP: 112/61  Pulse: 86  Temp: 98.1 F (36.7 C)  Weight: 162 lb 12.8 oz (73.846 kg)    Fetal Status: Fetal Heart Rate (bpm): 125 Fundal Height: 32 cm Movement: Present     General:  Alert, oriented and cooperative. Patient is in no acute distress.  Skin: Skin is warm and dry. No rash noted.   Cardiovascular: Normal heart rate noted  Respiratory: Normal respiratory effort, no problems with respiration noted  Abdomen: Soft, gravid, appropriate for gestational age. Pain/Pressure: Absent     Vaginal: Vag. Bleeding: None.       Cervix: Not evaluated        Extremities: Normal range of motion.  Edema: None  Mental Status: Normal mood and affect. Normal behavior. Normal judgment and thought content.   Urinalysis: Urine Protein: Negative Urine Glucose: Negative  Assessment and Plan:  Pregnancy: G2P1001 at [redacted]w[redacted]d  1. Supervision of normal pregnancy, antepartum, third trimester - Reviewed third trimester labs with patient.     Preterm labor symptoms and general obstetric precautions including but not limited to vaginal bleeding, contractions, leaking of fluid and fetal movement were reviewed in detail with the patient. Please refer to After Visit Summary for other counseling recommendations.  Return in about 2 weeks (around 08/26/2014).   Eino Farber Kennith Gain, CNM

## 2014-08-12 NOTE — Progress Notes (Signed)
Hgb: trace, leukocytes: small Breastfeeding tip of the week reviewed.

## 2014-08-12 NOTE — Patient Instructions (Signed)
AREA PEDIATRIC/FAMILY PRACTICE PHYSICIANS  [facility] [address] [place], [place] [address] Phone - [phone]   Fax - [phone]  [facility] [address] [place], [place]  [address] Phone - [phone]   Fax - [phone]  [facility] [address] [place], [place]  [address] Phone - [phone]   Fax - [phone]  [facility] [address] [place], [place]  [address] Phone - [phone]   Fax - [phone]  [facility] [address] [place], [place]  [address] Phone - [phone]   Fax - [phone]  [facility] [address] [place], [place]  [address] Phone - [phone]   Fax - [phone]  [facility] [address] [place], [place]  [address] Phone - [phone]   Fax - [phone]  [facility] [address] [place], [place]  [address] Phone - [phone]   Fax - [phone]  [facility] [address] [place], [place]  [address] Phone - [phone]   Fax - [phone] [facility] [address] [place], [place]  [address] Phone - [phone]   Fax - [phone]  [facility] [address] [place], [place]  [address] Phone - [phone]   Fax - [phone]  [facility] [address] [place], [place]  [address] Phone - [phone]   Fax - [phone]  [facility] [address] [place], [place]  [address] Phone - [phone]   Fax - [phone]  [facility] [address] [place], [place]  [address] Phone - [phone]  [facility] [address] [place], [place]  [address] Phone - [phone]   Fax - [phone]  [facility] [address] [place], [place]  [address] Phone - [phone]   Fax - [phone]  [facility] [address] [place], [place]  [address] Phone - [phone]   Fax - [phone]  [facility] [address] [place], [place]  [address] Phone - [phone]   Fax - [phone]   [facility] [address] [place], [place]  [address] Phone - [phone]   Fax - [phone]  [facility] [address] [place], [place]  [address] Phone - [phone]   Fax - [phone]  [facility] [address] [place], [place]  [address] Phone - [phone]   Fax - [phone]  [facility] [address] [place], [place]  [address] Phone - [phone]   Fax - [phone]  [facility] [address] , [place]  [address] Phone - [phone]   Fax - [phone] [facility] [address] [place], [place]  [address] Phone - [phone]   Fax - [phone]  [facility] [address] [place], [place]  [address] Phone - [phone]   Fax - [phone]  [facility] [address] [place],   [address] Phone - [phone]   Fax - [phone]   

## 2014-08-24 ENCOUNTER — Encounter: Payer: Self-pay | Admitting: General Practice

## 2014-08-26 ENCOUNTER — Ambulatory Visit (INDEPENDENT_AMBULATORY_CARE_PROVIDER_SITE_OTHER): Payer: Medicaid Other | Admitting: Advanced Practice Midwife

## 2014-08-26 ENCOUNTER — Encounter: Payer: Self-pay | Admitting: Advanced Practice Midwife

## 2014-08-26 ENCOUNTER — Encounter: Payer: Self-pay | Admitting: *Deleted

## 2014-08-26 VITALS — BP 116/67 | HR 97 | Temp 98.2°F | Wt 163.9 lb

## 2014-08-26 DIAGNOSIS — O4703 False labor before 37 completed weeks of gestation, third trimester: Secondary | ICD-10-CM

## 2014-08-26 DIAGNOSIS — O99013 Anemia complicating pregnancy, third trimester: Secondary | ICD-10-CM

## 2014-08-26 DIAGNOSIS — Z3483 Encounter for supervision of other normal pregnancy, third trimester: Secondary | ICD-10-CM | POA: Diagnosis present

## 2014-08-26 LAB — POCT URINALYSIS DIP (DEVICE)
BILIRUBIN URINE: NEGATIVE
Glucose, UA: NEGATIVE mg/dL
Hgb urine dipstick: NEGATIVE
Ketones, ur: NEGATIVE mg/dL
Nitrite: NEGATIVE
PH: 7.5 (ref 5.0–8.0)
Protein, ur: 30 mg/dL — AB
Specific Gravity, Urine: 1.015 (ref 1.005–1.030)
Urobilinogen, UA: 0.2 mg/dL (ref 0.0–1.0)

## 2014-08-26 NOTE — Progress Notes (Signed)
Moderate leukocytes noted on urinalysis. States has been having contractions everyday- usually at night- takes procardia but states it doesn't always stop the contractions.

## 2014-08-26 NOTE — Progress Notes (Signed)
Subjective:  Monica Munoz is a 24 y.o. G2P1001 at [redacted]w[redacted]d being seen today for ongoing prenatal care.  Patient reports Contractions mostly at night, but has them throughout day. Has to lie down. "hurts all the time"  Still taking Procardia .   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 Wants note to excuse her from that.   Contractions: Irregular.  Vag. Bleeding: None. Movement: Present. Denies leaking of fluid.   The following portions of the patient's history were reviewed and updated as appropriate: allergies, current medications, past family history, past medical history, past social history, past surgical history and problem list.   Objective:   Filed Vitals:   08/26/14 1125  BP: 116/67  Pulse: 97  Temp: 98.2 F (36.8 C)  Weight: 163 lb 14.4 oz (74.345 kg)    Fetal Status: Fetal Heart Rate (bpm): 125   Movement: Present     General:  Alert, oriented and cooperative. Patient is in no acute distress.  Skin: Skin is warm and dry. No rash noted.   Cardiovascular: Normal heart rate noted  Respiratory: Normal respiratory effort, no problems with respiration noted  Abdomen: Soft, gravid, appropriate for gestational age. Pain/Pressure: Present     Pelvic: Vag. Bleeding: None     Cervix unchanged from last exam, very posterior    Extremities: Normal range of motion.  Edema: Trace  Mental Status: Normal mood and affect. Normal behavior. Normal judgment and thought content.   Urinalysis: Urine Protein: 1+ Urine Glucose: Negative  Assessment and Plan:  Pregnancy: G2P1001 at [redacted]w[redacted]d  1. Supervision of normal pregnancy, antepartum, third trimester      Good Fetal movement. Normal prenatal care      Will give note for being out of volunteering until 09/03/14, at which time she will be 35 weeks. After that time, working/volunteering will not harm her or the baby.      Discussed normal to have increased UCs around midnight. As long as they don't  continue much after that it is ok.. Come in for painful UCs, more than 5/hr  2. Anemia during pregnancy in third trimester     Latest Hgb was 11.2, wnl for pregnancy  Preterm labor symptoms and general obstetric precautions including but not limited to vaginal bleeding, contractions, leaking of fluid and fetal movement were reviewed in detail with the patient. Please refer to After Visit Summary for other counseling recommendations.  Return in about 2 weeks (around 09/09/2014) for [facility].   Aviva Signs, CNM

## 2014-08-26 NOTE — Patient Instructions (Signed)
Third Trimester of Pregnancy The third trimester is from week 29 through week 42, months 7 through 9. The third trimester is a time when the fetus is growing rapidly. At the end of the ninth month, the fetus is about 20 inches in length and weighs 6-10 pounds.  BODY CHANGES Your body goes through many changes during pregnancy. The changes vary from woman to woman.   Your weight will continue to increase. You can expect to gain 25-35 pounds (11-16 kg) by the end of the pregnancy.  You may begin to get stretch marks on your hips, abdomen, and breasts.  You may urinate more often because the fetus is moving lower into your pelvis and pressing on your bladder.  You may develop or continue to have heartburn as a result of your pregnancy.  You may develop constipation because certain hormones are causing the muscles that push waste through your intestines to slow down.  You may develop hemorrhoids or swollen, bulging veins (varicose veins).  You may have pelvic pain because of the weight gain and pregnancy hormones relaxing your joints between the bones in your pelvis. Backaches may result from overexertion of the muscles supporting your posture.  You may have changes in your hair. These can include thickening of your hair, rapid growth, and changes in texture. Some women also have hair loss during or after pregnancy, or hair that feels dry or thin. Your hair will most likely return to normal after your baby is born.  Your breasts will continue to grow and be tender. A yellow discharge may leak from your breasts called colostrum.  Your belly button may stick out.  You may feel short of breath because of your expanding uterus.  You may notice the fetus "dropping," or moving lower in your abdomen.  You may have a bloody mucus discharge. This usually occurs a few days to a week before labor begins.  Your cervix becomes thin and soft (effaced) near your due date. WHAT TO EXPECT AT YOUR PRENATAL  EXAMS  You will have prenatal exams every 2 weeks until week 36. Then, you will have weekly prenatal exams. During a routine prenatal visit:  You will be weighed to make sure you and the fetus are growing normally.  Your blood pressure is taken.  Your abdomen will be measured to track your baby's growth.  The fetal heartbeat will be listened to.  Any test results from the previous visit will be discussed.  You may have a cervical check near your due date to see if you have effaced. At around 36 weeks, your caregiver will check your cervix. At the same time, your caregiver will also perform a test on the secretions of the vaginal tissue. This test is to determine if a type of bacteria, Group B streptococcus, is present. Your caregiver will explain this further. Your caregiver may ask you:  What your birth plan is.  How you are feeling.  If you are feeling the baby move.  If you have had any abnormal symptoms, such as leaking fluid, bleeding, severe headaches, or abdominal cramping.  If you have any questions. Other tests or screenings that may be performed during your third trimester include:  Blood tests that check for low iron levels (anemia).  Fetal testing to check the health, activity level, and growth of the fetus. Testing is done if you have certain medical conditions or if there are problems during the pregnancy. FALSE LABOR You may feel small, irregular contractions that   eventually go away. These are called Braxton Hicks contractions, or false labor. Contractions may last for hours, days, or even weeks before true labor sets in. If contractions come at regular intervals, intensify, or become painful, it is best to be seen by your caregiver.  SIGNS OF LABOR   Menstrual-like cramps.  Contractions that are 5 minutes apart or less.  Contractions that start on the top of the uterus and spread down to the lower abdomen and back.  A sense of increased pelvic pressure or back  pain.  A watery or bloody mucus discharge that comes from the vagina. If you have any of these signs before the 37th week of pregnancy, call your caregiver right away. You need to go to the hospital to get checked immediately. HOME CARE INSTRUCTIONS   Avoid all smoking, herbs, alcohol, and unprescribed drugs. These chemicals affect the formation and growth of the baby.  Follow your caregiver's instructions regarding medicine use. There are medicines that are either safe or unsafe to take during pregnancy.  Exercise only as directed by your caregiver. Experiencing uterine cramps is a good sign to stop exercising.  Continue to eat regular, healthy meals.  Wear a good support bra for breast tenderness.  Do not use hot tubs, steam rooms, or saunas.  Wear your seat belt at all times when driving.  Avoid raw meat, uncooked cheese, cat litter boxes, and soil used by cats. These carry germs that can cause birth defects in the baby.  Take your prenatal vitamins.  Try taking a stool softener (if your caregiver approves) if you develop constipation. Eat more high-fiber foods, such as fresh vegetables or fruit and whole grains. Drink plenty of fluids to keep your urine clear or pale yellow.  Take warm sitz baths to soothe any pain or discomfort caused by hemorrhoids. Use hemorrhoid cream if your caregiver approves.  If you develop varicose veins, wear support hose. Elevate your feet for 15 minutes, 3-4 times a day. Limit salt in your diet.  Avoid heavy lifting, wear low heal shoes, and practice good posture.  Rest a lot with your legs elevated if you have leg cramps or low back pain.  Visit your dentist if you have not gone during your pregnancy. Use a soft toothbrush to brush your teeth and be gentle when you floss.  A sexual relationship may be continued unless your caregiver directs you otherwise.  Do not travel far distances unless it is absolutely necessary and only with the approval  of your caregiver.  Take prenatal classes to understand, practice, and ask questions about the labor and delivery.  Make a trial run to the hospital.  Pack your hospital bag.  Prepare the baby's nursery.  Continue to go to all your prenatal visits as directed by your caregiver. SEEK MEDICAL CARE IF:  You are unsure if you are in labor or if your water has broken.  You have dizziness.  You have mild pelvic cramps, pelvic pressure, or nagging pain in your abdominal area.  You have persistent nausea, vomiting, or diarrhea.  You have a bad smelling vaginal discharge.  You have pain with urination. SEEK IMMEDIATE MEDICAL CARE IF:   You have a fever.  You are leaking fluid from your vagina.  You have spotting or bleeding from your vagina.  You have severe abdominal cramping or pain.  You have rapid weight loss or gain.  You have shortness of breath with chest pain.  You notice sudden or extreme swelling   of your face, hands, ankles, feet, or legs.  You have not felt your baby move in over an hour.  You have severe headaches that do not go away with medicine.  You have vision changes. Document Released: 12/27/2000 Document Revised: 01/07/2013 Document Reviewed: 03/05/2012 Exit[employer] Patient Information 2015 [employer]. This information is not intended to replace advice given to you by your health care provider. Make sure you discuss any questions you have with your health care provider.  

## 2014-09-02 ENCOUNTER — Encounter: Payer: Self-pay | Admitting: *Deleted

## 2014-09-09 ENCOUNTER — Ambulatory Visit (INDEPENDENT_AMBULATORY_CARE_PROVIDER_SITE_OTHER): Payer: Medicaid Other | Admitting: Certified Nurse Midwife

## 2014-09-09 VITALS — BP 128/72 | HR 90 | Temp 98.5°F | Wt 166.0 lb

## 2014-09-09 DIAGNOSIS — Z3483 Encounter for supervision of other normal pregnancy, third trimester: Secondary | ICD-10-CM

## 2014-09-09 LAB — POCT URINALYSIS DIP (DEVICE)
Bilirubin Urine: NEGATIVE
Glucose, UA: NEGATIVE mg/dL
Hgb urine dipstick: NEGATIVE
Ketones, ur: NEGATIVE mg/dL
Nitrite: NEGATIVE
Protein, ur: NEGATIVE mg/dL
Specific Gravity, Urine: 1.015 (ref 1.005–1.030)
Urobilinogen, UA: 0.2 mg/dL (ref 0.0–1.0)
pH: 7 (ref 5.0–8.0)

## 2014-09-09 NOTE — Progress Notes (Signed)
Subjective:  Monica Munoz is a 24 y.o. G2P1001 at [redacted]w[redacted]d being seen today for ongoing prenatal care.  Patient reports pelvic pain.  Contractions: Irregular.   . Movement: Present. Denies leaking of fluid.   The following portions of the patient's history were reviewed and updated as appropriate: allergies, current medications, past family history, past medical history, past social history, past surgical history and problem list.   Objective:   Filed Vitals:   09/09/14 1136  BP: 128/72  Pulse: 90  Temp: 98.5 F (36.9 C)  Weight: 166 lb (75.297 kg)    Fetal Status: Fetal Heart Rate (bpm): 135   Movement: Present     General:  Alert, oriented and cooperative. Patient is in no acute distress.  Skin: Skin is warm and dry. No rash noted.   Cardiovascular: Normal heart rate noted  Respiratory: Normal respiratory effort, no problems with respiration noted  Abdomen: Soft, gravid, appropriate for gestational age. Pain/Pressure: Absent     Pelvic:       Cervical exam deferred        Extremities: Normal range of motion.  Edema: None  Mental Status: Normal mood and affect. Normal behavior. Normal judgment and thought content.   Urinalysis:      Assessment and Plan:  Pregnancy: G2P1001 at [redacted]w[redacted]d  There are no diagnoses linked to this encounter. Preterm labor symptoms and general obstetric precautions including but not limited to vaginal bleeding, contractions, leaking of fluid and fetal movement were reviewed in detail with the patient. Please refer to After Visit Summary for other counseling recommendations.  Return in about 1 week (around 09/16/2014).   Rhea Pink, CNM

## 2014-09-09 NOTE — Patient Instructions (Signed)
Third Trimester of Pregnancy The third trimester is from week 29 through week 42, months 7 through 9. The third trimester is a time when the fetus is growing rapidly. At the end of the ninth month, the fetus is about 20 inches in length and weighs 6-10 pounds.  BODY CHANGES Your body goes through many changes during pregnancy. The changes vary from woman to woman.   Your weight will continue to increase. You can expect to gain 25-35 pounds (11-16 kg) by the end of the pregnancy.  You may begin to get stretch marks on your hips, abdomen, and breasts.  You may urinate more often because the fetus is moving lower into your pelvis and pressing on your bladder.  You may develop or continue to have heartburn as a result of your pregnancy.  You may develop constipation because certain hormones are causing the muscles that push waste through your intestines to slow down.  You may develop hemorrhoids or swollen, bulging veins (varicose veins).  You may have pelvic pain because of the weight gain and pregnancy hormones relaxing your joints between the bones in your pelvis. Backaches may result from overexertion of the muscles supporting your posture.  You may have changes in your hair. These can include thickening of your hair, rapid growth, and changes in texture. Some women also have hair loss during or after pregnancy, or hair that feels dry or thin. Your hair will most likely return to normal after your baby is born.  Your breasts will continue to grow and be tender. A yellow discharge may leak from your breasts called colostrum.  Your belly button may stick out.  You may feel short of breath because of your expanding uterus.  You may notice the fetus "dropping," or moving lower in your abdomen.  You may have a bloody mucus discharge. This usually occurs a few days to a week before labor begins.  Your cervix becomes thin and soft (effaced) near your due date. WHAT TO EXPECT AT YOUR PRENATAL  EXAMS  You will have prenatal exams every 2 weeks until week 36. Then, you will have weekly prenatal exams. During a routine prenatal visit:  You will be weighed to make sure you and the fetus are growing normally.  Your blood pressure is taken.  Your abdomen will be measured to track your baby's growth.  The fetal heartbeat will be listened to.  Any test results from the previous visit will be discussed.  You may have a cervical check near your due date to see if you have effaced. At around 36 weeks, your caregiver will check your cervix. At the same time, your caregiver will also perform a test on the secretions of the vaginal tissue. This test is to determine if a type of bacteria, Group B streptococcus, is present. Your caregiver will explain this further. Your caregiver may ask you:  What your birth plan is.  How you are feeling.  If you are feeling the baby move.  If you have had any abnormal symptoms, such as leaking fluid, bleeding, severe headaches, or abdominal cramping.  If you have any questions. Other tests or screenings that may be performed during your third trimester include:  Blood tests that check for low iron levels (anemia).  Fetal testing to check the health, activity level, and growth of the fetus. Testing is done if you have certain medical conditions or if there are problems during the pregnancy. FALSE LABOR You may feel small, irregular contractions that   eventually go away. These are called Braxton Hicks contractions, or false labor. Contractions may last for hours, days, or even weeks before true labor sets in. If contractions come at regular intervals, intensify, or become painful, it is best to be seen by your caregiver.  SIGNS OF LABOR   Menstrual-like cramps.  Contractions that are 5 minutes apart or less.  Contractions that start on the top of the uterus and spread down to the lower abdomen and back.  A sense of increased pelvic pressure or back  pain.  A watery or bloody mucus discharge that comes from the vagina. If you have any of these signs before the 37th week of pregnancy, call your caregiver right away. You need to go to the hospital to get checked immediately. HOME CARE INSTRUCTIONS   Avoid all smoking, herbs, alcohol, and unprescribed drugs. These chemicals affect the formation and growth of the baby.  Follow your caregiver's instructions regarding medicine use. There are medicines that are either safe or unsafe to take during pregnancy.  Exercise only as directed by your caregiver. Experiencing uterine cramps is a good sign to stop exercising.  Continue to eat regular, healthy meals.  Wear a good support bra for breast tenderness.  Do not use hot tubs, steam rooms, or saunas.  Wear your seat belt at all times when driving.  Avoid raw meat, uncooked cheese, cat litter boxes, and soil used by cats. These carry germs that can cause birth defects in the baby.  Take your prenatal vitamins.  Try taking a stool softener (if your caregiver approves) if you develop constipation. Eat more high-fiber foods, such as fresh vegetables or fruit and whole grains. Drink plenty of fluids to keep your urine clear or pale yellow.  Take warm sitz baths to soothe any pain or discomfort caused by hemorrhoids. Use hemorrhoid cream if your caregiver approves.  If you develop varicose veins, wear support hose. Elevate your feet for 15 minutes, 3-4 times a day. Limit salt in your diet.  Avoid heavy lifting, wear low heal shoes, and practice good posture.  Rest a lot with your legs elevated if you have leg cramps or low back pain.  Visit your dentist if you have not gone during your pregnancy. Use a soft toothbrush to brush your teeth and be gentle when you floss.  A sexual relationship may be continued unless your caregiver directs you otherwise.  Do not travel far distances unless it is absolutely necessary and only with the approval  of your caregiver.  Take prenatal classes to understand, practice, and ask questions about the labor and delivery.  Make a trial run to the hospital.  Pack your hospital bag.  Prepare the baby's nursery.  Continue to go to all your prenatal visits as directed by your caregiver. SEEK MEDICAL CARE IF:  You are unsure if you are in labor or if your water has broken.  You have dizziness.  You have mild pelvic cramps, pelvic pressure, or nagging pain in your abdominal area.  You have persistent nausea, vomiting, or diarrhea.  You have a bad smelling vaginal discharge.  You have pain with urination. SEEK IMMEDIATE MEDICAL CARE IF:   You have a fever.  You are leaking fluid from your vagina.  You have spotting or bleeding from your vagina.  You have severe abdominal cramping or pain.  You have rapid weight loss or gain.  You have shortness of breath with chest pain.  You notice sudden or extreme swelling   of your face, hands, ankles, feet, or legs.  You have not felt your baby move in over an hour.  You have severe headaches that do not go away with medicine.  You have vision changes. Document Released: 12/27/2000 Document Revised: 01/07/2013 Document Reviewed: 03/05/2012 Exit[employer] Patient Information 2015 [employer]. This information is not intended to replace advice given to you by your health care provider. Make sure you discuss any questions you have with your health care provider.  

## 2014-09-16 ENCOUNTER — Ambulatory Visit (INDEPENDENT_AMBULATORY_CARE_PROVIDER_SITE_OTHER): Payer: Medicaid Other | Admitting: Advanced Practice Midwife

## 2014-09-16 ENCOUNTER — Other Ambulatory Visit (HOSPITAL_COMMUNITY)
Admission: RE | Admit: 2014-09-16 | Discharge: 2014-09-16 | Disposition: A | Discharge status: Home / Self Care | Payer: Medicaid Other | Source: Ambulatory Visit | Attending: Advanced Practice Midwife | Admitting: Advanced Practice Midwife

## 2014-09-16 VITALS — BP 120/71 | HR 76 | Temp 98.2°F | Wt 169.0 lb

## 2014-09-16 DIAGNOSIS — Z113 Encounter for screening for infections with a predominantly sexual mode of transmission: Secondary | ICD-10-CM | POA: Diagnosis present

## 2014-09-16 DIAGNOSIS — Z3483 Encounter for supervision of other normal pregnancy, third trimester: Secondary | ICD-10-CM | POA: Diagnosis not present

## 2014-09-16 DIAGNOSIS — Z23 Encounter for immunization: Secondary | ICD-10-CM

## 2014-09-16 DIAGNOSIS — R319 Hematuria, unspecified: Secondary | ICD-10-CM

## 2014-09-16 LAB — POCT URINALYSIS DIP (DEVICE)
BILIRUBIN URINE: NEGATIVE
Glucose, UA: NEGATIVE mg/dL
KETONES UR: NEGATIVE mg/dL
Nitrite: NEGATIVE
PH: 7 (ref 5.0–8.0)
Protein, ur: NEGATIVE mg/dL
Specific Gravity, Urine: 1.015 (ref 1.005–1.030)
Urobilinogen, UA: 0.2 mg/dL (ref 0.0–1.0)

## 2014-09-16 LAB — OB RESULTS CONSOLE GBS: STREP GROUP B AG: NEGATIVE

## 2014-09-16 NOTE — Patient Instructions (Signed)
Breastfeeding Challenges and Solutions Even though breastfeeding is natural, it can be challenging, especially in the first few weeks after childbirth. It is normal for problems to arise when starting to breastfeed your new baby, even if you have breastfed before. This document provides some solutions to the most common breastfeeding challenges.  CHALLENGES AND SOLUTIONS Challenge--Cracked or Sore Nipples Cracked or sore nipples are commonly experienced by breastfeeding mothers. Cracked or sore nipples often are caused by inadequate latching (when your baby's mouth attaches to your breast to breastfeed). Soreness can also happen if your baby is not positioned properly at your breast. Although nipple cracking and soreness are common during the first week after birth, nipple pain is never normal. If you experience nipple cracking or soreness that lasts longer than 1 week or nipple pain, call your health care provider or lactation consultant.  Solution Ensure proper latching and positioning of your baby by following the steps below:  Find a comfortable place to sit or lie down, with your neck and back well supported.  Place a pillow or rolled up blanket under your baby to bring him or her to the level of your breast (if you are seated).  Make sure that your baby's abdomen is facing your abdomen.  Gently massage your breast. With your fingertips, massage from your chest wall toward your nipple in a circular motion. This encourages milk flow. You may need to continue this action during the feeding if your milk flows slowly.  Support your breast with 4 fingers underneath and your thumb above your nipple. Make sure your fingers are well away from your nipple and your baby's mouth.  Stroke your baby's lips gently with your finger or nipple.  When your baby's mouth is open wide enough, quickly bring your baby to your breast, placing your entire nipple and as much of the colored area around your nipple  (areola) as possible into your baby's mouth.  More areola should be visible above your baby's upper lip than below the lower lip.  Your baby's tongue should be between his or her lower gum and your breast.  Ensure that your baby's mouth is correctly positioned around your nipple (latched). Your baby's lips should create a seal on your breast and be turned out (everted).  It is common for your baby to suck for about 2-3 minutes in order to start the flow of breast milk. Signs that your baby has successfully latched on to your nipple include:   Quietly tugging or quietly sucking without causing you pain.   Swallowing heard between every 3-4 sucks.   Muscle movement above and in front of his or her ears with sucking.  Signs that your baby has not successfully latched on to nipple include:   Sucking sounds or smacking sounds from your baby while nursing.   Nipple pain.  Ensure that your breasts stay moisturized and healthy by:  Avoiding the use of soap on your nipples.   Wearing a supportive bra. Avoid wearing underwire-style bras or tight bras.   Air drying your nipples for 3-4 minutes after each feeding.   Using only cotton bra pads to absorb breast milk leakage. Leaking of breast milk between feedings is normal. Be sure to change the pads if they become soaked with milk.  Using lanolin on your nipples after nursing. Lanolin helps to maintain your skin's normal moisture barrier. If you use pure lanolin you do not need to wash it off before feeding your baby again. Pure lanolin   is not toxic to your baby. You may also hand express a few drops of breast milk and gently massage that milk into your nipples, allowing it to air dry. Challenge--Breast Engorgement Breast engorgement is the overfilling of your breasts with breast milk. In the first few weeks after giving birth, you may experience breast engorgement. Breast engorgement can make your breasts throb and feel hard, tightly  stretched, warm, and tender. Engorgement peaks about the fifth day after you give birth. Having breast engorgement does not mean you have to stop breastfeeding your baby. Solution  Breastfeed when you feel the need to reduce the fullness of your breasts or when your baby shows signs of hunger. This is called "breastfeeding on demand."  Newborns (babies younger than 4 weeks) often breastfeed every 1-3 hours during the day. You may need to awaken your baby to feed if he or she is asleep at a feeding time.  Do not allow your baby to sleep longer than 5 hours during the night without a feeding.  Pump or hand express breast milk before breastfeeding to soften your breast, areola, and nipple.  Apply warm, moist heat (in the shower or with warm water-soaked hand towels) just before feeding or pumping, or massage your breast before or during breastfeeding. This increases circulation and helps your milk to flow.  Completely empty your breasts when breastfeeding or pumping. Afterward, wear a snug bra (nursing or regular) or tank top for 1-2 days to signal your body to slightly decrease milk production. Only wear snug bras or tank tops to treat engorgement. Tight bras typically should be avoided by breastfeeding mothers. Once engorgement is relieved, return to wearing regular, loose-fitting clothes.  Apply ice packs to your breasts to lessen the pain from engorgement and relieve swelling, unless the ice is uncomfortable for you.  Do not delay feedings. Try to relax when it is time to feed your baby. This helps to trigger your "let-down reflex," which releases milk from your breast.  Ensure your baby is latched on to your breast and positioned properly while breastfeeding.  Allow your baby to remain at your breast as long as he or she is latched on well and actively sucking. Your baby will let you know when he or she is done breastfeeding by pulling away from your breast or falling asleep.  Avoid  introducing bottles or pacifiers to your baby in the early weeks of breastfeeding. Wait to introduce these things until after resolving any breastfeeding challenges.  Try to pump your milk on the same schedule as when your baby would breastfeed if you are returning to work or away from home for an extended period.  Drink plenty of fluids to avoid dehydration, which can eventually put you at greater risk of breast engorgement. If you follow these suggestions, your engorgement should improve in 24-48 hours. If you are still experiencing difficulty, call your lactation consultant or health care provider.  Challenge--Plugged Milk Ducts Plugged milk ducts occur when the duct does not drain milk effectively and becomes swollen. Wearing a tight-fitting nursing bra or having difficulty with latching may cause plugged milk ducts. Not drinking enough water (8-10 c [1.9-2.4 L] per day) can contribute to plugged milk ducts. Once a duct has become plugged, hard lumps, soreness, and redness may develop in your breast.  Solution Do not delay feedings. Feed your baby frequently and try to empty your breasts of milk at each feeding. Try breastfeeding from the affected side first so there is a   better chance that the milk will drain completely from that breast. Apply warm, moist towels to your breasts for 5-10 minutes before feeding. Alternatively, a hot shower right before breastfeeding can provide the moist heat that can encourage milk flow. Gentle massage of the sore area before and during a feeding may also help. Avoid wearing tight clothing or bras that put pressure on your breasts. Wear bras that offer good support to your breasts, but avoid underwire bras. If you have a plugged milk duct and develop a fever, you need to see your health care provider.  Challenge--Mastitis Mastitis is inflammation of your breast. It usually is caused by a bacterial infection and can cause flu-like symptoms. You may develop redness in  your breast and a fever. Often when mastitis occurs, your breast becomes firm, warm, and very painful. The most common causes of mastitis are poor latching, ineffective sucking from your baby, consistent pressure on your breast (possibly from wearing a tight-fitting bra or shirt that restricts the milk flow), unusual stress or fatigue, or missed feedings.  Solution You will be given antibiotic medicine to treat the infection. It is still important to breastfeed frequently to empty your breasts. Continuing to breastfeed while you recover from mastitis will not harm your baby. Make sure your baby is positioned properly during every feeding. Apply moist heat to your breasts for a few minutes before feeding to help the milk flow and to help your breasts empty more easily. Challenge--Thrush Thrush is a yeast infection that can form on your nipples, in your breast, or in your baby's mouth. It causes itching, soreness, burning or stabbing pain, and sometimes a rash.  Solution You will be given a medicated ointment for your nipples, and your baby will be given a liquid medicine for his or her mouth. It is important that you and your baby are treated at the same time because thrush can be passed between you and your baby. Change disposable nursing pads often. Any bras, towels, or clothing that come in contact with infected areas of your body or your baby's body need to be washed in very hot water every day. Wash your hands and your baby's hands often. All pacifiers, bottle nipples, or toys your baby puts in his or her mouth should be boiled once a day for 20 minutes. After 1 week of treatment, discard pacifiers and bottle nipples and buy new ones. All breast pump parts that touch the milk need to be boiled for 20 minutes every day. Challenge--Low Milk Supply You may not be producing enough milk if your baby is not gaining the proper amount of weight. Breast milk production is based on a supply-and-demand system. Your  milk supply depends on how frequently and effectively your baby empties your breast. Solution The more you breastfeed and pump, the more breast milk you will produce. It is important that your baby empties at least one of your breasts at each feeding. If this is not happening, then use a breast pump or hand express any milk that remains. This will help to drain as much milk as possible at each feeding. It will also signal your body to produce more milk. If your baby is not emptying your breasts, it may be due to latching, sucking, or positioning problems. If low milk supply continues after addressing these issues, contact your health care provider or a lactation specialist as soon as possible. Challenge--Inverted or Flat Nipples Some women have nipples that turn inward instead of protruding outward.   Other women have nipples that are flat. Inverted or flat nipples can sometimes make it more difficult for your baby to latch onto your breast. Solution You may be given a small device that pulls out inverted nipples. This device should be applied right before your baby is brought to your breast. You can also try using a breast pump for a short time before placing the baby at your breast. The pump can pull your nipple outwards to help your infant latch more easily. The baby's sucking motion will help the inverted nipple protrude as well.  If you have flat nipples, encourage your baby to latch onto your breast and feed frequently in the early days after birth. This will give your baby practice latching on correctly while your breast is still soft. When your milk supply increases, between the second and fifth day after birth and your breasts become full, your baby will have an easier time latching.  Contact a lactation consultant if you still have concerns. She or he can teach you additional techniques to address breastfeeding problems related to nipple shape and position.  FOR MORE INFORMATION [employer]: www.llli.org Document Released: 06/26/2005 Document Revised: 01/07/2013 Document Reviewed: 06/28/2012 [employer] Patient Information 2015 [employer]. This information is not intended to replace advice given to you by your health care provider. Make sure you discuss any questions you have with your health care provider.  

## 2014-09-16 NOTE — Progress Notes (Signed)
Breastfeeding tip of the week reviewed Urine, trace hgb. small wbcs Needs cultures today

## 2014-09-16 NOTE — Progress Notes (Signed)
Subjective:  Monica Munoz is a 24 y.o. G2P1001 at [redacted]w[redacted]d being seen today for ongoing prenatal care.  Patient reports increased pelvic pressure and contractions. Denies urinary complaints.  Contractions: Irregular.  Vag. Bleeding: None. Movement: Present. Denies leaking of fluid.   The following portions of the patient's history were reviewed and updated as appropriate: allergies, current medications, past family history, past medical history, past social history, past surgical history and problem list.   Objective:   Filed Vitals:   09/16/14 1004  BP: 120/71  Pulse: 76  Temp: 98.2 F (36.8 C)  Weight: 169 lb (76.658 kg)    Fetal Status: Fetal Heart Rate (bpm): 132   Movement: Present     General:  Alert, oriented and cooperative. Patient is in no acute distress.  Skin: Skin is warm and dry. No rash noted.   Cardiovascular: Normal heart rate noted  Respiratory: Normal respiratory effort, no problems with respiration noted  Abdomen: Soft, gravid, appropriate for gestational age. Pain/Pressure: Present     Pelvic: Vag. Bleeding: None     Cervical exam performed      1/50/-3  Extremities: Normal range of motion.  Edema: Trace  Mental Status: Normal mood and affect. Normal behavior. Normal judgment and thought content.   Urinalysis: Urine Protein: Negative Urine Glucose: Negative  Assessment and Plan:  Pregnancy: G2P1001 at [redacted]w[redacted]d  1. Supervision of normal pregnancy, antepartum, third trimester -GBS, GC/Chlamydia cultures  2. Hematuria  - Culture, OB Urine  3. Flu vaccine need  - Flu Vaccine QUAD 36+ mos IM; Standing - Flu Vaccine QUAD 36+ mos IM  Term labor symptoms and general obstetric precautions including but not limited to vaginal bleeding, contractions, leaking of fluid and fetal movement were reviewed in detail with the patient. Comfort measures, maternity support belt.  Please refer to After Visit Summary for other counseling recommendations.  Return in about 1 week  (around 09/23/2014).   Dorathy Kinsman, CNM

## 2014-09-17 LAB — GC/CHLAMYDIA PROBE AMP (~~LOC~~) NOT AT ARMC
CHLAMYDIA, DNA PROBE: NEGATIVE
Neisseria Gonorrhea: NEGATIVE

## 2014-09-17 LAB — CULTURE, OB URINE: Colony Count: 25000

## 2014-09-18 LAB — CULTURE, BETA STREP (GROUP B ONLY)

## 2014-09-23 ENCOUNTER — Inpatient Hospital Stay (HOSPITAL_COMMUNITY)
Admission: AD | Admit: 2014-09-23 | Discharge: 2014-09-23 | Disposition: A | Discharge status: Home / Self Care | Payer: Medicaid Other | Source: Ambulatory Visit | Attending: Family Medicine | Admitting: Family Medicine

## 2014-09-23 ENCOUNTER — Ambulatory Visit (INDEPENDENT_AMBULATORY_CARE_PROVIDER_SITE_OTHER): Payer: Medicaid Other | Admitting: Advanced Practice Midwife

## 2014-09-23 ENCOUNTER — Encounter (HOSPITAL_COMMUNITY): Payer: Self-pay | Admitting: *Deleted

## 2014-09-23 VITALS — BP 114/81 | HR 97 | Temp 98.5°F | Wt 167.1 lb

## 2014-09-23 DIAGNOSIS — Z3A37 37 weeks gestation of pregnancy: Secondary | ICD-10-CM | POA: Insufficient documentation

## 2014-09-23 DIAGNOSIS — Z3483 Encounter for supervision of other normal pregnancy, third trimester: Secondary | ICD-10-CM

## 2014-09-23 DIAGNOSIS — O368130 Decreased fetal movements, third trimester, not applicable or unspecified: Secondary | ICD-10-CM | POA: Diagnosis not present

## 2014-09-23 DIAGNOSIS — Z3689 Encounter for other specified antenatal screening: Secondary | ICD-10-CM

## 2014-09-23 DIAGNOSIS — O368131 Decreased fetal movements, third trimester, fetus 1: Secondary | ICD-10-CM

## 2014-09-23 LAB — POCT URINALYSIS DIP (DEVICE)
BILIRUBIN URINE: NEGATIVE
Glucose, UA: NEGATIVE mg/dL
HGB URINE DIPSTICK: NEGATIVE
Ketones, ur: NEGATIVE mg/dL
NITRITE: NEGATIVE
Protein, ur: NEGATIVE mg/dL
Specific Gravity, Urine: 1.015 (ref 1.005–1.030)
Urobilinogen, UA: 1 mg/dL (ref 0.0–1.0)
pH: 7 (ref 5.0–8.0)

## 2014-09-23 NOTE — MAU Note (Signed)
Sent up from clinic for monitoring

## 2014-09-23 NOTE — Patient Instructions (Signed)
Third Trimester of Pregnancy The third trimester is from week 29 through week 42, months 7 through 9. The third trimester is a time when the fetus is growing rapidly. At the end of the ninth month, the fetus is about 20 inches in length and weighs 6-10 pounds.  BODY CHANGES Your body goes through many changes during pregnancy. The changes vary from woman to woman.   Your weight will continue to increase. You can expect to gain 25-35 pounds (11-16 kg) by the end of the pregnancy.  You may begin to get stretch marks on your hips, abdomen, and breasts.  You may urinate more often because the fetus is moving lower into your pelvis and pressing on your bladder.  You may develop or continue to have heartburn as a result of your pregnancy.  You may develop constipation because certain hormones are causing the muscles that push waste through your intestines to slow down.  You may develop hemorrhoids or swollen, bulging veins (varicose veins).  You may have pelvic pain because of the weight gain and pregnancy hormones relaxing your joints between the bones in your pelvis. Backaches may result from overexertion of the muscles supporting your posture.  You may have changes in your hair. These can include thickening of your hair, rapid growth, and changes in texture. Some women also have hair loss during or after pregnancy, or hair that feels dry or thin. Your hair will most likely return to normal after your baby is born.  Your breasts will continue to grow and be tender. A yellow discharge may leak from your breasts called colostrum.  Your belly button may stick out.  You may feel short of breath because of your expanding uterus.  You may notice the fetus "dropping," or moving lower in your abdomen.  You may have a bloody mucus discharge. This usually occurs a few days to a week before labor begins.  Your cervix becomes thin and soft (effaced) near your due date. WHAT TO EXPECT AT YOUR PRENATAL  EXAMS  You will have prenatal exams every 2 weeks until week 36. Then, you will have weekly prenatal exams. During a routine prenatal visit:  You will be weighed to make sure you and the fetus are growing normally.  Your blood pressure is taken.  Your abdomen will be measured to track your baby's growth.  The fetal heartbeat will be listened to.  Any test results from the previous visit will be discussed.  You may have a cervical check near your due date to see if you have effaced. At around 36 weeks, your caregiver will check your cervix. At the same time, your caregiver will also perform a test on the secretions of the vaginal tissue. This test is to determine if a type of bacteria, Group B streptococcus, is present. Your caregiver will explain this further. Your caregiver may ask you:  What your birth plan is.  How you are feeling.  If you are feeling the baby move.  If you have had any abnormal symptoms, such as leaking fluid, bleeding, severe headaches, or abdominal cramping.  If you have any questions. Other tests or screenings that may be performed during your third trimester include:  Blood tests that check for low iron levels (anemia).  Fetal testing to check the health, activity level, and growth of the fetus. Testing is done if you have certain medical conditions or if there are problems during the pregnancy. FALSE LABOR You may feel small, irregular contractions that   eventually go away. These are called Braxton Hicks contractions, or false labor. Contractions may last for hours, days, or even weeks before true labor sets in. If contractions come at regular intervals, intensify, or become painful, it is best to be seen by your caregiver.  SIGNS OF LABOR   Menstrual-like cramps.  Contractions that are 5 minutes apart or less.  Contractions that start on the top of the uterus and spread down to the lower abdomen and back.  A sense of increased pelvic pressure or back  pain.  A watery or bloody mucus discharge that comes from the vagina. If you have any of these signs before the 37th week of pregnancy, call your caregiver right away. You need to go to the hospital to get checked immediately. HOME CARE INSTRUCTIONS   Avoid all smoking, herbs, alcohol, and unprescribed drugs. These chemicals affect the formation and growth of the baby.  Follow your caregiver's instructions regarding medicine use. There are medicines that are either safe or unsafe to take during pregnancy.  Exercise only as directed by your caregiver. Experiencing uterine cramps is a good sign to stop exercising.  Continue to eat regular, healthy meals.  Wear a good support bra for breast tenderness.  Do not use hot tubs, steam rooms, or saunas.  Wear your seat belt at all times when driving.  Avoid raw meat, uncooked cheese, cat litter boxes, and soil used by cats. These carry germs that can cause birth defects in the baby.  Take your prenatal vitamins.  Try taking a stool softener (if your caregiver approves) if you develop constipation. Eat more high-fiber foods, such as fresh vegetables or fruit and whole grains. Drink plenty of fluids to keep your urine clear or pale yellow.  Take warm sitz baths to soothe any pain or discomfort caused by hemorrhoids. Use hemorrhoid cream if your caregiver approves.  If you develop varicose veins, wear support hose. Elevate your feet for 15 minutes, 3-4 times a day. Limit salt in your diet.  Avoid heavy lifting, wear low heal shoes, and practice good posture.  Rest a lot with your legs elevated if you have leg cramps or low back pain.  Visit your dentist if you have not gone during your pregnancy. Use a soft toothbrush to brush your teeth and be gentle when you floss.  A sexual relationship may be continued unless your caregiver directs you otherwise.  Do not travel far distances unless it is absolutely necessary and only with the approval  of your caregiver.  Take prenatal classes to understand, practice, and ask questions about the labor and delivery.  Make a trial run to the hospital.  Pack your hospital bag.  Prepare the baby's nursery.  Continue to go to all your prenatal visits as directed by your caregiver. SEEK MEDICAL CARE IF:  You are unsure if you are in labor or if your water has broken.  You have dizziness.  You have mild pelvic cramps, pelvic pressure, or nagging pain in your abdominal area.  You have persistent nausea, vomiting, or diarrhea.  You have a bad smelling vaginal discharge.  You have pain with urination. SEEK IMMEDIATE MEDICAL CARE IF:   You have a fever.  You are leaking fluid from your vagina.  You have spotting or bleeding from your vagina.  You have severe abdominal cramping or pain.  You have rapid weight loss or gain.  You have shortness of breath with chest pain.  You notice sudden or extreme swelling   of your face, hands, ankles, feet, or legs.  You have not felt your baby move in over an hour.  You have severe headaches that do not go away with medicine.  You have vision changes. Document Released: 12/27/2000 Document Revised: 01/07/2013 Document Reviewed: 03/05/2012 Exit[employer] Patient Information 2015 [employer]. This information is not intended to replace advice given to you by your health care provider. Make sure you discuss any questions you have with your health care provider.  

## 2014-09-23 NOTE — MAU Provider Note (Signed)
Chief Complaint:  Decreased Fetal Movement   None      HPI: Monica Munoz is a 24 y.o. G2P1001 at [redacted]w[redacted]d who presents to maternity admissions sent from clinic for decreased fetal movement today and yesterday. She reports feeling less movement yesterday and no movement before her clinic appt this morning. Since arrival in MAU and placement of monitors, she is feeling good fetal movement. She denies regular contractions, LOF, vaginal bleeding, vaginal itching/burning, urinary symptoms, h/a, dizziness, n/v, or fever/chills.    HPI  Past Medical History: Past Medical History  Diagnosis Date  . Asthma   . Migraine with aura 04/25/2012  . Depression   . Anxiety     Past obstetric history: OB History  Gravida Para Term Preterm AB SAB TAB Ectopic Multiple Living  2 1 1  0 0 0 0 0 0 1    # Outcome Date GA Lbr Len/2nd Weight Sex Delivery Anes PTL Lv  2 Current           1 Term 05/27/13 [redacted]w[redacted]d 02:30 / 02:19 3.92 kg (8 lb 10.3 oz) M Vag-Spont EPI  Y      Past Surgical History: Past Surgical History  Procedure Laterality Date  . Wisdom tooth extraction      Family History: Family History  Problem Relation Age of Onset  . Heart murmur Mother   . Miscarriages / [place] Mother   . Diabetes Mother   . Autism Paternal Uncle   . Down syndrome Paternal Uncle   . Stroke Maternal Grandmother   . Diabetes Maternal Grandfather   . Hyperlipidemia Maternal Grandfather   . Stroke Maternal Grandfather     Social History: Social History  Substance Use Topics  . Smoking status: Never Smoker   . Smokeless tobacco: Never Used  . Alcohol Use: No    Allergies:  Allergies  Allergen Reactions  . Benadryl [Diphenhydramine Hcl] Other (See Comments)    Red Man's and Sjogren's  . Lorazepam     Patient states that it makes her "loopy" and "off the wall"    Meds:  Prescriptions prior to admission  Medication Sig Dispense Refill Last Dose  . albuterol (PROVENTIL HFA;VENTOLIN HFA) 108 (90  BASE) MCG/ACT inhaler Inhale 2 puffs into the lungs every 6 (six) hours as needed for wheezing or shortness of breath.    Taking  . Iron Combinations (IRON COMPLEX PO) Take 1 capsule by mouth daily.    Not Taking  . NIFEdipine (PROCARDIA) 10 MG capsule Take 1 capsule (10 mg total) by mouth every 6 (six) hours as needed (contractions). (Patient not taking: Reported on 09/16/2014) 30 capsule 0 Not Taking  . Prenatal Vit-Fe Fumarate-FA (PRENATAL MULTIVITAMIN) TABS tablet Take 1 tablet by mouth daily at 12 noon.   Taking    ROS:  Review of Systems  Constitutional: Negative for fever, chills and fatigue.  HENT: Negative for sinus pressure.   Eyes: Negative for photophobia.  Respiratory: Negative for shortness of breath.   Cardiovascular: Negative for chest pain.  Gastrointestinal: Negative for nausea, vomiting, diarrhea and constipation.  Genitourinary: Negative for dysuria, frequency, flank pain, vaginal bleeding, vaginal discharge, difficulty urinating, vaginal pain and pelvic pain.  Musculoskeletal: Negative for neck pain.  Neurological: Negative for dizziness, weakness and headaches.  Psychiatric/Behavioral: Negative.      I have reviewed patient's Past Medical Hx, Surgical Hx, Family Hx, Social Hx, medications and allergies.   Physical Exam  No data found.  Constitutional: Well-developed, well-nourished female in no acute distress.  Cardiovascular: normal rate Respiratory: normal effort GI: Abd soft, non-tender, gravid appropriate for gestational age.  MS: Extremities nontender, no edema, normal ROM Neurologic: Alert and oriented x 4.  GU: Neg CVAT.      FHT:  Baseline 125 , moderate variability, accelerations present, no decelerations Contractions: None on toco or to palpation   Labs: Results for orders placed or performed in visit on 09/23/14 (from the past 24 hour(s))  POCT urinalysis dip (device)     Status: Abnormal   Collection Time: 09/23/14 11:53 AM  Result Value  Ref Range   Glucose, UA NEGATIVE NEGATIVE mg/dL   Bilirubin Urine NEGATIVE NEGATIVE   Ketones, ur NEGATIVE NEGATIVE mg/dL   Specific Gravity, Urine 1.015 1.005 - 1.030   Hgb urine dipstick NEGATIVE NEGATIVE   pH 7.0 5.0 - 8.0   Protein, ur NEGATIVE NEGATIVE mg/dL   Urobilinogen, UA 1.0 0.0 - 1.0 mg/dL   Nitrite NEGATIVE NEGATIVE   Leukocytes, UA SMALL (A) NEGATIVE   A/POS/-- (03/31 1010)  Imaging:  No results found.  MAU Course/MDM: I have ordered labs and reviewed results.  Reviewed FHR tracing. Discussed normal FHR tracing with pt.  Pt stable at time of discharge.  Assessment: 1. Decreased fetal movement, third trimester, fetus 1   2. NST (non-stress test) reactive     Plan: Discharge home Labor precautions and fetal kick counts  Follow-up Information    Follow up with [facility].   Specialty:  Obstetrics and Gynecology   Why:  As scheduled on Wednesday   Contact information:   [address] [place] [address] [phone]      Follow up with [facility]  MATERN[facility] ADMISS[facility].   Why:  As needed for emergencies   Contact information:   [address] [id number] mc [place] [address] [phone]       Medication List    STOP taking these medications        NIFEdipine 10 MG capsule  Commonly known as:  PROCARDIA      TAKE these medications        albuterol 108 (90 BASE) MCG/ACT inhaler  Commonly known as:  PROVENTIL HFA;VENTOLIN HFA  Inhale 2 puffs into the lungs every 6 (six) hours as needed for wheezing or shortness of breath.     IRON COMPLEX PO  Take 1 capsule by mouth daily.     prenatal multivitamin Tabs tablet  Take 1 tablet by mouth daily at 12 noon.        Sharen Counter Certified Nurse-Midwife 09/23/2014 1:40 PM

## 2014-09-23 NOTE — Progress Notes (Signed)
Subjective:  Monica Munoz is a 24 y.o. G2P1001 at [redacted]w[redacted]d being seen today for ongoing prenatal care.  Patient reports Decreased FM.  Contractions: Not present.  Vag. Bleeding: None. Movement: (!) Decreased. Denies leaking of fluid.   The following portions of the patient's history were reviewed and updated as appropriate: allergies, current medications, past family history, past medical history, past social history, past surgical history and problem list.   Objective:   Filed Vitals:   09/23/14 1200  BP: 114/81  Pulse: 97  Temp: 98.5 F (36.9 C)  Weight: 167 lb 1.6 oz (75.796 kg)    Fetal Status: Fetal Heart Rate (bpm): 124   Movement: (!) Decreased     General:  Alert, oriented and cooperative. Patient is in no acute distress.  Skin: Skin is warm and dry. No rash noted.   Cardiovascular: Normal heart rate noted  Respiratory: Normal respiratory effort, no problems with respiration noted  Abdomen: Soft, gravid, appropriate for gestational age. Pain/Pressure: Present     Pelvic: Vag. Bleeding: None     Cervical exam deferred        Extremities: Normal range of motion.  Edema: None  Mental Status: Normal mood and affect. Normal behavior. Normal judgment and thought content.   Urinalysis: Urine Protein: Negative Urine Glucose: Negative  Assessment and Plan:  Pregnancy: G2P1001 at [redacted]w[redacted]d  1. Encounter for supervision of other normal pregnancy in third trimester   Term labor symptoms and general obstetric precautions including but not limited to vaginal bleeding, contractions, leaking of fluid and fetal movement were reviewed in detail with the patient. Please refer to After Visit Summary for other counseling recommendations.  To MAU for NST Return in about 1 week (around 09/30/2014).   Dorathy Kinsman, CNM

## 2014-09-23 NOTE — Progress Notes (Addendum)
Baleria c/o marked decrease in fetal movement since yesterday. Fetal monitoring ordered- report called to MAU charge nurse Jolynn. Patient taken to MAU for fetal testing after prenatal visit completed.

## 2014-09-23 NOTE — MAU Note (Signed)
Dr. Redmond Baseman to call back after looking at strip for d/c to home.

## 2014-09-23 NOTE — Discharge Instructions (Signed)
Fetal Movement Counts Patient Name: __________________________________________________ Patient Due Date: ____________________ Performing a fetal movement count is highly recommended in high-risk pregnancies, but it is good for every pregnant woman to do. Your health care provider may ask you to start counting fetal movements at 28 weeks of the pregnancy. Fetal movements often increase:  After eating a full meal.  After physical activity.  After eating or drinking something sweet or cold.  At rest. Pay attention to when you feel the baby is most active. This will help you notice a pattern of your baby's sleep and wake cycles and what factors contribute to an increase in fetal movement. It is important to perform a fetal movement count at the same time each day when your baby is normally most active.  HOW TO COUNT FETAL MOVEMENTS 1. Find a quiet and comfortable area to sit or lie down on your left side. Lying on your left side provides the best blood and oxygen circulation to your baby. 2. Write down the day and time on a sheet of paper or in a journal. 3. Start counting kicks, flutters, swishes, rolls, or jabs in a 2-hour period. You should feel at least 10 movements within 2 hours. 4. If you do not feel 10 movements in 2 hours, wait 2-3 hours and count again. Look for a change in the pattern or not enough counts in 2 hours. SEEK MEDICAL CARE IF:  You feel less than 10 counts in 2 hours, tried twice.  There is no movement in over an hour.  The pattern is changing or taking longer each day to reach 10 counts in 2 hours.  You feel the baby is not moving as he or she usually does. Date: ____________ Movements: ____________ Start time: ____________ Doreatha Zufall time: ____________  Date: ____________ Movements: ____________ Start time: ____________ Doreatha Neidhardt time: ____________ Date: ____________ Movements: ____________ Start time: ____________ Doreatha Carstens time: ____________ Date: ____________ Movements:  ____________ Start time: ____________ Doreatha Speas time: ____________ Date: ____________ Movements: ____________ Start time: ____________ Doreatha Pae time: ____________ Date: ____________ Movements: ____________ Start time: ____________ Doreatha Fukushima time: ____________ Date: ____________ Movements: ____________ Start time: ____________ Doreatha Rollings time: ____________ Date: ____________ Movements: ____________ Start time: ____________ Doreatha Hapke time: ____________  Date: ____________ Movements: ____________ Start time: ____________ Doreatha Walkowiak time: ____________ Date: ____________ Movements: ____________ Start time: ____________ Doreatha Voigt time: ____________ Date: ____________ Movements: ____________ Start time: ____________ Doreatha Muhammed time: ____________ Date: ____________ Movements: ____________ Start time: ____________ Doreatha Reeves time: ____________ Date: ____________ Movements: ____________ Start time: ____________ Doreatha Krantz time: ____________ Date: ____________ Movements: ____________ Start time: ____________ Doreatha Bledsoe time: ____________ Date: ____________ Movements: ____________ Start time: ____________ Doreatha Jentsch time: ____________ Date: ____________ Movements: ____________ Start time: ____________ Doreatha Bellino time: ____________ Date: ____________ Movements: ____________ Start time: ____________ Doreatha Pointer time: ____________  Date: ____________ Movements: ____________ Start time: ____________ Doreatha Blum time: ____________ Date: ____________ Movements: ____________ Start time: ____________ Doreatha Fowers time: ____________ Date: ____________ Movements: ____________ Start time: ____________ Doreatha Adolph time: ____________ Date: ____________ Movements: ____________ Start time: ____________ Doreatha Varnadore time: ____________ Date: ____________ Movements: ____________ Start time: ____________ Doreatha Bonawitz time: ____________ Date: ____________ Movements: ____________ Start time: ____________ Doreatha Patane time: ____________ Date: ____________ Movements: ____________ Start time: ____________ Doreatha Dineen  time: ____________  Date: ____________ Movements: ____________ Start time: ____________ Doreatha Bosher time: ____________ Date: ____________ Movements: ____________ Start time: ____________ Doreatha Louderback time: ____________ Date: ____________ Movements: ____________ Start time: ____________ Doreatha Bowley time: ____________ Date: ____________ Movements: ____________ Start time: ____________ Doreatha Sens time: ____________ Date: ____________ Movements: ____________ Start time: ____________ Doreatha Cederberg time: ____________ Date: ____________ Movements: ____________ Start time: ____________ Doreatha Roen time:  ____________ Date: ____________ Movements: ____________ Start time: ____________ Doreatha Wheless time: ____________  Date: ____________ Movements: ____________ Start time: ____________ Doreatha Rubalcava time: ____________ Date: ____________ Movements: ____________ Start time: ____________ Doreatha Morones time: ____________ Date: ____________ Movements: ____________ Start time: ____________ Doreatha Marte time: ____________ Date: ____________ Movements: ____________ Start time: ____________ Doreatha Cocke time: ____________ Date: ____________ Movements: ____________ Start time: ____________ Doreatha Pine time: ____________ Date: ____________ Movements: ____________ Start time: ____________ Doreatha Brucker time: ____________ Document Released: 02/01/2006 Document Revised: 05/19/2013 Document Reviewed: 10/30/2011 Exit[employer] Patient Information 2015 [employer]. This information is not intended to replace advice given to you by your health care provider. Make sure you discuss any questions you have with your health care provider.

## 2014-09-28 ENCOUNTER — Ambulatory Visit (INDEPENDENT_AMBULATORY_CARE_PROVIDER_SITE_OTHER): Payer: Medicaid Other | Admitting: Obstetrics & Gynecology

## 2014-09-28 VITALS — BP 130/79 | HR 87 | Temp 98.7°F | Wt 169.3 lb

## 2014-09-28 DIAGNOSIS — J4520 Mild intermittent asthma, uncomplicated: Secondary | ICD-10-CM

## 2014-09-28 DIAGNOSIS — Z3483 Encounter for supervision of other normal pregnancy, third trimester: Secondary | ICD-10-CM | POA: Diagnosis not present

## 2014-09-28 DIAGNOSIS — O99013 Anemia complicating pregnancy, third trimester: Secondary | ICD-10-CM

## 2014-09-28 LAB — POCT URINALYSIS DIP (DEVICE)
Bilirubin Urine: NEGATIVE
Glucose, UA: NEGATIVE mg/dL
Hgb urine dipstick: NEGATIVE
Ketones, ur: 15 mg/dL — AB
Nitrite: NEGATIVE
Protein, ur: 30 mg/dL — AB
Specific Gravity, Urine: 1.015 (ref 1.005–1.030)
Urobilinogen, UA: 1 mg/dL (ref 0.0–1.0)
pH: 7 (ref 5.0–8.0)

## 2014-09-28 NOTE — Progress Notes (Signed)
Subjective:  Monica Munoz is a 24 y.o. G2P1001 at [redacted]w[redacted]d being seen today for ongoing prenatal care.  Patient reports pelvic cramping.  Contractions: Irritability.  Vag. Bleeding: None. Movement: Present. Denies leaking of fluid.   The following portions of the patient's history were reviewed and updated as appropriate: allergies, current medications, past family history, past medical history, past social history, past surgical history and problem list.   Objective:   Filed Vitals:   09/28/14 1442 09/28/14 1448  BP: 135/75 130/79  Pulse: 87   Temp: 98.7 F (37.1 C)   Weight: 169 lb 4.8 oz (76.794 kg)     Fetal Status: Fetal Heart Rate (bpm): 140 Fundal Height: 38 cm Movement: Present  Presentation: Vertex  General:  Alert, oriented and cooperative. Patient is in no acute distress.  Skin: Skin is warm and dry. No rash noted.   Cardiovascular: Normal heart rate noted  Respiratory: Normal respiratory effort, no problems with respiration noted  Abdomen: Soft, gravid, appropriate for gestational age. Pain/Pressure: Present     Pelvic: Vag. Bleeding: None     Cervical exam performed Dilation: 1.5 Effacement (%): 50 Station: Ballotable  Extremities: Normal range of motion.  Edema: Trace  Mental Status: Normal mood and affect. Normal behavior. Normal judgment and thought content.   Urinalysis: Urine Protein: 1+ Urine Glucose: Negative  Assessment and Plan:  Pregnancy: G2P1001 at [redacted]w[redacted]d  1. Supervision of normal pregnancy, antepartum, third trimester BH ctx  2. Asthma, mild intermittent, uncomplicated stable  3. Anemia during pregnancy in third trimester stable  Term labor symptoms and general obstetric precautions including but not limited to vaginal bleeding, contractions, leaking of fluid and fetal movement were reviewed in detail with the patient. Please refer to After Visit Summary for other counseling recommendations.  Return in about 1 week (around 10/05/2014).   Willodean Rosenthal, MD

## 2014-09-28 NOTE — Progress Notes (Signed)
Breastfeeding tip of the week reviewed Pt states she has a rash all over body that is concerning Pt desires cervical exam Ketones: 15, Leukocytes: small

## 2014-09-28 NOTE — Patient Instructions (Signed)

## 2014-10-05 ENCOUNTER — Ambulatory Visit (INDEPENDENT_AMBULATORY_CARE_PROVIDER_SITE_OTHER): Payer: Medicaid Other | Admitting: Advanced Practice Midwife

## 2014-10-05 VITALS — BP 122/70 | HR 88 | Temp 98.5°F | Wt 165.7 lb

## 2014-10-05 DIAGNOSIS — Z3483 Encounter for supervision of other normal pregnancy, third trimester: Secondary | ICD-10-CM

## 2014-10-05 LAB — POCT URINALYSIS DIP (DEVICE)
Bilirubin Urine: NEGATIVE
Glucose, UA: NEGATIVE mg/dL
HGB URINE DIPSTICK: NEGATIVE
Ketones, ur: NEGATIVE mg/dL
Nitrite: NEGATIVE
PH: 7 (ref 5.0–8.0)
Protein, ur: NEGATIVE mg/dL
Specific Gravity, Urine: 1.015 (ref 1.005–1.030)
Urobilinogen, UA: 1 mg/dL (ref 0.0–1.0)

## 2014-10-05 NOTE — Progress Notes (Signed)
Breastfeeding tip of the week reviewed. 

## 2014-10-05 NOTE — Progress Notes (Signed)
Subjective:  Monica Munoz is a 24 y.o. G2P1001 at [redacted]w[redacted]d being seen today for ongoing prenatal care.  Patient reports fatigue and occasional contractions.  Contractions: Not present.  Vag. Bleeding: None. Movement: Present. Denies leaking of fluid.   The following portions of the patient's history were reviewed and updated as appropriate: allergies, current medications, past family history, past medical history, past social history, past surgical history and problem list.   Objective:   Filed Vitals:   10/05/14 1326  BP: 122/70  Pulse: 88  Temp: 98.5 F (36.9 C)  Weight: 165 lb 11.2 oz (75.161 kg)    Fetal Status: Fetal Heart Rate (bpm): 125   Movement: Present     General:  Alert, oriented and cooperative. Patient is in no acute distress.  Skin: Skin is warm and dry. No rash noted.   Cardiovascular: Normal heart rate noted  Respiratory: Normal respiratory effort, no problems with respiration noted  Abdomen: Soft, gravid, appropriate for gestational age. Pain/Pressure: Present     Pelvic: Vag. Bleeding: None     Cervical exam performed       Cx 1-2/70/-3/vertex. Membranes swept. +bloodyshow  Extremities: Normal range of motion.  Edema: Trace  Mental Status: Normal mood and affect. Normal behavior. Normal judgment and thought content.   Urinalysis: Urine Protein: Negative Urine Glucose: Negative  Assessment and Plan:  Pregnancy: G2P1001 at [redacted]w[redacted]d  1. Supervision of normal pregnancy, antepartum, third trimester      Membranes swept per patient request. Reviewed signs of labor. Discussed bloody show as result of sweeping.   Term labor symptoms and general obstetric precautions including but not limited to vaginal bleeding, contractions, leaking of fluid and fetal movement were reviewed in detail with the patient. Please refer to After Visit Summary for other counseling recommendations.  Return in about 1 week (around 10/12/2014) for [facility].   Aviva Signs, CNM

## 2014-10-05 NOTE — Patient Instructions (Signed)

## 2014-10-08 ENCOUNTER — Encounter (HOSPITAL_COMMUNITY): Payer: Self-pay | Admitting: *Deleted

## 2014-10-08 ENCOUNTER — Inpatient Hospital Stay (HOSPITAL_COMMUNITY)
Admission: AD | Admit: 2014-10-08 | Discharge: 2014-10-08 | Disposition: A | Discharge status: Home / Self Care | Payer: Medicaid Other | Source: Ambulatory Visit | Attending: Obstetrics and Gynecology | Admitting: Obstetrics and Gynecology

## 2014-10-08 DIAGNOSIS — Z3A40 40 weeks gestation of pregnancy: Secondary | ICD-10-CM | POA: Diagnosis not present

## 2014-10-08 DIAGNOSIS — O4703 False labor before 37 completed weeks of gestation, third trimester: Secondary | ICD-10-CM

## 2014-10-08 DIAGNOSIS — O99013 Anemia complicating pregnancy, third trimester: Secondary | ICD-10-CM

## 2014-10-08 DIAGNOSIS — Z3483 Encounter for supervision of other normal pregnancy, third trimester: Secondary | ICD-10-CM

## 2014-10-08 NOTE — Discharge Instructions (Signed)

## 2014-10-08 NOTE — MAU Note (Signed)
PT  SAY UC FEEL LIKE  CRAMPS-  WORSE  AT 6PM.    HAS  HAD MUCUS  PLUG  WITH  BLOODY  SHOW.     CLINIC-  VE  ON Monday  1-2  CM  AND  STRIPPED  MEMBRANES.      DENIES HSV    AND MRSA.   GBS-  NEG

## 2014-10-12 ENCOUNTER — Encounter: Payer: Self-pay | Admitting: Obstetrics & Gynecology

## 2014-10-12 ENCOUNTER — Ambulatory Visit (INDEPENDENT_AMBULATORY_CARE_PROVIDER_SITE_OTHER): Payer: Medicaid Other | Admitting: Obstetrics & Gynecology

## 2014-10-12 VITALS — BP 118/73 | HR 92 | Wt 169.1 lb

## 2014-10-12 DIAGNOSIS — Z3483 Encounter for supervision of other normal pregnancy, third trimester: Secondary | ICD-10-CM

## 2014-10-12 DIAGNOSIS — O480 Post-term pregnancy: Secondary | ICD-10-CM

## 2014-10-12 NOTE — Progress Notes (Signed)
Pt reports decreased FM since yesterday.  

## 2014-10-12 NOTE — Progress Notes (Signed)
Subjective:  Monica Munoz is a 24 y.o. G2P1001 at [redacted]w[redacted]d being seen today for ongoing prenatal care.  Patient reports backache.  Contractions: Irregular.  Vag. Bleeding: None. Movement: (!) Decreased. Denies leaking of fluid.   The following portions of the patient's history were reviewed and updated as appropriate: allergies, current medications, past family history, past medical history, past social history, past surgical history and problem list.   Objective:   Filed Vitals:   10/12/14 1525  BP: 118/73  Pulse: 92  Weight: 169 lb 1.6 oz (76.703 kg)    Fetal Status:     Movement: (!) Decreased  Presentation: Vertex  General:  Alert, oriented and cooperative. Patient is in no acute distress.  Skin: Skin is warm and dry. No rash noted.   Cardiovascular: Normal heart rate noted  Respiratory: Normal respiratory effort, no problems with respiration noted  Abdomen: Soft, gravid, appropriate for gestational age. Pain/Pressure: Present     Pelvic: Vag. Bleeding: None     Cervical exam performed        Extremities: Normal range of motion.  Edema: Trace  Mental Status: Normal mood and affect. Normal behavior. Normal judgment and thought content.   Urinalysis:      Assessment and Plan:  Pregnancy: G2P1001 at [redacted]w[redacted]d  1. Post term pregnancy, antepartum condition or complication  - Amniotic fluid index with NST - IOL scheduled for this week  2. Supervision of normal pregnancy, antepartum, third trimester   Term labor symptoms and general obstetric precautions including but not limited to vaginal bleeding, contractions, leaking of fluid and fetal movement were reviewed in detail with the patient. Please refer to After Visit Summary for other counseling recommendations.  Return in about 6 weeks (around 11/23/2014) for PP visit.   Allie Bossier, MD

## 2014-10-13 ENCOUNTER — Inpatient Hospital Stay (HOSPITAL_COMMUNITY)
Admission: AD | Admit: 2014-10-13 | Discharge: 2014-10-14 | DRG: 775 | Disposition: A | Discharge status: Home / Self Care | Payer: Medicaid Other | Source: Ambulatory Visit | Attending: Family Medicine | Admitting: Family Medicine

## 2014-10-13 ENCOUNTER — Inpatient Hospital Stay (HOSPITAL_COMMUNITY): Payer: Medicaid Other | Admitting: Anesthesiology

## 2014-10-13 ENCOUNTER — Encounter (HOSPITAL_COMMUNITY): Payer: Self-pay

## 2014-10-13 DIAGNOSIS — Z3A40 40 weeks gestation of pregnancy: Secondary | ICD-10-CM | POA: Diagnosis present

## 2014-10-13 DIAGNOSIS — J45909 Unspecified asthma, uncomplicated: Secondary | ICD-10-CM | POA: Diagnosis present

## 2014-10-13 DIAGNOSIS — F329 Major depressive disorder, single episode, unspecified: Secondary | ICD-10-CM | POA: Diagnosis present

## 2014-10-13 DIAGNOSIS — O9902 Anemia complicating childbirth: Secondary | ICD-10-CM | POA: Diagnosis present

## 2014-10-13 DIAGNOSIS — O99344 Other mental disorders complicating childbirth: Secondary | ICD-10-CM | POA: Diagnosis present

## 2014-10-13 DIAGNOSIS — F418 Other specified anxiety disorders: Secondary | ICD-10-CM

## 2014-10-13 DIAGNOSIS — O99013 Anemia complicating pregnancy, third trimester: Secondary | ICD-10-CM

## 2014-10-13 DIAGNOSIS — O9952 Diseases of the respiratory system complicating childbirth: Secondary | ICD-10-CM | POA: Diagnosis present

## 2014-10-13 DIAGNOSIS — F419 Anxiety disorder, unspecified: Secondary | ICD-10-CM | POA: Diagnosis present

## 2014-10-13 DIAGNOSIS — Z3483 Encounter for supervision of other normal pregnancy, third trimester: Secondary | ICD-10-CM

## 2014-10-13 DIAGNOSIS — O480 Post-term pregnancy: Principal | ICD-10-CM | POA: Diagnosis present

## 2014-10-13 DIAGNOSIS — D649 Anemia, unspecified: Secondary | ICD-10-CM

## 2014-10-13 DIAGNOSIS — O4703 False labor before 37 completed weeks of gestation, third trimester: Secondary | ICD-10-CM

## 2014-10-13 LAB — TYPE AND SCREEN
ABO/RH(D): A POS
ANTIBODY SCREEN: NEGATIVE

## 2014-10-13 LAB — CBC
HEMATOCRIT: 33 % — AB (ref 36.0–46.0)
HEMOGLOBIN: 11.4 g/dL — AB (ref 12.0–15.0)
MCH: 32.5 pg (ref 26.0–34.0)
MCHC: 34.5 g/dL (ref 30.0–36.0)
MCV: 94 fL (ref 78.0–100.0)
Platelets: 228 10*3/uL (ref 150–400)
RBC: 3.51 MIL/uL — ABNORMAL LOW (ref 3.87–5.11)
RDW: 13.1 % (ref 11.5–15.5)
WBC: 17.2 10*3/uL — AB (ref 4.0–10.5)

## 2014-10-13 LAB — ABO/RH: ABO/RH(D): A POS

## 2014-10-13 LAB — RPR: RPR Ser Ql: NONREACTIVE

## 2014-10-13 MED ORDER — LANOLIN HYDROUS EX OINT: TOPICAL_OINTMENT | CUTANEOUS | Status: DC | PRN

## 2014-10-13 MED ORDER — ONDANSETRON HCL 4 MG/2ML IJ SOLN: 4.0000 mg | INTRAMUSCULAR | Status: DC | PRN

## 2014-10-13 MED ORDER — PHENYLEPHRINE 40 MCG/ML (10ML) SYRINGE FOR IV PUSH (FOR BLOOD PRESSURE SUPPORT): 80.0000 ug | PREFILLED_SYRINGE | INTRAVENOUS | Status: DC | PRN

## 2014-10-13 MED ORDER — IBUPROFEN 600 MG PO TABS
600.0000 mg | ORAL_TABLET | Freq: Four times a day (QID) | ORAL | Status: DC
  Administered 2014-10-13 – 2014-10-14 (×4): 600 mg via ORAL
  Filled 2014-10-13 (×4): qty 1

## 2014-10-13 MED ORDER — ACETAMINOPHEN 325 MG PO TABS: 650.0000 mg | ORAL_TABLET | ORAL | Status: DC | PRN

## 2014-10-13 MED ORDER — PHENYLEPHRINE 40 MCG/ML (10ML) SYRINGE FOR IV PUSH (FOR BLOOD PRESSURE SUPPORT)
PREFILLED_SYRINGE | INTRAVENOUS | Status: AC
  Filled 2014-10-13: qty 20

## 2014-10-13 MED ORDER — SIMETHICONE 80 MG PO CHEW: 80.0000 mg | CHEWABLE_TABLET | ORAL | Status: DC | PRN

## 2014-10-13 MED ORDER — DIBUCAINE 1 % RE OINT: 1.0000 "application " | TOPICAL_OINTMENT | RECTAL | Status: DC | PRN

## 2014-10-13 MED ORDER — BENZOCAINE-MENTHOL 20-0.5 % EX AERO
1.0000 "application " | INHALATION_SPRAY | CUTANEOUS | Status: DC | PRN
  Administered 2014-10-13: 1 via TOPICAL
  Filled 2014-10-13: qty 56

## 2014-10-13 MED ORDER — LIDOCAINE HCL (PF) 1 % IJ SOLN: 30.0000 mL | INTRAMUSCULAR | Status: DC | PRN

## 2014-10-13 MED ORDER — ONDANSETRON HCL 4 MG PO TABS
4.0000 mg | ORAL_TABLET | ORAL | Status: DC | PRN
  Administered 2014-10-13: 4 mg via ORAL
  Filled 2014-10-13: qty 1

## 2014-10-13 MED ORDER — ZOLPIDEM TARTRATE 5 MG PO TABS: 5.0000 mg | ORAL_TABLET | Freq: Every evening | ORAL | Status: DC | PRN

## 2014-10-13 MED ORDER — OXYTOCIN 40 UNITS IN LACTATED RINGERS INFUSION - SIMPLE MED
1.0000 m[IU]/min | INTRAVENOUS | Status: DC
  Administered 2014-10-13: 666 m[IU]/min via INTRAVENOUS
  Administered 2014-10-13: 2 m[IU]/min via INTRAVENOUS

## 2014-10-13 MED ORDER — FENTANYL CITRATE (PF) 100 MCG/2ML IJ SOLN
100.0000 ug | INTRAMUSCULAR | Status: DC | PRN
  Administered 2014-10-13: 100 ug via INTRAVENOUS
  Filled 2014-10-13: qty 2

## 2014-10-13 MED ORDER — OXYCODONE-ACETAMINOPHEN 5-325 MG PO TABS: 1.0000 | ORAL_TABLET | ORAL | Status: DC | PRN

## 2014-10-13 MED ORDER — FENTANYL 2.5 MCG/ML BUPIVACAINE 1/10 % EPIDURAL INFUSION (WH - ANES)
14.0000 mL/h | INTRAMUSCULAR | Status: DC | PRN
  Administered 2014-10-13 (×2): 14 mL/h via EPIDURAL
  Filled 2014-10-13: qty 125

## 2014-10-13 MED ORDER — OXYTOCIN 40 UNITS IN LACTATED RINGERS INFUSION - SIMPLE MED
62.5000 mL/h | INTRAVENOUS | Status: DC
  Filled 2014-10-13: qty 1000

## 2014-10-13 MED ORDER — OXYCODONE-ACETAMINOPHEN 5-325 MG PO TABS
1.0000 | ORAL_TABLET | ORAL | Status: DC | PRN
  Administered 2014-10-14: 1 via ORAL
  Filled 2014-10-13: qty 1

## 2014-10-13 MED ORDER — ONDANSETRON HCL 4 MG/2ML IJ SOLN
4.0000 mg | Freq: Four times a day (QID) | INTRAMUSCULAR | Status: DC | PRN
  Administered 2014-10-13: 4 mg via INTRAVENOUS
  Filled 2014-10-13: qty 2

## 2014-10-13 MED ORDER — LIDOCAINE-EPINEPHRINE (PF) 2 %-1:200000 IJ SOLN
INTRAMUSCULAR | Status: DC | PRN
  Administered 2014-10-13: 4 mL

## 2014-10-13 MED ORDER — EPHEDRINE 5 MG/ML INJ: 10.0000 mg | INTRAVENOUS | Status: DC | PRN

## 2014-10-13 MED ORDER — BUPIVACAINE HCL (PF) 0.25 % IJ SOLN
INTRAMUSCULAR | Status: DC | PRN
  Administered 2014-10-13 (×2): 4 mL via EPIDURAL

## 2014-10-13 MED ORDER — OXYCODONE-ACETAMINOPHEN 5-325 MG PO TABS: 2.0000 | ORAL_TABLET | ORAL | Status: DC | PRN

## 2014-10-13 MED ORDER — SENNOSIDES-DOCUSATE SODIUM 8.6-50 MG PO TABS
2.0000 | ORAL_TABLET | ORAL | Status: DC
  Administered 2014-10-14: 2 via ORAL
  Filled 2014-10-13: qty 2

## 2014-10-13 MED ORDER — LACTATED RINGERS IV SOLN: 500.0000 mL | INTRAVENOUS | Status: DC | PRN

## 2014-10-13 MED ORDER — WITCH HAZEL-GLYCERIN EX PADS: 1.0000 "application " | MEDICATED_PAD | CUTANEOUS | Status: DC | PRN

## 2014-10-13 MED ORDER — OXYTOCIN BOLUS FROM INFUSION: 500.0000 mL | INTRAVENOUS | Status: DC

## 2014-10-13 MED ORDER — TERBUTALINE SULFATE 1 MG/ML IJ SOLN: 0.2500 mg | Freq: Once | INTRAMUSCULAR | Status: DC | PRN

## 2014-10-13 MED ORDER — LACTATED RINGERS IV SOLN
INTRAVENOUS | Status: DC
  Administered 2014-10-13: 06:00:00 via INTRAVENOUS
  Administered 2014-10-13: 125 mL/h via INTRAVENOUS

## 2014-10-13 MED ORDER — CITRIC ACID-SODIUM CITRATE 334-500 MG/5ML PO SOLN: 30.0000 mL | ORAL | Status: DC | PRN

## 2014-10-13 MED ORDER — PRENATAL MULTIVITAMIN CH
1.0000 | ORAL_TABLET | Freq: Every day | ORAL | Status: DC
  Administered 2014-10-14: 1 via ORAL
  Filled 2014-10-13: qty 1

## 2014-10-13 NOTE — Progress Notes (Signed)
Labor Progress Note  S: Patient resting comfortably now that she has an epidural. Patient and mother asking about AROM. Discussed that it is not needed at this time but will consider it if fails to progress.  O:  BP 126/60 mmHg  Pulse 98  Temp(Src) 98.1 F (36.7 C) (Oral)  Resp 18  Ht  (1.626 m)  Wt 169 lb (76.658 kg)  BMI 28.99 kg/m2  SpO2 99%  LMP 01/05/2014 (Exact Date) EFM: FHR: 125, moderate variability, +accels, one variable decel noted ZOX:WRUEAVWU: 7.5 Effacement (%): 90 Cervical Position: Posterior Station: -1 Presentation: Vertex Exam by:: Herma Carson, RN   A&P: 24 y.o. G2P1001 [redacted]w[redacted]d SOL at term #Labor: Protracted labor. Start pitocin. Has epidural. If not making cervical change at next check, will consider AROM and IUPC placement #FWB: Category 2 tracing #GBS: Negative  Durenda Hurt, DO Resident Physician 11:09 AM

## 2014-10-13 NOTE — MAU Note (Signed)
Recheck pt per Dr Natale Milch

## 2014-10-13 NOTE — Anesthesia Procedure Notes (Signed)
Epidural Patient location during procedure: OB  Staffing Anesthesiologist: MOSER, CHRISTOPHER Performed by: anesthesiologist   Preanesthetic Checklist Completed: patient identified, surgical consent, pre-op evaluation, timeout performed, IV checked, risks and benefits discussed and monitors and equipment checked  Epidural Patient position: sitting Prep: DuraPrep Patient monitoring: heart rate, cardiac monitor, continuous pulse ox and blood pressure Approach: midline Location: L3-L4 Injection technique: LOR saline  Needle:  Needle type: Tuohy  Needle gauge: 17 G Needle length: 9 cm Needle insertion depth: 6 cm Catheter type: closed end flexible Catheter size: 19 Gauge Catheter at skin depth: 11 cm Test dose: negative and 2% lidocaine with Epi 1:200 K  Assessment Events: blood not aspirated, injection not painful, no injection resistance, negative IV test and no paresthesia  Additional Notes Reason for block:procedure for pain   

## 2014-10-13 NOTE — MAU Note (Signed)
Report called to [facility] on BS. Will go to 168

## 2014-10-13 NOTE — MAU Note (Signed)
Pt states contractions started at 0100-every 5 mins. Denies LOF or vag bleeding. +FM

## 2014-10-13 NOTE — Anesthesia Preprocedure Evaluation (Signed)
Anesthesia Evaluation  Patient identified by MRN, date of birth, ID band Patient awake    Reviewed: Allergy & Precautions, NPO status , Patient's Chart, lab work & pertinent test results  History of Anesthesia Complications Negative for: history of anesthetic complications  Airway Mallampati: II  TM Distance: >3 FB Neck ROM: Full    Dental  (+) Teeth Intact   Pulmonary asthma ,    breath sounds clear to auscultation       Cardiovascular negative cardio ROS   Rhythm:Regular     Neuro/Psych  Headaches, PSYCHIATRIC DISORDERS Anxiety Depression    GI/Hepatic negative GI ROS, Neg liver ROS,   Endo/Other  negative endocrine ROS  Renal/GU negative Renal ROS     Musculoskeletal   Abdominal   Peds  Hematology  (+) Blood dyscrasia, anemia ,   Anesthesia Other Findings   Reproductive/Obstetrics (+) Pregnancy                             Anesthesia Physical Anesthesia Plan  ASA: II  Anesthesia Plan: Epidural   Post-op Pain Management:    Induction:   Airway Management Planned:   Additional Equipment:   Intra-op Plan:   Post-operative Plan:   Informed Consent: I have reviewed the patients History and Physical, chart, labs and discussed the procedure including the risks, benefits and alternatives for the proposed anesthesia with the patient or authorized representative who has indicated his/her understanding and acceptance.     Plan Discussed with: Anesthesiologist  Anesthesia Plan Comments:         Anesthesia Quick Evaluation

## 2014-10-13 NOTE — MAU Note (Signed)
Pt chart changed to XXX after pt placed on fetal monitor. Printed paper chart from [phone] and placed with pt medical records.

## 2014-10-13 NOTE — H&P (Signed)
LABOR ADMISSION HISTORY AND PHYSICAL  Monica Munoz is a 24 y.o. female G2P1001 with IUP at [redacted]w[redacted]d by early [place] presenting for SOL. She reports +FM, + contractions, No LOF, no VB, no blurry vision, headaches or peripheral edema, and RUQ pain.  She plans on breast feeding. She request Nexplanon for birth control.  Dating: By early [place] --->  Estimated Date of Delivery: 10/08/14  Sono:    , CWD, normal anatomy, cephalic presentation, longitudinal lie, 297g, 53% EFW   Prenatal History/Complications:   Clinic [facility] Prenatal Labs  Dating 5 wk U/s + LMP Blood type:   A pos  Genetic Screen Quad:   WNL   Antibody: neg  Anatomic US WNL female Rubella:  immune  GTT Early:      59         Third trimester: 132 RPR:   NR  Flu vaccine 04/16/14, 09/16/14 HBsAg:   Neg  TDaP vaccine  07/07/14                                    HIV:   neg  GBS  Neg                                  GBS:   Contraception Nexplanon Pap:WNL  Baby [employer] breast   Circumcision girl   Pediatrician  Given list 08/12/14   Support Person  Zenon Mayo (mother)       Past Medical History: Past Medical History  Diagnosis Date  . Asthma   . Migraine with aura 04/25/2012  . Depression   . Anxiety     Past Surgical History: Past Surgical History  Procedure Laterality Date  . Wisdom tooth extraction      Obstetrical History: OB History    Gravida Para Term Preterm AB TAB SAB Ectopic Multiple Living   0 0 0 0 0 0 1      Social History: Social History   Social History  . Marital Status: Single    Spouse Name: N/A  . Number of Children: N/A  . Years of Education: N/A   Social History Main Topics  . Smoking status: Never Smoker   . Smokeless tobacco: Never Used  . Alcohol Use: No  . Drug Use: No  . Sexual Activity: Yes    Birth Control/ Protection: None   Other Topics Concern  . None   Social History Narrative    Family History: Family History  Problem Relation Age of Onset  . Heart murmur Mother    . Miscarriages / [place] Mother   . Diabetes Mother   . Autism Paternal Uncle   . Down syndrome Paternal Uncle   . Stroke Maternal Grandmother   . Diabetes Maternal Grandfather   . Hyperlipidemia Maternal Grandfather   . Stroke Maternal Grandfather     Allergies: Allergies  Allergen Reactions  . Benadryl [Diphenhydramine Hcl] Other (See Comments)    Red Man's and Sjogren's  . Lorazepam     Patient states that it makes her "loopy" and "off the wall"    Prescriptions prior to admission  Medication Sig Dispense Refill Last Dose  . Prenatal Vit-Fe Fumarate-FA (PRENATAL MULTIVITAMIN) TABS tablet Take 1 tablet by mouth daily at 12 noon.   10/12/2014 at Unknown time  . albuterol (PROVENTIL HFA;VENTOLIN HFA) 108 (90 BASE)  MCG/ACT inhaler Inhale 2 puffs into the lungs every 6 (six) hours as needed for wheezing or shortness of breath.    Taking  . Iron Combinations (IRON COMPLEX PO) Take 1 capsule by mouth daily.    Not Taking     Review of Systems   All systems reviewed and negative except as stated in HPI  BP 128/98 mmHg  Pulse 101  Temp(Src) 98 F (36.7 C) (Oral)  Resp 18  LMP 01/05/2014 (Exact Date) General appearance: alert, cooperative and moderate distress Lungs: non-labored breathing Heart: regular rate  Extremities: no sign of DVT, trace edema Presentation: cephalic Fetal monitoringBaseline: 115 bpm, Variability: Good {> 6 bpm) and Accelerations: Reactive Uterine activityDate/time of onset: 10/13/14 0100, Frequency: Every 4-5 minutes, Duration: 40-80 seconds and Intensity: mild Dilation: 3.5 Effacement (%): 90 Station: -3, -2 Exam by:: D Simpson RN   Prenatal Transfer Tool  Maternal Diabetes: No Genetic Screening: Normal Maternal Ultrasounds/Referrals: Normal Fetal Ultrasounds or other Referrals:  None Maternal Substance Abuse:  No Significant Maternal Medications:  Meds include: Other: albuterol Significant Maternal Lab Results: None  No results found  for this or any previous visit (from the past 24 hour(s)).  Patient Active Problem List   Diagnosis Date Noted  . Preterm uterine contractions in third trimester, antepartum 08/26/2014  . Anemia during pregnancy in third trimester 07/09/2014  . Supervision of normal pregnancy, antepartum 04/16/2014  . Anxiety state, unspecified 05/26/2013  . Asthma, mild intermittent 05/26/2013  . Depression 05/26/2013  . Headache, migraine 01/29/2012    Assessment: Antionette Munoz is a 24 y.o. G2P1001 at [redacted]w[redacted]d here for SOL.  #Labor:Expectant management #Pain: Epidural #FWB: Category 1 #ID:  GBS neg #MOF: Breast #MOC:Nexplanon  Tarri Abernethy, MD PGY-1 [facility]    OB FELLOW HISTORY AND PHYSICAL ATTESTATION  I have seen and examined this patient; I agree with above documentation in the resident's note. Hx of asthma noted.  Cherrie Gauze Wouk 10/13/2014, 7:39 AM

## 2014-10-14 ENCOUNTER — Inpatient Hospital Stay (HOSPITAL_COMMUNITY): Payer: Medicaid Other

## 2014-10-14 LAB — RAPID URINE DRUG SCREEN, HOSP PERFORMED
AMPHETAMINES: NOT DETECTED
BARBITURATES: NOT DETECTED
Benzodiazepines: NOT DETECTED
Cocaine: NOT DETECTED
Opiates: NOT DETECTED
TETRAHYDROCANNABINOL: NOT DETECTED

## 2014-10-14 MED ORDER — IBUPROFEN 600 MG PO TABS: 600.0000 mg | ORAL_TABLET | Freq: Four times a day (QID) | ORAL | Status: DC

## 2014-10-14 MED ORDER — PNEUMOCOCCAL VAC POLYVALENT 25 MCG/0.5ML IJ INJ
0.5000 mL | INJECTION | Freq: Once | INTRAMUSCULAR | Status: DC
  Filled 2014-10-14: qty 0.5

## 2014-10-14 NOTE — Progress Notes (Signed)
CLINICAL SOCIAL WORK MATERNAL/CHILD NOTE  Patient Details  Name: Monica Munoz MRN: [id number] Date of Birth: 10/13/2012  Date:  10/14/2014  Clinical Social Worker Initiating Note:  Sarah Venning, LCSW and Yazmin Rodriguez BSW, MSW intern   Date/ Time Initiated:  10/14/14/0900     Child's Name:  [name]    Legal Guardian:  Monica Munoz ([name])    Need for Interpreter:  None   Date of Referral:  10/13/14     Reason for Referral:  Behavioral Health Issues, including SI , Ethical Issues: History of anxiety and depression and current tense family dynamics.    Referral Source:  Central Nursery   Address:  [address] [place], [place] [address]   Phone number:  [phone]   Household Members:  Self, Minor Children, Parents   Natural Supports (not living in the home):  Friends, Immediate Family, Parent   Professional Supports: None   Employment: Unemployed   Type of Work:     Education:      Financial Resources:  Medicaid   Other Resources:  WIC   Cultural/Religious Considerations Which May Impact Care: None Reported   Strengths:  Ability to meet basic needs , Home prepared for child    Risk Factors/Current Problems:  Mental Health Concerns- Past diagnosis of PTSD, Bipolar disorder, anxiety, and depression.  Family/Relationship Issues: Per [name], FOB not involved since she was three months pregnant. [name] stated FOB lives in [place] and has stop all contact with him. [name] reports FOB sending harassing text and calling the police on her. [name] states she does not want FOB involved and she decided to be a security patient because she doesn't want him to call the hospital.    Cognitive State:  Linear Thinking , Alert , Able to Concentrate, Rigid thought process- [name] appears to become more agitated when her expectations are not met.   Mood/Affect:  Happy , Interested , Relaxed    CSW Assessment:  MSW intern and CSW presented in patient's room due to a consult being placed by  central nursery for a history of anxiety and depression. Per chart review, [name] was seen by a LCSW in 2015 were she reported a recent diagnosis of PTSD, Bipolar disorder, and depression. [name] was feeding infant when MSW intern and CSW entered the room but provided verbal consent for them to enagae. [name] presented with feelings of happiness as evidence by her bonding with her infant and smiling during part of the assessment.  [name] reported the birthing process going well and not having any concerns about her transition into recovery. [name] stated her infant was latching onto her breast and had no concerns about breastfeeding. [name] voiced this birthing process being easier then her previous one back in 2015. [name] reported having a one year old son. [name] stated she had moved to [place] when her son was about 6 months old but decided to move back in February because she missed her mother. [name] confirmed she is living with her mother, mother's boyfriend, and [name]'s two children. [name] reported having met all of the infant's basic needs and being prepared to bring the infant home.   MSW intern inquired about [name]'s mental history. Per [name], she has been coping with depression for a long period of time. [name] reported her father lost his legs when she was 10 due to a car accident, and her grandfather died in front of her at a young age. [name] stated those events impacted her life and are the main   causes of her depression and feelings of frustration. [name] reports feelings of sadness and anger when she thinks back on the traumatic events she has experienced in her life. [name] stated her father died and that it was hard for her to cope with the fact he is not around to meet his grandchildren. [name] voiced wishes of seeing her father bond with her children but knowing that was no longer possible. MSW intern asked [name] about her previous encounter with the LCSW in 2015 and the diagnosis discussed during that assessment. [name] stated not remembering that  assessment but voiced she did not feel those diagnosis were accurate anymore. [name] stated her mother took her to see a doctor during this pregnancy and was told that the bipolar diagnosis was not accurate. [name] identified depression as her accurate diagnosis for her because she constantly has feelings of sadness and anger and because of those feelings she tends to isolate herself. [name] denied being able to identify any triggers or duration for her depression. MSW intern asked [name] how she felt about her PTSD diagnosis. [name] stated she felt that diagnosis could be in relation to the traumatic events she experienced with her father and grandfather. [name] reported going to therapy about a year ago but did not find it helpful and voiced feelings of frustration towards her therapist. [name] stated she did not like talking about her feelings and then being told to find ways to cope and change her feelings to positive ones.  [name] stated it being difficult to change how she feels and being able to find coping skills to deal with her anger and depression. [name] reported taking medications before but not liking any of them because of the side effects. [name] stated some medications made her more impulsive and others made her irritable. [name] voiced regularly having nightmares of someone wanting to kill her or her family members. [name] denied ever feeling threatened in her life for her to have those dreams. [name] did not voice any concerns about the nightmares.[name] was able to identify her son as a positive impact in her life and a distraction for her. [name] displayed a smile on her face when she talked about her son and the things he loves to do and she enjoys to do with him. MSW intern inquired about some of the fun things [name] does with her son. [name] stated going to [employer], the park, and for a walk as things they enjoy doing together. [name] voiced how that helps her distract her mind and makes her happy to see her son happy.   [name] denied any  PPD during her last pregnancy. MSW intern provided education on perinatal mood disorders and information about the hospital's support group, "Feelings After Birth." [name] was appreciative of the information and agreed to contact her OB if needs arise but did not voice any concerns.   CSW inquired about FOB and their family dynamics. [name]'s affect changed when FOB was brought up and displayed feelings of irritability as evidence by her tone of voice and brief responses.  [name] reported separating from him during her third month of her pregnancy and moving back to [place] in February. Per [name], FOB resides in [place]. [name] voiced FOB being overbearing and controlling; this brought her constant stress and frustration, which led her to leaving him. Per [name], FOB still harasses her and her mother through text and has called the police on her. [name] states she has no interest in rekindling the   relationship or having him involved in the infant's life. [name] stated it being best to keep FOB out of the family dynamics.   MSW intern asked [name] if the situation with FOB lead to her requesting to be a security patient. [name] confirmed she requested to be a security patient because she did not want FOB to know she was in the hospital and contact her. CSW asked about her safety and if she felt in danger, along with inquiring about filing a 50B. [name] denied any feelings of fear towards FOB and did not voice any interest in filing an order against him. [name] stated FOB knew not to threaten her because she would hurt him before he even attempted to hurt her.   [name] voiced not having a pediatrician chosen yet and just waiting for a nurse to bring the list. CSW asked [name] what pediatrician her son was going to currently. [name] reported her son went to [facility] but was not happy with them and he has not been back in a month and voiced a desire to change his pediatrician as well. [name] stated she did not like how they disagreed with her when she  would take him for his appointments. [name] expressed hopes of wanting to be an early discharge today. CSW informed [name] about the possibility of the infant not being discharged and having to stay one more night. [name] displayed feelings of frustration as evidence by her silent pause and facial expressions. [name] denied having any further questions or concerns but agreed to contact CSW if needs arise.   CSW Plan/Description:   Patient/Family Education- MSW intern provided education on perinatal mood disorders. No Further Intervention Required/No Barriers to Discharge Information/Referral to Community Resources- CSW to provide [name] with a list of therapists in her area.      Yazmin Rodriguez, Student-SW 10/14/2014, 10:01 AM  

## 2014-10-14 NOTE — Lactation Note (Signed)
This note was copied from the chart of Monica Moriyah Byington. Lactation Consultation Note  Baby was having difficulty grasping and sucking on the bottle.  She sucked on a gloved finger, it was replaced by the bottle and after a few attempts she was able to grasp and transfer.  Baby was then handed to mom and she took the bottle from her as well.  Communicated to her that one more bottle feeding should be observed.  While I was in the room maternal grandmother came in she question what was happening and when she was informed of the challenge she became defensive and was adamant that the baby took the bottle from her last night.  It was calmly explained to her that today was a new day and that we needed to be sure the baby was eating.  She repeated firmly her statement and started to demand information about discharge.  Mother began to answer her questions and lactation consultant left the room. Social worker notified. Patient Name: Monica Munoz [id number] Date: 10/14/2014 Reason for consult: Initial assessment   Maternal Data Does the patient have breastfeeding experience prior to this delivery?: Yes  Feeding Feeding Type: Formula Length of feed: 30 min  LATCH Score/Interventions Latch: Repeated attempts needed to sustain latch, nipple held in mouth throughout feeding, stimulation needed to elicit sucking reflex. Intervention(s): Assist with latch;Adjust position  Audible Swallowing: A few with stimulation  Type of Nipple: Everted at rest and after stimulation  Comfort (Breast/Nipple): Soft / non-tender     Hold (Positioning): No assistance needed to correctly position infant at breast.  LATCH Score: 8  Lactation Tools Discussed/Used     Consult Status Consult Status: Complete    Soyla Dryer 10/14/2014, 11:47 AM

## 2014-10-14 NOTE — Discharge Summary (Signed)
OB Discharge Summary     Patient Name: Monica Munoz DOB: 1990/09/24 MRN: [id number]  Date of admission: 10/13/2014 Delivering MD: Durenda Hurt   Date of discharge: 10/14/2014  Admitting diagnosis: 41 WEEKS CTX Intrauterine pregnancy: [redacted]w[redacted]d         Discharge diagnosis: Term Pregnancy Delivered                                                                                                Augmentation: Pitocin  Complications: None  Hospital course:  Onset of Labor With Vaginal Delivery     24 y.o. yo [id number] at [redacted]w[redacted]d was admitted in Latent Laboron 10/13/2014. Patient had an uncomplicated labor course as follows:  Membrane Rupture Time/Date: 11:29 AM ,10/13/2014   Intrapartum Procedures: Episiotomy: None [1]                                         Lacerations:  2nd degree [3];Vaginal [6]  Mediations and procedures used include: Pitocin  Patient had a delivery of a Viable infant. 10/13/2014  Information for the patient's newborn:  Nanette, Wirsing [[id number]]  Delivery Method: Vaginal, Spontaneous Delivery (Filed from Delivery Summary)    Pateint had an uncomplicated postpartum course.  She is ambulating, tolerating a regular diet, passing flatus, and urinating well. Patient is discharged home in stable condition on No discharge date for patient encounter.Marland Kitchen    Physical exam  Filed Vitals:   10/13/14 1610 10/13/14 2020 10/14/14 0243 10/14/14 0543  BP: 110/55 125/78 118/71 120/60  Pulse: 70 64 69 73  Temp: 98 F (36.7 C) 98.6 F (37 C) 98.6 F (37 C) 98.2 F (36.8 C)  TempSrc: Oral Oral Oral Oral  Resp: Height:      Weight:      SpO2:       General: alert, cooperative and no distress Lochia: appropriate Uterine Fundus: firm Incision: N/A DVT Evaluation: No evidence of DVT seen on physical exam. No significant calf/ankle edema. Labs: Lab Results  Component Value Date   WBC 17.2* 10/13/2014   HGB 11.4* 10/13/2014   HCT 33.0* 10/13/2014   MCV  94.0 10/13/2014   PLT 228 10/13/2014   CMP Latest Ref Rng 04/25/2012  Glucose 65 - 99 mg/dL 72  BUN 6 - 20 mg/dL 9  Creatinine 3.24 - 4.01 mg/dL 0.27  Sodium 253 - 664 mmol/L 140  Potassium 3.5 - 5.2 mmol/L 4.1  Chloride 97 - 108 mmol/L 102  CO2 19 - 28 mmol/L 23  Calcium 8.7 - 10.2 mg/dL 9.9  Total Protein 6.0 - 8.5 g/dL 7.1  Albumin 3.5 - 5.5 g/dL 4.3  Total Bilirubin 0.0 - 1.2 mg/dL 0.4  Alkaline Phos 39 - 117 IU/L 92  AST 0 - 40 IU/L 16  ALT 0 - 32 IU/L 13    Discharge instruction: per After Visit Summary and "Baby and Me Booklet".  Medications:  Current facility-administered medications:  .  acetaminophen (TYLENOL) tablet 650 mg, 650  mg, Oral, Q4H PRN, Durenda Hurt, MD .  benzocaine-Menthol (DERMOPLAST) 20-0.5 % topical spray 1 application, 1 application, Topical, PRN, Durenda Hurt, MD, 1 application at 10/13/14 1511 .  witch hazel-glycerin (TUCKS) pad 1 application, 1 application, Topical, PRN **AND** dibucaine (NUPERCAINAL) 1 % rectal ointment 1 application, 1 application, Rectal, PRN, Durenda Hurt, MD .  ibuprofen (ADVIL,MOTRIN) tablet 600 mg, 600 mg, Oral, 4 times per day, Durenda Hurt, MD, 600 mg at 10/14/14 0551 .  lanolin ointment, , Topical, PRN, Durenda Hurt, MD .  ondansetron [facility]) tablet 4 mg, 4 mg, Oral, Q4H PRN, 4 mg at 10/13/14 1511 **OR** ondansetron (ZOFRAN) injection 4 mg, 4 mg, Intravenous, Q4H PRN, Durenda Hurt, MD .  oxyCODONE-acetaminophen (PERCOCET/ROXICET) 5-325 MG per tablet 1 tablet, 1 tablet, Oral, Q4H PRN, Durenda Hurt, MD .  pneumococcal 23 valent vaccine (PNU-IMMUNE) injection 0.5 mL, 0.5 mL, Intramuscular, Once, Levie Heritage, DO .  prenatal multivitamin tablet 1 tablet, 1 tablet, Oral, Q1200, Durenda Hurt, MD .  senna-docusate (Senokot-S) tablet 2 tablet, 2 tablet, Oral, Q24H, Durenda Hurt, MD, 2 tablet at 10/14/14 0005 .  simethicone (MYLICON) chewable tablet 80 mg, 80 mg, Oral, PRN, Durenda Hurt, MD .   zolpidem (AMBIEN) tablet 5 mg, 5 mg, Oral, QHS PRN, Durenda Hurt, MD  Facility-Administered Medications Ordered in Other Encounters:  .  bupivacaine (PF) (MARCAINE) 0.25 % injection, , , Anesthesia Intra-op, Val Eagle, MD, 4 mL at 10/13/14 1610 .  lidocaine-EPINEPHrine (XYLOCAINE W/EPI) 2 %-1:200000 (PF) injection, , Other, Anesthesia Intra-op, Val Eagle, MD, 4 mL at 10/13/14 0607  Diet: routine diet  Activity: Advance as tolerated. Pelvic rest for 6 weeks.   Outpatient follow up:6 weeks  Postpartum contraception: Nexplanon  Newborn Data: Live born female  Birth Weight: 8 lb 3 oz (3714 g) APGAR: 9, 9  Baby Feeding: Breast Disposition:home with mother   10/14/2014 Tarri Abernethy, MD  OB fellow attestation I have seen and examined this patient and agree with above documentation in the resident's note.   Monica Munoz is a 24 y.o. G2P2002 s/p NSVD.   Pain is well controlled.  Plan for birth control is Nexplanon.  Method of Feeding: Breast  PE:  BP 120/60 mmHg  Pulse 73  Temp(Src) 98.2 F (36.8 C) (Oral)  Resp 20  Ht  (1.626 m)  Wt 169 lb (76.658 kg)  BMI 28.99 kg/m2  SpO2 99%  LMP 01/05/2014 (Exact Date)  Breastfeeding? Unknown Gen: well appearing Heart: reg rate Lungs: normal WOB Fundus firm Ext: soft, no pain, no edema  Plan: discharge today - postpartum care discussed - f/u clinic in 6 weeks for postpartum visit Federico Flake, MD 8:14 AM

## 2014-10-14 NOTE — Lactation Note (Signed)
This note was copied from the chart of Monica Munoz. Lactation Consultation Note  Mom briefly BF her 33 month old (less than 24 hours).  This baby has been at the breast often but continues rooting.  Mom has colostrum that is easily hand expressible.  A small amount was spoon fed to baby.  Repositioned on the breast and mom encouraged to use breast compressions to keep baby interested.  She is tenetive about this and requested formula.  Request granted.  Explained to mom to always BF first before offering formula.  Also discussed with her that she could express her breast milk and feed it to baby in a bottle if desired.Aware of support groups and outpatient services.  Follow-up PRN.  Patient Name: Monica Aracelie Addis [id number] Date: 10/14/2014     Maternal Data Does the patient have breastfeeding experience prior to this delivery?: Yes  Feeding Feeding Type: Breast Fed Length of feed: 60 min  LATCH Score/Interventions Latch: Repeated attempts needed to sustain latch, nipple held in mouth throughout feeding, stimulation needed to elicit sucking reflex. Intervention(s): Assist with latch;Adjust position  Audible Swallowing: A few with stimulation  Type of Nipple: Everted at rest and after stimulation  Comfort (Breast/Nipple): Soft / non-tender     Hold (Positioning): No assistance needed to correctly position infant at breast.  LATCH Score: 8  Lactation Tools Discussed/Used     Consult Status      Soyla Dryer 10/14/2014, 10:46 AM

## 2014-10-14 NOTE — Discharge Instructions (Signed)

## 2014-10-14 NOTE — Anesthesia Postprocedure Evaluation (Signed)
Anesthesia Post Note  Patient: Monica Munoz  Procedure(s) Performed: * No procedures listed *  Anesthesia type: Epidural  Patient location: Mother/Baby  Post pain: Pain level controlled  Post assessment: Post-op Vital signs reviewed  Last Vitals:  Filed Vitals:   10/14/14 0543  BP: 120/60  Pulse: 73  Temp: 36.8 C  Resp: 20    Post vital signs: Reviewed  Level of consciousness: awake  Complications: No apparent anesthesia complications

## 2014-11-23 ENCOUNTER — Ambulatory Visit: Payer: Medicaid Other | Admitting: Obstetrics & Gynecology

## 2015-01-06 ENCOUNTER — Ambulatory Visit: Payer: Medicaid Other | Admitting: Advanced Practice Midwife

## 2015-02-10 ENCOUNTER — Ambulatory Visit: Payer: Medicaid Other | Admitting: Obstetrics & Gynecology

## 2015-02-19 ENCOUNTER — Encounter (HOSPITAL_COMMUNITY): Payer: Self-pay | Admitting: Family Medicine

## 2015-02-19 ENCOUNTER — Inpatient Hospital Stay (HOSPITAL_COMMUNITY)
Admission: EM | Admit: 2015-02-19 | Discharge: 2015-02-23 | DRG: 872 | Disposition: A | Discharge status: Home / Self Care | Payer: Medicaid Other | Attending: Family Medicine | Admitting: Family Medicine

## 2015-02-19 DIAGNOSIS — G43809 Other migraine, not intractable, without status migrainosus: Secondary | ICD-10-CM | POA: Insufficient documentation

## 2015-02-19 DIAGNOSIS — N61 Mastitis without abscess: Secondary | ICD-10-CM | POA: Diagnosis not present

## 2015-02-19 DIAGNOSIS — F329 Major depressive disorder, single episode, unspecified: Secondary | ICD-10-CM | POA: Diagnosis present

## 2015-02-19 DIAGNOSIS — A419 Sepsis, unspecified organism: Principal | ICD-10-CM | POA: Diagnosis present

## 2015-02-19 DIAGNOSIS — D72829 Elevated white blood cell count, unspecified: Secondary | ICD-10-CM | POA: Diagnosis present

## 2015-02-19 DIAGNOSIS — K5900 Constipation, unspecified: Secondary | ICD-10-CM | POA: Diagnosis present

## 2015-02-19 DIAGNOSIS — F419 Anxiety disorder, unspecified: Secondary | ICD-10-CM | POA: Diagnosis present

## 2015-02-19 DIAGNOSIS — G43709 Chronic migraine without aura, not intractable, without status migrainosus: Secondary | ICD-10-CM | POA: Diagnosis not present

## 2015-02-19 DIAGNOSIS — G43909 Migraine, unspecified, not intractable, without status migrainosus: Secondary | ICD-10-CM | POA: Diagnosis present

## 2015-02-19 DIAGNOSIS — J45909 Unspecified asthma, uncomplicated: Secondary | ICD-10-CM | POA: Diagnosis present

## 2015-02-19 DIAGNOSIS — Z79899 Other long term (current) drug therapy: Secondary | ICD-10-CM

## 2015-02-19 DIAGNOSIS — N63 Unspecified lump in breast: Secondary | ICD-10-CM | POA: Diagnosis not present

## 2015-02-19 LAB — BASIC METABOLIC PANEL
ANION GAP: 11 (ref 5–15)
BUN: 8 mg/dL (ref 6–20)
CALCIUM: 9 mg/dL (ref 8.9–10.3)
CO2: 24 mmol/L (ref 22–32)
CREATININE: 0.77 mg/dL (ref 0.44–1.00)
Chloride: 105 mmol/L (ref 101–111)
GFR calc Af Amer: >60 mL/min (ref >60)
GLUCOSE: 117 mg/dL — AB (ref 65–99)
Potassium: 4.8 mmol/L (ref 3.5–5.1)
Sodium: 140 mmol/L (ref 135–145)

## 2015-02-19 LAB — CBC
HCT: 42.4 % (ref 36.0–46.0)
HEMOGLOBIN: 14.1 g/dL (ref 12.0–15.0)
MCH: 31.5 pg (ref 26.0–34.0)
MCHC: 33.3 g/dL (ref 30.0–36.0)
MCV: 94.9 fL (ref 78.0–100.0)
PLATELETS: 238 10*3/uL (ref 150–400)
RBC: 4.47 MIL/uL (ref 3.87–5.11)
RDW: 12.3 % (ref 11.5–15.5)
WBC: 20 10*3/uL — ABNORMAL HIGH (ref 4.0–10.5)

## 2015-02-19 LAB — URINALYSIS, ROUTINE W REFLEX MICROSCOPIC
BILIRUBIN URINE: NEGATIVE
Glucose, UA: NEGATIVE mg/dL
HGB URINE DIPSTICK: NEGATIVE
KETONES UR: NEGATIVE mg/dL
NITRITE: NEGATIVE
PROTEIN: NEGATIVE mg/dL
SPECIFIC GRAVITY, URINE: 1.012 (ref 1.005–1.030)
pH: 7 (ref 5.0–8.0)

## 2015-02-19 LAB — I-STAT CG4 LACTIC ACID, ED: LACTIC ACID, VENOUS: 1.07 mmol/L (ref 0.5–2.0)

## 2015-02-19 LAB — URINE MICROSCOPIC-ADD ON: RBC / HPF: NONE SEEN RBC/hpf (ref 0–5)

## 2015-02-19 LAB — LACTIC ACID, PLASMA
Lactic Acid, Venous: 1.2 mmol/L (ref 0.5–2.0)
Lactic Acid, Venous: 1.1 mmol/L (ref 0.5–2.0)

## 2015-02-19 LAB — I-STAT BETA HCG BLOOD, ED (MC, WL, AP ONLY): I-stat hCG, quantitative: <5 m[IU]/mL (ref <5)

## 2015-02-19 LAB — CBG MONITORING, ED: GLUCOSE-CAPILLARY: 80 mg/dL (ref 65–99)

## 2015-02-19 MED ORDER — SODIUM CHLORIDE 0.9 % IV BOLUS (SEPSIS)
1000.0000 mL | Freq: Once | INTRAVENOUS | Status: AC
  Administered 2015-02-19: 1000 mL via INTRAVENOUS

## 2015-02-19 MED ORDER — ACETAMINOPHEN 650 MG RE SUPP: 650.0000 mg | Freq: Four times a day (QID) | RECTAL | Status: DC | PRN

## 2015-02-19 MED ORDER — OXYCODONE HCL 5 MG PO TABS
5.0000 mg | ORAL_TABLET | ORAL | Status: DC | PRN
  Administered 2015-02-19 – 2015-02-21 (×6): 5 mg via ORAL
  Filled 2015-02-19 (×6): qty 1

## 2015-02-19 MED ORDER — SODIUM CHLORIDE 0.9 % IV BOLUS (SEPSIS): 1000.0000 mL | Freq: Once | INTRAVENOUS | Status: DC

## 2015-02-19 MED ORDER — ENOXAPARIN SODIUM 40 MG/0.4ML ~~LOC~~ SOLN
40.0000 mg | SUBCUTANEOUS | Status: DC
  Administered 2015-02-20 – 2015-02-23 (×4): 40 mg via SUBCUTANEOUS
  Filled 2015-02-19 (×4): qty 0.4

## 2015-02-19 MED ORDER — ALUM & MAG HYDROXIDE-SIMETH 200-200-20 MG/5ML PO SUSP: 30.0000 mL | Freq: Four times a day (QID) | ORAL | Status: DC | PRN

## 2015-02-19 MED ORDER — KETOROLAC TROMETHAMINE 30 MG/ML IJ SOLN
30.0000 mg | Freq: Once | INTRAMUSCULAR | Status: AC
  Administered 2015-02-19: 30 mg via INTRAVENOUS
  Filled 2015-02-19: qty 1

## 2015-02-19 MED ORDER — SODIUM CHLORIDE 0.9 % IV SOLN
1250.0000 mg | Freq: Two times a day (BID) | INTRAVENOUS | Status: DC
  Administered 2015-02-20: 1250 mg via INTRAVENOUS
  Filled 2015-02-19 (×2): qty 1250

## 2015-02-19 MED ORDER — METHYLPREDNISOLONE SODIUM SUCC 125 MG IJ SOLR
125.0000 mg | Freq: Once | INTRAMUSCULAR | Status: AC
  Administered 2015-02-19: 125 mg via INTRAVENOUS
  Filled 2015-02-19: qty 2

## 2015-02-19 MED ORDER — ONDANSETRON HCL 4 MG/2ML IJ SOLN
4.0000 mg | Freq: Four times a day (QID) | INTRAMUSCULAR | Status: DC | PRN
  Administered 2015-02-20 – 2015-02-21 (×2): 4 mg via INTRAVENOUS
  Filled 2015-02-19 (×2): qty 2

## 2015-02-19 MED ORDER — ONDANSETRON HCL 4 MG PO TABS: 4.0000 mg | ORAL_TABLET | Freq: Four times a day (QID) | ORAL | Status: DC | PRN

## 2015-02-19 MED ORDER — SODIUM CHLORIDE 0.9 % IV SOLN
INTRAVENOUS | Status: DC
  Administered 2015-02-19 – 2015-02-23 (×7): via INTRAVENOUS

## 2015-02-19 MED ORDER — VANCOMYCIN HCL IN DEXTROSE 1-5 GM/200ML-% IV SOLN
1000.0000 mg | Freq: Once | INTRAVENOUS | Status: AC
  Administered 2015-02-19: 1000 mg via INTRAVENOUS
  Filled 2015-02-19: qty 200

## 2015-02-19 MED ORDER — ACETAMINOPHEN 325 MG PO TABS
650.0000 mg | ORAL_TABLET | Freq: Four times a day (QID) | ORAL | Status: DC | PRN
Start: 2015-02-19 — End: 2015-02-23

## 2015-02-19 MED ORDER — HYDROMORPHONE HCL 1 MG/ML IJ SOLN
0.5000 mg | INTRAMUSCULAR | Status: DC | PRN
  Administered 2015-02-20: 1 mg via INTRAVENOUS
  Administered 2015-02-20: 0.5 mg via INTRAVENOUS
  Administered 2015-02-21 – 2015-02-22 (×8): 1 mg via INTRAVENOUS
  Filled 2015-02-19 (×10): qty 1

## 2015-02-19 NOTE — Progress Notes (Signed)
Pharmacy Antibiotic Note  Monica Munoz is a 25 y.o. female admitted on 02/19/2015 with cellulitis.  Pharmacy has been consulted for vancomcyin dosing. Pt received vancomycin 1g IV once in the ED.  Plan: Vancomycin  IV every 12 hours.  Goal trough 10-15 mcg/mL.  Monitor culture data, renal function and clinical course VT at SS     Temp (24hrs), Avg:99.6 F (37.6 C), Min:98.9 F (37.2 C), Max:100.2 F (37.9 C)   Recent Labs Lab 02/19/15 1752 02/19/15 2136  WBC 20.0*  --   CREATININE 0.77  --   LATICACIDVEN  --  1.07    CrCl cannot be calculated (Unknown ideal weight.).    Allergies  Allergen Reactions  . Benadryl [Diphenhydramine Hcl] Other (See Comments)    Red Man's and Sjogren's  . Diphenhydramine Hcl     Other reaction(s): Redness  . Lorazepam     Patient states that it makes her "loopy" and "off the wall"  . Adhesive [Tape] Dermatitis    hypofix tape only - itching    Antimicrobials this admission: Vanc 2/3 >>    >>   Dose adjustments this admission:   Microbiology results:  BCx:   UCx:    Sputum:    MRSA PCR:    Arlean Hopping. Newman Pies, PharmD, BCPS Clinical Pharmacist Pager [phone] Marquita Palms 02/19/2015 10:07 PM

## 2015-02-19 NOTE — ED Provider Notes (Signed)
CSN: [id number]     Arrival date & time 02/19/15  1717 History   First MD Initiated Contact with Patient 02/19/15 1956     Chief Complaint  Patient presents with  . Migraine  . Breast Pain     (Consider location/radiation/quality/duration/timing/severity/associated sxs/prior Treatment) HPI Comments: This is a 25 year old female who states that for the last couple days she just has not felt well she's had chills but no documented fever.  She's had pain in her right breast.  She reports that she had her nipples, repeat her's approximately 3 weeks ago after she stopped nursing her child.  She also reports a frontal headache which is atypical for her when she gets migraine headaches.  They're usually bandlike.  She states she has not noticed any drainage from her nipple but it is extremely tender.  Most of the soreness of the breast is medial.  Approximately the 7 to 8:00 location, without erythema  Patient is a 25 y.o. female presenting with migraines. The history is provided by the patient.  Migraine This is a new problem. The current episode started yesterday. The problem occurs constantly. The problem has been gradually worsening. Associated symptoms include chills, headaches and myalgias. Pertinent negatives include no abdominal pain, congestion, coughing, fever, nausea, vomiting or weakness. Nothing aggravates the symptoms. She has tried nothing for the symptoms. The treatment provided no relief.    Past Medical History  Diagnosis Date  . Asthma   . Migraine with aura 04/25/2012  . Depression   . Anxiety    Past Surgical History  Procedure Laterality Date  . Wisdom tooth extraction     Family History  Problem Relation Age of Onset  . Heart murmur Mother   . Miscarriages / [place] Mother   . Diabetes Mother   . Autism Paternal Uncle   . Down syndrome Paternal Uncle   . Stroke Maternal Grandmother   . Diabetes Maternal Grandfather   . Hyperlipidemia Maternal Grandfather   .  Stroke Maternal Grandfather    Social History  Substance Use Topics  . Smoking status: Never Smoker   . Smokeless tobacco: Never Used  . Alcohol Use: No   OB History    Gravida Para Term Preterm AB TAB SAB Ectopic Multiple Living   0 0 0 0 0 0 2     Review of Systems  Constitutional: Positive for chills. Negative for fever.  HENT: Negative for congestion.   Respiratory: Negative for cough.   Cardiovascular:       Pain R breast  Gastrointestinal: Negative for nausea, vomiting and abdominal pain.  Musculoskeletal: Positive for myalgias.  Neurological: Positive for headaches. Negative for dizziness and weakness.  All other systems reviewed and are negative.     Allergies  Benadryl; Diphenhydramine hcl; Lorazepam; Vancomycin; and Adhesive  Home Medications   Prior to Admission medications   Medication Sig Start Date End Date Taking? Authorizing Provider  acetaminophen (TYLENOL) 500 MG tablet Take 1,000 mg by mouth daily as needed for mild pain or headache.   Yes Historical Provider, MD  butalbital-acetaminophen-caffeine (FIORICET WITH CODEINE) 50-325-40-30 MG capsule Take 1 capsule by mouth every 4 (four) hours as needed for headache or migraine.   Yes Historical Provider, MD  albuterol (PROVENTIL HFA;VENTOLIN HFA) 108 (90 BASE) MCG/ACT inhaler Inhale 2 puffs into the lungs every 6 (six) hours as needed for wheezing or shortness of breath.     Historical Provider, MD  ibuprofen (ADVIL,MOTRIN) 600 MG tablet Take  1 tablet (600 mg total) by mouth every 6 (six) hours. Patient not taking: Reported on 02/19/2015 10/14/14   Marquette Saa, MD   BP 124/74 mmHg  Pulse 72  Temp(Src) 98.2 F (36.8 C) (Oral)  Resp 16  Ht  (1.651 m)  Wt 76.658 kg  BMI 28.12 kg/m2  SpO2 94%  LMP 02/06/2015 Physical Exam  Constitutional: She is oriented to person, place, and time. She appears well-developed and well-nourished.  HENT:  Head: Normocephalic.  Eyes: Pupils are equal,  round, and reactive to light.  Neck: Normal range of motion.  Cardiovascular: Regular rhythm.  Tachycardia present.   Pulmonary/Chest: Effort normal and breath sounds normal.    Abdominal: Soft.  Musculoskeletal: Normal range of motion.  Neurological: She is alert and oriented to person, place, and time.  Skin: Skin is warm and dry.  Nursing note and vitals reviewed.   ED Course  Procedures (including critical care time) Labs Review Labs Reviewed  BASIC METABOLIC PANEL - Abnormal; Notable for the following:    Glucose, Bld 117 (*)    All other components within normal limits  CBC - Abnormal; Notable for the following:    WBC 20.0 (*)    All other components within normal limits  URINALYSIS, ROUTINE W REFLEX MICROSCOPIC (NOT AT [facility]) - Abnormal; Notable for the following:    Leukocytes, UA TRACE (*)    All other components within normal limits  URINE MICROSCOPIC-ADD ON - Abnormal; Notable for the following:    Squamous Epithelial / LPF 0-5 (*)    Bacteria, UA RARE (*)    All other components within normal limits  BASIC METABOLIC PANEL - Abnormal; Notable for the following:    CO2 21 (*)    Glucose, Bld 173 (*)    Calcium 8.7 (*)    All other components within normal limits  CBC - Abnormal; Notable for the following:    WBC 23.1 (*)    All other components within normal limits  CBC WITH DIFFERENTIAL/PLATELET - Abnormal; Notable for the following:    WBC 20.5 (*)    RBC 3.47 (*)    Hemoglobin 10.9 (*)    HCT 32.8 (*)    Neutro Abs 16.2 (*)    All other components within normal limits  BASIC METABOLIC PANEL - Abnormal; Notable for the following:    Glucose, Bld 110 (*)    BUN <5 (*)    Calcium 7.9 (*)    All other components within normal limits  CBC WITH DIFFERENTIAL/PLATELET - Abnormal; Notable for the following:    RBC 3.60 (*)    Hemoglobin 11.4 (*)    HCT 34.3 (*)    All other components within normal limits  CULTURE, BLOOD (ROUTINE X 2)  CULTURE, BLOOD  (ROUTINE X 2)  LACTIC ACID, PLASMA  LACTIC ACID, PLASMA  CBG MONITORING, ED  I-STAT BETA HCG BLOOD, ED ([facility], [facility], [facility] ONLY)  I-STAT CG4 LACTIC ACID, ED  I-STAT CG4 LACTIC ACID, ED    Imaging Review No results found. I have personally reviewed and evaluated these images and lab results as part of my medical decision-making.   EKG Interpretation None      MDM   Final diagnoses:  Acute mastitis of right breast  Sepsis, due to unspecified organism (HCC)        Earley Favor, NP 02/22/15 2156  Lyndal Pulley, MD 02/25/15 [phone]

## 2015-02-19 NOTE — ED Notes (Signed)
Right breast with induration, redness and purulent drainage.

## 2015-02-19 NOTE — ED Notes (Signed)
Dr. Lovell Sheehan notified of itching after vancomycin-pt completed most of dose before itching began-denies throat itching or swelling. Given 125 solumedrol per verbal order. RN on 6N notified and plan of care explained.

## 2015-02-19 NOTE — ED Provider Notes (Signed)
Medical screening examination/treatment/procedure(s) were conducted as a shared visit with non-physician practitioner(s) and myself.  I personally evaluated the patient during the encounter.   EKG Interpretation None     Emergency Focused Ultrasound Exam Limited Ultrasound of Soft Tissue   Performed and interpreted by Dr. Clydene Pugh Indication: evaluation for infection or foreign body Transverse and Sagittal views of right breast are obtained in real time for the purposes of evaluation of skin and underlying soft tissues.  Findings: no heterogeneous fluid collection, + hyperemia/edema of surrounding tissue Interpretation: no abscess, + cellulitis Images archived electronically.  CPT Codes:   Breast [id number]  Other soft tissue [phone]    25 y.o. female presents with purulent drainage from right nipple and induration, redness of right breast soft tissue. Had nipple re-pierced recently. No large collection identified on bedside US suggesting acute mastitis w/o abscess. Arrived tachycardic, has elevated WBC of 20, meets criteria for sepsis. Multiple boluses provided for fluid resuscitation and IV vanc.   See related encounter note   Lyndal Pulley, MD 02/25/15 [phone]

## 2015-02-19 NOTE — Progress Notes (Signed)
Pharmacy Code Sepsis Protocol  Time of code sepsis page: 21:05  Nurse already pulled abx. Abx ordered an hour before code called. Abx administered at 2115.  Were antibiotics ordered at the time of the code sepsis page? Yes Was it required to contact the physician?  Physician not contacted  Physician contacted to order antibiotics for code sepsis  Physician contacted to recommend changing antibiotics  Pharmacy consulted for: none  Anti-infectives    Start     Dose/Rate Route Frequency Ordered Stop   02/19/15 2015  vancomycin (VANCOCIN) IVPB 1000 mg/200 mL premix     1,000 mg 200 mL/hr over 60 Minutes Intravenous  Once 02/19/15 2013          Nurse education provided:  Minutes left to administer antibiotics to achieve 1 hour goal  Correct order of antibiotic administration  Antibiotic Y-site compatibilities     Greggory Stallion, PharmD Clinical Pharmacy Resident Pager # [phone] 02/19/2015 9:19 PM

## 2015-02-19 NOTE — ED Notes (Signed)
CBG 80, Janett Billow RN aware

## 2015-02-19 NOTE — ED Notes (Signed)
Pt here for migraine and lump to right breast. sts doesn't feel well.

## 2015-02-19 NOTE — H&P (Addendum)
[facility] Admission History and Physical       Monica Munoz ZOX:[id number] DOB: Jun 03, 1990 DOA: 02/19/2015  Referring physician: EDP PCP: Monica Ly, PA-C  Specialists:   Chief Complaint: Redness Pain Right Breast  HPI: Monica Munoz is a 25 y.o. female with a history of Migraine headaches who presents to the ED with complaints of redness pain and swelling of the right breast noticed today.   She had her nipples pierced 3 weeks ago.  She denies fevers and chills, but was found to have a fever of 100.1 in the ED.   A Sepsis workup was initiated and she was placed on IV Vancomycin and referred for admission.      Review of Systems:  Constitutional: No Weight Loss, No Weight Gain, Night Sweats, Fevers, Chills, Dizziness, Light Headedness, Fatigue, or Generalized Weakness HEENT: No Headaches, Difficulty Swallowing,Tooth/Dental Problems,Sore Throat,  No Sneezing, Rhinitis, Ear Ache, Nasal Congestion, or Post Nasal Drip,  Cardio-vascular:  No Chest pain, Orthopnea, PND, Edema in Lower Extremities, Anasarca, Dizziness, Palpitations  Resp: No Dyspnea, No DOE, No Productive Cough, No Non-Productive Cough, No Hemoptysis, No Wheezing.    GI: No Heartburn, Indigestion, Abdominal Pain, Nausea, Vomiting, Diarrhea, Constipation, Hematemesis, Hematochezia, Melena, Change in Bowel Habits,  Loss of Appetite  GU: No Dysuria, No Change in Color of Urine, No Urgency or Urinary Frequency, No Flank pain.  Musculoskeletal: No Joint Pain or Swelling, No Decreased Range of Motion, No Back Pain.  Neurologic: No Syncope, No Seizures, Muscle Weakness, Paresthesia, Vision Disturbance or Loss, No Diplopia, No Vertigo, No Difficulty Walking,  Skin: +Redness of the Right Breast Psych: No Change in Mood or Affect, No Depression or Anxiety, No Memory loss, No Confusion, or Hallucinations   Past Medical History  Diagnosis Date  . Asthma   . Migraine with aura 04/25/2012  . Depression   . Anxiety       Past Surgical History  Procedure Laterality Date  . Wisdom tooth extraction        Prior to Admission medications   Medication Sig Start Date End Date Taking? Authorizing Provider  acetaminophen (TYLENOL) 500 MG tablet Take 1,000 mg by mouth daily as needed for mild pain or headache.   Yes Historical Provider, MD  butalbital-acetaminophen-caffeine (FIORICET WITH CODEINE) 50-325-40-30 MG capsule Take 1 capsule by mouth every 4 (four) hours as needed for headache or migraine.   Yes Historical Provider, MD  albuterol (PROVENTIL HFA;VENTOLIN HFA) 108 (90 BASE) MCG/ACT inhaler Inhale 2 puffs into the lungs every 6 (six) hours as needed for wheezing or shortness of breath.     Historical Provider, MD  ibuprofen (ADVIL,MOTRIN) 600 MG tablet Take 1 tablet (600 mg total) by mouth every 6 (six) hours. Patient not taking: Reported on 02/19/2015 10/14/14   Marquette Saa, MD     Allergies  Allergen Reactions  . Benadryl [Diphenhydramine Hcl] Other (See Comments)    Red Man's and Sjogren's  . Diphenhydramine Hcl     Other reaction(s): Redness  . Lorazepam     Patient states that it makes her "loopy" and "off the wall"  . Adhesive [Tape] Dermatitis    hypofix tape only - itching    Social History:  reports that she has never smoked. She has never used smokeless tobacco. She reports that she does not drink alcohol or use illicit drugs.    Family History  Problem Relation Age of Onset  . Heart murmur Mother   . Miscarriages / [place] Mother   .  Diabetes Mother   . Autism Paternal Uncle   . Down syndrome Paternal Uncle   . Stroke Maternal Grandmother   . Diabetes Maternal Grandfather   . Hyperlipidemia Maternal Grandfather   . Stroke Maternal Grandfather        Physical Exam:  GEN:  Pleasant Well Nourished and Well Developed 25 y.o. female examined and in no acute distress; cooperative with exam Filed Vitals:   02/19/15 1735 02/19/15 1839  BP: 113/65 111/68  Pulse:  120 111  Temp: 98.9 F (37.2 C) 100.2 F (37.9 C)  TempSrc: Oral Oral  Resp: 18 20  SpO2: 95% 100%   Blood pressure 111/68, pulse 111, temperature 100.2 F (37.9 C), temperature source Oral, resp. rate 20, last menstrual period 02/06/2015, SpO2 100 %, unknown if currently breastfeeding. PSYCH: She is alert and oriented x4; does not appear anxious does not appear depressed; affect is normal HEENT: Normocephalic and Atraumatic, Mucous membranes pink; PERRLA; EOM intact; Fundi:  Benign;  No scleral icterus, Nares: Patent, Oropharynx: Clear, Fair Dentition,    Neck:  FROM, No Cervical Lymphadenopathy nor Thyromegaly or Carotid Bruit; No JVD; Breasts:: +Erythema of right breast Areola and Nipple CHEST WALL: No tenderness CHEST: Normal respiration, clear to auscultation bilaterally HEART: Regular rate and rhythm; no murmurs rubs or gallops BACK: No kyphosis or scoliosis; No CVA tenderness ABDOMEN: Positive Bowel Sounds, Soft Non-Tender, No Rebound or Guarding; No Masses, No Organomegaly, Rectal Exam: Not done EXTREMITIES: No Cyanosis, Clubbing, or Edema; No Ulcerations. Genitalia: not examined PULSES: 2+ and symmetric SKIN: Normal hydration no rash or ulceration CNS:  Alert and Oriented x 4, No Focal Deficits Vascular: pulses palpable throughout    Labs on Admission:  Basic Metabolic Panel:  Recent Labs Lab 02/19/15 1752  NA 140  K 4.8  CL 105  CO2 24  GLUCOSE 117*  BUN 8  CREATININE 0.77  CALCIUM 9.0   Liver Function Tests: No results for input(s): AST, ALT, ALKPHOS, BILITOT, PROT, ALBUMIN in the last 168 hours. No results for input(s): LIPASE, AMYLASE in the last 168 hours. No results for input(s): AMMONIA in the last 168 hours. CBC:  Recent Labs Lab 02/19/15 1752  WBC 20.0*  HGB 14.1  HCT 42.4  MCV 94.9  PLT 238   Cardiac Enzymes: No results for input(s): CKTOTAL, CKMB, CKMBINDEX, TROPONINI in the last 168 hours.  BNP (last 3 results) No results for  input(s): BNP in the last 8760 hours.  ProBNP (last 3 results) No results for input(s): PROBNP in the last 8760 hours.  CBG:  Recent Labs Lab 02/19/15 2032  GLUCAP 80    Radiological Exams on Admission: No results found.         Assessment/Plan:   25 y.o. female with  Active Problems:   1.     Cellulitis of right breast   IV Vancomycin     2.     Sepsis (HCC)   Sepsis Protocol Inititated     3.     Leukocytosis   Monitor WBC Trend     4.     Migraine   Pain Rx PRN with PO Oxycodone, or IV Dilaudid      5.     DVT Prophylaxis   Lovenox    Code Status:     FULL CODE        Family Communication:   Family at Bedside   No Family Present    Disposition Plan:    Inpatient  Observation Status  Time spent:  70 Minutes      Ron Parker [facility] Pager [phone]   If 7AM -7PM Please Contact the Day Rounding Team MD for [facility]  If 7PM-7AM, Please Contact Night-Floor Coverage  [email or web address] Password [email or web address] 02/19/2015, 9:52 PM     ADDENDUM:   Patient was seen and examined on 02/19/2015

## 2015-02-19 NOTE — ED Notes (Signed)
Pt's mother approached desk and stated that pt's "eyes were rolling back in her head." I went to check on pt and found her to be alert and oriented and without any signs of distress. Pt states that she "feels high" and denies taking anything. Vitals were rechecked per request, and pt updated on waiting room policies and procedures.

## 2015-02-20 DIAGNOSIS — N61 Mastitis without abscess: Secondary | ICD-10-CM

## 2015-02-20 LAB — BASIC METABOLIC PANEL
ANION GAP: 8 (ref 5–15)
BUN: 6 mg/dL (ref 6–20)
CHLORIDE: 110 mmol/L (ref 101–111)
CO2: 21 mmol/L — ABNORMAL LOW (ref 22–32)
Calcium: 8.7 mg/dL — ABNORMAL LOW (ref 8.9–10.3)
Creatinine, Ser: 0.66 mg/dL (ref 0.44–1.00)
GLUCOSE: 173 mg/dL — AB (ref 65–99)
POTASSIUM: 4.1 mmol/L (ref 3.5–5.1)
SODIUM: 139 mmol/L (ref 135–145)

## 2015-02-20 LAB — CBC
HEMATOCRIT: 40.2 % (ref 36.0–46.0)
HEMOGLOBIN: 13.3 g/dL (ref 12.0–15.0)
MCH: 31.5 pg (ref 26.0–34.0)
MCHC: 33.1 g/dL (ref 30.0–36.0)
MCV: 95.3 fL (ref 78.0–100.0)
Platelets: 229 10*3/uL (ref 150–400)
RBC: 4.22 MIL/uL (ref 3.87–5.11)
RDW: 12.5 % (ref 11.5–15.5)
WBC: 23.1 10*3/uL — AB (ref 4.0–10.5)

## 2015-02-20 MED ORDER — SULFAMETHOXAZOLE-TRIMETHOPRIM 800-160 MG PO TABS
1.0000 | ORAL_TABLET | Freq: Two times a day (BID) | ORAL | Status: DC
  Administered 2015-02-20 – 2015-02-21 (×3): 1 via ORAL
  Filled 2015-02-20 (×3): qty 1

## 2015-02-20 MED ORDER — BUTALBITAL-APAP-CAFFEINE 50-325-40 MG PO TABS
2.0000 | ORAL_TABLET | ORAL | Status: DC | PRN
  Administered 2015-02-20 – 2015-02-23 (×3): 2 via ORAL
  Filled 2015-02-20 (×3): qty 2

## 2015-02-20 MED ORDER — HYDROXYZINE HCL 50 MG/ML IM SOLN
50.0000 mg | Freq: Four times a day (QID) | INTRAMUSCULAR | Status: DC | PRN
  Administered 2015-02-20: 50 mg via INTRAMUSCULAR
  Filled 2015-02-20 (×2): qty 1

## 2015-02-20 NOTE — Progress Notes (Signed)
   Monica Munoz ZOX:[id number] DOB: 01/19/1990 DOA: 02/19/2015 PCP: Marshia Ly, PA-C  Brief narrative: 25 year old female recent nipple piercing presents with swelling pain and discomfort in the right breast Admitted for early sepsis possible secondary to breast cellulitis  Past medical history-As per Problem list Chart reviewed as below-   Consultants:  None  Procedures:  None  Antibiotics:  Vancomycin IV 02/19/15-02/20/15  Bactrim DS 02/20/15   Subjective  Alert pleasant still has some discomfort in the right breast No fever no chills No nausea vomiting No dysuria No vaginal discharge Did have some flushing and headaches earlier which resolved with slowing down the vancomycin   Objective    Interim History: None  Telemetry: None   Objective: Filed Vitals:   02/19/15 2218 02/19/15 2300 02/20/15 0539 02/20/15 0923  BP: 115/67 114/70 100/58 117/59  Pulse: 110 98 98 92  Temp: 99.8 F (37.7 C) 98.7 F (37.1 C) 98.5 F (36.9 C) 97.7 F (36.5 C)  TempSrc: Oral Oral Oral Oral  Resp: SpO2: 100% 100% 100% 97%    Intake/Output Summary (Last 24 hours) at 02/20/15 1024 Last data filed at 02/20/15 0900  Gross per 24 hour  Intake   2120 ml  Output      0 ml  Net   2120 ml    Exam:  General: EOMI NCAT no pallor no icterus Cardiovascular: S1-S2 no murmur rub or gallop Respiratory: Clear Abdomen: Soft nontender Skin some slight erythema to the right breast however no specific abscess Neuro intact  Data Reviewed: Basic Metabolic Panel:  Recent Labs Lab 02/19/15 1752 02/20/15 0627  NA 140 139  K 4.8 4.1  CL 105 110  CO2 24 21*  GLUCOSE 117* 173*  BUN 8 6  CREATININE 0.77 0.66  CALCIUM 9.0 8.7*   Liver Function Tests: No results for input(s): AST, ALT, ALKPHOS, BILITOT, PROT, ALBUMIN in the last 168 hours. No results for input(s): LIPASE, AMYLASE in the last 168 hours. No results for input(s): AMMONIA in the last 168  hours. CBC:  Recent Labs Lab 02/19/15 1752 02/20/15 0627  WBC 20.0* 23.1*  HGB 14.1 13.3  HCT 42.4 40.2  MCV 94.9 95.3  PLT 238 229   Cardiac Enzymes: No results for input(s): CKTOTAL, CKMB, CKMBINDEX, TROPONINI in the last 168 hours. BNP: Invalid input(s): POCBNP CBG:  Recent Labs Lab 02/19/15 2032  GLUCAP 80    No results found for this or any previous visit (from the past 240 hour(s)).   Studies:              All Imaging reviewed and is as per above notation   Scheduled Meds: . enoxaparin (LOVENOX) injection  40 mg Subcutaneous Q24H  . sodium chloride  1,000 mL Intravenous Once  . sulfamethoxazole-trimethoprim  1 tablet Oral Q12H   Continuous Infusions: . sodium chloride 100 mL/hr at 02/20/15 0818     Assessment/Plan:  1.  Cellulitis of right breast secondary to nipple piercing-transitioned from IV vancomycin to Bactrim DS every 12 hourly. No noted abscess or drainable source therefore expect can be treated conservatively with oral antibiotic for 10 day duration. Continue oxycodone 5 mg every 4 when necessary moderate pain and Toradol if needed 2. Headache-schedule Fioricet 2 tabs every 4 when necessary headaches    Pleas Koch, MD  [facility] Pager [phone] 02/20/2015, 10:24 AM    LOS: 1 day

## 2015-02-20 NOTE — Progress Notes (Signed)
Pt has a scheduled vancomycin at 6am, called pharmacy to verify since pt had an earlier reaction which is generalized itchiness after receiving vancomycin in the ED, Pharmicist stated that they changed the rate of the vancomycin which is to run for 2 hrs since the reaction might be rate related.

## 2015-02-21 LAB — CBC WITH DIFFERENTIAL/PLATELET
BASOS ABS: 0 10*3/uL (ref 0.0–0.1)
BASOS PCT: 0 %
EOS ABS: 0.1 10*3/uL (ref 0.0–0.7)
EOS PCT: 0 %
HCT: 32.8 % — ABNORMAL LOW (ref 36.0–46.0)
Hemoglobin: 10.9 g/dL — ABNORMAL LOW (ref 12.0–15.0)
Lymphocytes Relative: 16 %
Lymphs Abs: 3.4 10*3/uL (ref 0.7–4.0)
MCH: 31.4 pg (ref 26.0–34.0)
MCHC: 33.2 g/dL (ref 30.0–36.0)
MCV: 94.5 fL (ref 78.0–100.0)
MONO ABS: 0.8 10*3/uL (ref 0.1–1.0)
Monocytes Relative: 4 %
NEUTROS ABS: 16.2 10*3/uL — AB (ref 1.7–7.7)
Neutrophils Relative %: 80 %
PLATELETS: 222 10*3/uL (ref 150–400)
RBC: 3.47 MIL/uL — ABNORMAL LOW (ref 3.87–5.11)
RDW: 12.5 % (ref 11.5–15.5)
WBC: 20.5 10*3/uL — ABNORMAL HIGH (ref 4.0–10.5)

## 2015-02-21 MED ORDER — VANCOMYCIN HCL 10 G IV SOLR
1250.0000 mg | Freq: Two times a day (BID) | INTRAVENOUS | Status: DC
  Administered 2015-02-21: 1250 mg via INTRAVENOUS
  Filled 2015-02-21 (×2): qty 1250

## 2015-02-21 MED ORDER — HYDROXYZINE HCL 25 MG PO TABS
25.0000 mg | ORAL_TABLET | Freq: Two times a day (BID) | ORAL | Status: DC
  Administered 2015-02-21 – 2015-02-23 (×5): 25 mg via ORAL
  Filled 2015-02-21 (×5): qty 1

## 2015-02-21 MED ORDER — VANCOMYCIN HCL 10 G IV SOLR
1250.0000 mg | Freq: Two times a day (BID) | INTRAVENOUS | Status: DC
  Administered 2015-02-22 – 2015-02-23 (×3): 1250 mg via INTRAVENOUS
  Filled 2015-02-21 (×5): qty 1250

## 2015-02-21 MED ORDER — IBUPROFEN 400 MG PO TABS
400.0000 mg | ORAL_TABLET | Freq: Four times a day (QID) | ORAL | Status: DC
  Administered 2015-02-21 – 2015-02-23 (×9): 400 mg via ORAL
  Filled 2015-02-21 (×3): qty 1
  Filled 2015-02-21: qty 2
  Filled 2015-02-21 (×4): qty 1
  Filled 2015-02-21: qty 2

## 2015-02-21 NOTE — Progress Notes (Addendum)
Pharmacy Antibiotic Note  Monica Munoz is a 25 y.o. female admitted on 02/19/2015 with cellulitis.  Pharmacy has been consulted to resume vancomcyin dosing with continued leukocytosis.   Plan: Vancomycin  IV every 12 hours.  Goal trough 10-15 mcg/mL.  - Add Atarax for itching with Vancomycin therapy (Dr. Mahala Menghini) Monitor culture data, renal function and clinical course [facility] at [facility]  Temp (24hrs), Avg:98.3 F (36.8 C), Min:97.9 F (36.6 C), Max:98.7 F (37.1 C)   Recent Labs Lab 02/19/15 1752 02/19/15 2023 02/19/15 2136 02/19/15 2323 02/20/15 0627 02/21/15 0555  WBC 20.0*  --   --   --  23.1* 20.5*  CREATININE 0.77  --   --   --  0.66  --   LATICACIDVEN  --  1.2 1.07 1.1  --   --     CrCl cannot be calculated (Unknown ideal weight.).    Allergies  Allergen Reactions  . Benadryl [Diphenhydramine Hcl] Other (See Comments)    Red Man's and Sjogren's  . Diphenhydramine Hcl     Other reaction(s): Redness  . Lorazepam     Patient states that it makes her "loopy" and "off the wall"  . Vancomycin Itching    itching  . Adhesive [Tape] Dermatitis    hypofix tape only - itching   Antimicrobials this admission: Vanc 2/3 >>2/4, 2/5 >> Septra DS oral 2/4 >>2/5   Dose adjustments this admission: Microbiology results:  BCx: NG x 2 days  UCx:    Sputum:    MRSA PCR:   Nadara Mustard, PharmD., MS Clinical Pharmacist Pager:  [phone] Thank you for allowing pharmacy to be part of this patients care team. 02/21/2015 10:47 AM

## 2015-02-21 NOTE — Progress Notes (Signed)
Monica Munoz ZOX:[id number] DOB: 1990/05/24 DOA: 02/19/2015 PCP: Marshia Ly, PA-C  Brief narrative: 25 year old female recent nipple piercing presents with swelling pain and discomfort in the right breast Admitted for early sepsis possible secondary to breast cellulitis  Past medical history-As per Problem list Chart reviewed as below-   Consultants:  None  Procedures:  None  Antibiotics:  Vancomycin IV 02/19/15-02/20/15  Bactrim DS 02/20/15  Vanc 2/5>>   Subjective   Discomfrot in R amr and pain  in axilla No fever No cp No n/v   Objective    Interim History: None  Telemetry: None   Objective: Filed Vitals:   02/20/15 2208 02/21/15 0214 02/21/15 0555 02/21/15 1003  BP: 114/70 127/79 117/60 122/73  Pulse: 87  74 89  Temp: 97.9 F (36.6 C) 98.6 F (37 C) 98.1 F (36.7 C) 98.7 F (37.1 C)  TempSrc: Oral Oral Oral Oral  Resp: SpO2: 97% 98% 99% 100%    Intake/Output Summary (Last 24 hours) at 02/21/15 1027 Last data filed at 02/21/15 0947  Gross per 24 hour  Intake   2056 ml  Output   1200 ml  Net    856 ml    Exam:  General: EOMI NCAT no pallor no icterus Cardiovascular: S1-S2 no murmur rub or gallop Respiratory: Clear Abdomen: Soft nontender Skin some slight erythema to the right breast however no specific abscess, axillary discomfort and swollen lymph nodes Neuro intact  Data Reviewed: Basic Metabolic Panel:  Recent Labs Lab 02/19/15 1752 02/20/15 0627  NA 140 139  K 4.8 4.1  CL 105 110  CO2 24 21*  GLUCOSE 117* 173*  BUN 8 6  CREATININE 0.77 0.66  CALCIUM 9.0 8.7*   Liver Function Tests: No results for input(s): AST, ALT, ALKPHOS, BILITOT, PROT, ALBUMIN in the last 168 hours. No results for input(s): LIPASE, AMYLASE in the last 168 hours. No results for input(s): AMMONIA in the last 168 hours. CBC:  Recent Labs Lab 02/19/15 1752 02/20/15 0627 02/21/15 0555  WBC 20.0* 23.1* 20.5*  NEUTROABS  --   --   16.2*  HGB 14.1 13.3 10.9*  HCT 42.4 40.2 32.8*  MCV 94.9 95.3 94.5  PLT 238 229 222   Cardiac Enzymes: No results for input(s): CKTOTAL, CKMB, CKMBINDEX, TROPONINI in the last 168 hours. BNP: Invalid input(s): POCBNP CBG:  Recent Labs Lab 02/19/15 2032  GLUCAP 80    Recent Results (from the past 240 hour(s))  Culture, blood (Routine X 2) w Reflex to ID Panel     Status: None (Preliminary result)   Collection Time: 02/19/15  9:24 PM  Result Value Ref Range Status   Specimen Description BLOOD LEFT ANTECUBITAL  Final   Special Requests BOTTLES DRAWN AEROBIC AND ANAEROBIC 8CC  Final   Culture NO GROWTH < 24 HOURS  Final   Report Status PENDING  Incomplete  Culture, blood (Routine X 2) w Reflex to ID Panel     Status: None (Preliminary result)   Collection Time: 02/19/15  9:24 PM  Result Value Ref Range Status   Specimen Description BLOOD LEFT FOREARM  Final   Special Requests IN PEDIATRIC BOTTLE 1CC  Final   Culture NO GROWTH < 24 HOURS  Final   Report Status PENDING  Incomplete     Studies:              All Imaging reviewed and is as per above notation   Scheduled Meds: . enoxaparin (LOVENOX)  injection  40 mg Subcutaneous Q24H  . ibuprofen  400 mg Oral QID  . sodium chloride  1,000 mL Intravenous Once   Continuous Infusions: . sodium chloride 100 mL/hr at 02/21/15 0321     Assessment/Plan:  1.  Cellulitis of right breast secondary to nipple piercing-transitioned back to IV vancomycin.  Add Ibufprofen Continue oxycodone 5 mg every 4 when necessary moderate pain and Toradol if needed.  Cbc in am 2. Headache-schedule Fioricet 2 tabs every 4 when necessary headaches  Expect will be here another 48 hrs  Pleas Koch, MD  [facility] Pager [phone] 02/21/2015, 10:27 AM    LOS: 2 days

## 2015-02-22 LAB — CBC WITH DIFFERENTIAL/PLATELET
BASOS ABS: 0 10*3/uL (ref 0.0–0.1)
BASOS PCT: 0 %
EOS PCT: 4 %
Eosinophils Absolute: 0.4 10*3/uL (ref 0.0–0.7)
HCT: 34.3 % — ABNORMAL LOW (ref 36.0–46.0)
Hemoglobin: 11.4 g/dL — ABNORMAL LOW (ref 12.0–15.0)
LYMPHS PCT: 44 %
Lymphs Abs: 4 10*3/uL (ref 0.7–4.0)
MCH: 31.7 pg (ref 26.0–34.0)
MCHC: 33.2 g/dL (ref 30.0–36.0)
MCV: 95.3 fL (ref 78.0–100.0)
MONO ABS: 0.6 10*3/uL (ref 0.1–1.0)
Monocytes Relative: 6 %
Neutro Abs: 4.1 10*3/uL (ref 1.7–7.7)
Neutrophils Relative %: 46 %
PLATELETS: 254 10*3/uL (ref 150–400)
RBC: 3.6 MIL/uL — AB (ref 3.87–5.11)
RDW: 12.6 % (ref 11.5–15.5)
WBC: 9 10*3/uL (ref 4.0–10.5)

## 2015-02-22 LAB — BASIC METABOLIC PANEL
Anion gap: 7 (ref 5–15)
CALCIUM: 7.9 mg/dL — AB (ref 8.9–10.3)
CO2: 24 mmol/L (ref 22–32)
Chloride: 109 mmol/L (ref 101–111)
Creatinine, Ser: 0.8 mg/dL (ref 0.44–1.00)
GFR calc Af Amer: >60 mL/min (ref >60)
Glucose, Bld: 110 mg/dL — ABNORMAL HIGH (ref 65–99)
POTASSIUM: 3.6 mmol/L (ref 3.5–5.1)
Sodium: 140 mmol/L (ref 135–145)

## 2015-02-22 MED ORDER — HYDROMORPHONE HCL 1 MG/ML IJ SOLN
0.5000 mg | INTRAMUSCULAR | Status: DC | PRN
  Administered 2015-02-22 – 2015-02-23 (×3): 1 mg via INTRAVENOUS
  Filled 2015-02-22 (×3): qty 1

## 2015-02-22 MED ORDER — OXYCODONE HCL 5 MG PO TABS
5.0000 mg | ORAL_TABLET | ORAL | Status: DC | PRN
  Administered 2015-02-22 – 2015-02-23 (×4): 10 mg via ORAL
  Filled 2015-02-22 (×5): qty 2

## 2015-02-22 NOTE — Progress Notes (Signed)
Monica Munoz ZOX:[id number] DOB: 12/06/1990 DOA: 02/19/2015 PCP: Marshia Ly, PA-C  Brief narrative: 25 year old female recent nipple piercing presents with swelling pain and discomfort in the right breast Admitted for early sepsis possible secondary to breast cellulitis  Past medical history-As per Problem list Chart reviewed as below-   Consultants:  None  Procedures:  None  Antibiotics:  Vancomycin IV 02/19/15-02/20/15  Bactrim DS 02/20/15  Vanc 2/5>>   Subjective   Fair No n/v/cp Eating and drinking   Objective    Interim History: None  Telemetry: None   Objective: Filed Vitals:   02/21/15 2055 02/22/15 0122 02/22/15 0533 02/22/15 1441  BP: 108/61 117/71 108/59 124/74  Pulse: 78 78 90 72  Temp: 98.3 F (36.8 C) 97.8 F (36.6 C) 98.3 F (36.8 C) 98.2 F (36.8 C)  TempSrc: Oral   Oral  Resp: Height:      Weight:      SpO2: 98% 98% 98% 94%    Intake/Output Summary (Last 24 hours) at 02/22/15 1506 Last data filed at 02/22/15 1441  Gross per 24 hour  Intake   5652 ml  Output   1100 ml  Net   4552 ml    Exam:  General: EOMI NCAT no pallor no icterus Cardiovascular: S1-S2 no murmur rub or gallop Respiratory: Clear Abdomen: Soft nontender Skin some slight erythema to the right breast however no specific abscess, axillary discomfort and swollen lymph nodes Neuro intact  Data Reviewed: Basic Metabolic Panel:  Recent Labs Lab 02/19/15 1752 02/20/15 0627 02/22/15 0439  NA 140 139 140  K 4.8 4.1 3.6  CL 105 110 109  CO2 24 21* 24  GLUCOSE 117* 173* 110*  BUN 8 6 <5*  CREATININE 0.77 0.66 0.80  CALCIUM 9.0 8.7* 7.9*   Liver Function Tests: No results for input(s): AST, ALT, ALKPHOS, BILITOT, PROT, ALBUMIN in the last 168 hours. No results for input(s): LIPASE, AMYLASE in the last 168 hours. No results for input(s): AMMONIA in the last 168 hours. CBC:  Recent Labs Lab 02/19/15 1752 02/20/15 0627 02/21/15 0555  02/22/15 0824  WBC 20.0* 23.1* 20.5* 9.0  NEUTROABS  --   --  16.2* 4.1  HGB 14.1 13.3 10.9* 11.4*  HCT 42.4 40.2 32.8* 34.3*  MCV 94.9 95.3 94.5 95.3  PLT 238 229 222 254   Cardiac Enzymes: No results for input(s): CKTOTAL, CKMB, CKMBINDEX, TROPONINI in the last 168 hours. BNP: Invalid input(s): POCBNP CBG:  Recent Labs Lab 02/19/15 2032  GLUCAP 80    Recent Results (from the past 240 hour(s))  Culture, blood (Routine X 2) w Reflex to ID Panel     Status: None (Preliminary result)   Collection Time: 02/19/15  9:24 PM  Result Value Ref Range Status   Specimen Description BLOOD LEFT ANTECUBITAL  Final   Special Requests BOTTLES DRAWN AEROBIC AND ANAEROBIC 8CC  Final   Culture NO GROWTH 3 DAYS  Final   Report Status PENDING  Incomplete  Culture, blood (Routine X 2) w Reflex to ID Panel     Status: None (Preliminary result)   Collection Time: 02/19/15  9:24 PM  Result Value Ref Range Status   Specimen Description BLOOD LEFT FOREARM  Final   Special Requests IN PEDIATRIC BOTTLE 1CC  Final   Culture NO GROWTH 3 DAYS  Final   Report Status PENDING  Incomplete     Studies:  All Imaging reviewed and is as per above notation   Scheduled Meds: . enoxaparin (LOVENOX) injection  40 mg Subcutaneous Q24H  . hydrOXYzine  25 mg Oral Q12H  . ibuprofen  400 mg Oral QID  . sodium chloride  1,000 mL Intravenous Once  . vancomycin  1,250 mg Intravenous Q12H   Continuous Infusions: . sodium chloride 100 mL/hr at 02/22/15 0433     Assessment/Plan:  1.  Cellulitis of right breast secondary to nipple piercing-transitioned back to IV vancomycin.  Add Ibufprofen Continue oxycodone 5 mg every 4 when necessary moderate pain and Toradol if needed.  Cbc shows resolution to WBC down to 9.5.  Transition to po bactrim in am 2. Headache-schedule Fioricet 2 tabs every 4 when necessary headaches  Expect will be here another 48 hrs  Pleas Koch, MD  [facility] Pager  [phone] 02/22/2015, 3:06 PM    LOS: 3 days

## 2015-02-23 DIAGNOSIS — G43709 Chronic migraine without aura, not intractable, without status migrainosus: Secondary | ICD-10-CM

## 2015-02-23 DIAGNOSIS — D72829 Elevated white blood cell count, unspecified: Secondary | ICD-10-CM

## 2015-02-23 DIAGNOSIS — A419 Sepsis, unspecified organism: Principal | ICD-10-CM

## 2015-02-23 MED ORDER — OXYCODONE HCL 5 MG PO TABS: 5.0000 mg | ORAL_TABLET | ORAL | Status: DC | PRN

## 2015-02-23 MED ORDER — SULFAMETHOXAZOLE-TRIMETHOPRIM 800-160 MG PO TABS: 1.0000 | ORAL_TABLET | Freq: Two times a day (BID) | ORAL | Status: DC

## 2015-02-23 NOTE — Discharge Summary (Signed)
Physician Discharge Summary  Monica Munoz AOZ:[id number] DOB: 10-Aug-1990 DOA: 02/19/2015  PCP: Malen Gauze A, PA-C  Admit date: 02/19/2015 Discharge date: 02/23/2015  Time spent: 45 minutes  Recommendations for Outpatient Follow-up:  1. Complete  7 more days of Bactrim DS for emepiric abx coverage of breast celluliits 2. Pain medicine Rx given on d/c  Discharge Diagnoses:  Active Problems:   Cellulitis of right breast   Sepsis (HCC)   Leukocytosis   Migraine   Discharge Condition: imporved  Diet recommendation:  reg  Filed Weights   02/21/15 1222  Weight: 76.658 kg (169 lb)    History of present illness:   25 year old female recent nipple piercing presents with swelling pain and discomfort in the right breast Admitted for early sepsis possible secondary to breast cellulitis  Hospital Course:   1. Cellulitis of right breast secondary to nipple piercing-transitioned back to IV vancomycin. Add Ibufprofen Continue oxycodone 5 mg every 4 when necessary moderate pain and Toradol if needed. Cbc shows resolution to WBC down to 9.5. Transitioned back to by mouth Bactrim and counseled patient to use cold compresses under the arm and monitor as an outpatient 2. Headache-schedule Fioricet 2 tabs every 4 when necessary headaches and was given in the hospital and this was discontinued on discharge as headaches seem to have resolved 3. Constipation-I've recommended she try prune juice and expect that this will resolve with decreasing narcotic dosing and increase in mobility as an outpatient  Antibiotics: Vancomycin IV 02/19/15-02/20/15 Bactrim DS 02/20/15 Vanc 2/5>>  Discharge Exam: Filed Vitals:   02/23/15 0221 02/23/15 0606  BP: 117/77 117/80  Pulse: 83 78  Temp: 98 F (36.7 C) 98.3 F (36.8 C)  Resp: 18 17    General: Alert and oriented no apparent distress tolerating diet Cardiovascular: S1-S2 no murmur rub or gallop Respiratory: Clinically clear Some axillary  lymphadenopathy but rest. Does not appear engorged red or tender and no other issues  Discharge Instructions   Discharge Instructions    Diet - low sodium heart healthy    Complete by:  As directed      Discharge instructions    Complete by:  As directed   Complete the entire course of antibiotics. A limited prescription of pain meds has been called in for you Please follow up with your regular MD soon     Increase activity slowly    Complete by:  As directed           Current Discharge Medication List    START taking these medications   Details  oxyCODONE (OXY IR/ROXICODONE) 5 MG immediate release tablet Take 1-2 tablets (5-10 mg total) by mouth every 4 (four) hours as needed for moderate pain. Qty: 30 tablet, Refills: 0    sulfamethoxazole-trimethoprim (BACTRIM DS,SEPTRA DS) 800-160 MG tablet Take 1 tablet by mouth 2 (two) times daily. Qty: 14 tablet, Refills: 0      CONTINUE these medications which have NOT CHANGED   Details  acetaminophen (TYLENOL) 500 MG tablet Take 1,000 mg by mouth daily as needed for mild pain or headache.    albuterol (PROVENTIL HFA;VENTOLIN HFA) 108 (90 BASE) MCG/ACT inhaler Inhale 2 puffs into the lungs every 6 (six) hours as needed for wheezing or shortness of breath.     ibuprofen (ADVIL,MOTRIN) 600 MG tablet Take 1 tablet (600 mg total) by mouth every 6 (six) hours. Qty: 90 tablet, Refills: 1      STOP taking these medications     butalbital-acetaminophen-caffeine (FIORICET WITH CODEINE)  60-630-16-01 MG capsule        Allergies  Allergen Reactions  . Benadryl [Diphenhydramine Hcl] Other (See Comments)    Red Man's and Sjogren's  . Diphenhydramine Hcl     Other reaction(s): Redness  . Lorazepam     Patient states that it makes her "loopy" and "off the wall"  . Vancomycin Itching    itching  . Adhesive [Tape] Dermatitis    hypofix tape only - itching      The results of significant diagnostics from this hospitalization  (including imaging, microbiology, ancillary and laboratory) are listed below for reference.    Significant Diagnostic Studies: No results found.  Microbiology: Recent Results (from the past 240 hour(s))  Culture, blood (Routine X 2) w Reflex to ID Panel     Status: None (Preliminary result)   Collection Time: 02/19/15  9:24 PM  Result Value Ref Range Status   Specimen Description BLOOD LEFT ANTECUBITAL  Final   Special Requests BOTTLES DRAWN AEROBIC AND ANAEROBIC 8CC  Final   Culture NO GROWTH 3 DAYS  Final   Report Status PENDING  Incomplete  Culture, blood (Routine X 2) w Reflex to ID Panel     Status: None (Preliminary result)   Collection Time: 02/19/15  9:24 PM  Result Value Ref Range Status   Specimen Description BLOOD LEFT FOREARM  Final   Special Requests IN PEDIATRIC BOTTLE 1CC  Final   Culture NO GROWTH 3 DAYS  Final   Report Status PENDING  Incomplete     Labs: Basic Metabolic Panel:  Recent Labs Lab 02/19/15 1752 02/20/15 0627 02/22/15 0439  NA 140 139 140  K 4.8 4.1 3.6  CL 105 110 109  CO2 24 21* 24  GLUCOSE 117* 173* 110*  BUN 8 6 <5*  CREATININE 0.77 0.66 0.80  CALCIUM 9.0 8.7* 7.9*   Liver Function Tests: No results for input(s): AST, ALT, ALKPHOS, BILITOT, PROT, ALBUMIN in the last 168 hours. No results for input(s): LIPASE, AMYLASE in the last 168 hours. No results for input(s): AMMONIA in the last 168 hours. CBC:  Recent Labs Lab 02/19/15 1752 02/20/15 0627 02/21/15 0555 02/22/15 0824  WBC 20.0* 23.1* 20.5* 9.0  NEUTROABS  --   --  16.2* 4.1  HGB 14.1 13.3 10.9* 11.4*  HCT 42.4 40.2 32.8* 34.3*  MCV 94.9 95.3 94.5 95.3  PLT 238 229 222 254   Cardiac Enzymes: No results for input(s): CKTOTAL, CKMB, CKMBINDEX, TROPONINI in the last 168 hours. BNP: BNP (last 3 results) No results for input(s): BNP in the last 8760 hours.  ProBNP (last 3 results) No results for input(s): PROBNP in the last 8760 hours.  CBG:  Recent Labs Lab  02/19/15 2032  GLUCAP 80       Signed:  Rhetta Mura MD   [facility] 02/23/2015, 9:03 AM

## 2015-02-23 NOTE — Progress Notes (Signed)
Discharge instructions given and patient and family verbalized understanding. °

## 2015-02-24 LAB — CULTURE, BLOOD (ROUTINE X 2)
Culture: NO GROWTH
Culture: NO GROWTH

## 2015-04-25 ENCOUNTER — Encounter (HOSPITAL_COMMUNITY): Payer: Self-pay | Admitting: Emergency Medicine

## 2015-04-25 ENCOUNTER — Emergency Department (HOSPITAL_COMMUNITY): Payer: Medicaid Other

## 2015-04-25 ENCOUNTER — Inpatient Hospital Stay (HOSPITAL_COMMUNITY)
Admission: EM | Admit: 2015-04-25 | Discharge: 2015-04-29 | DRG: 759 | Disposition: A | Discharge status: Home / Self Care | Payer: Medicaid Other | Attending: Family Medicine | Admitting: Family Medicine

## 2015-04-25 DIAGNOSIS — R740 Nonspecific elevation of levels of transaminase and lactic acid dehydrogenase [LDH]: Secondary | ICD-10-CM | POA: Diagnosis present

## 2015-04-25 DIAGNOSIS — R748 Abnormal levels of other serum enzymes: Secondary | ICD-10-CM | POA: Diagnosis present

## 2015-04-25 DIAGNOSIS — R1084 Generalized abdominal pain: Secondary | ICD-10-CM | POA: Diagnosis not present

## 2015-04-25 DIAGNOSIS — A5402 Gonococcal vulvovaginitis, unspecified: Secondary | ICD-10-CM | POA: Diagnosis present

## 2015-04-25 DIAGNOSIS — K529 Noninfective gastroenteritis and colitis, unspecified: Secondary | ICD-10-CM | POA: Diagnosis not present

## 2015-04-25 DIAGNOSIS — A084 Viral intestinal infection, unspecified: Secondary | ICD-10-CM | POA: Diagnosis present

## 2015-04-25 DIAGNOSIS — E860 Dehydration: Secondary | ICD-10-CM | POA: Diagnosis present

## 2015-04-25 DIAGNOSIS — N738 Other specified female pelvic inflammatory diseases: Secondary | ICD-10-CM

## 2015-04-25 DIAGNOSIS — N730 Acute parametritis and pelvic cellulitis: Secondary | ICD-10-CM | POA: Diagnosis not present

## 2015-04-25 DIAGNOSIS — D72829 Elevated white blood cell count, unspecified: Secondary | ICD-10-CM | POA: Diagnosis present

## 2015-04-25 DIAGNOSIS — IMO0002 Reserved for concepts with insufficient information to code with codable children: Secondary | ICD-10-CM

## 2015-04-25 DIAGNOSIS — N739 Female pelvic inflammatory disease, unspecified: Secondary | ICD-10-CM

## 2015-04-25 DIAGNOSIS — A5602 Chlamydial vulvovaginitis: Secondary | ICD-10-CM | POA: Diagnosis present

## 2015-04-25 DIAGNOSIS — R109 Unspecified abdominal pain: Secondary | ICD-10-CM | POA: Diagnosis present

## 2015-04-25 DIAGNOSIS — K5900 Constipation, unspecified: Secondary | ICD-10-CM | POA: Diagnosis present

## 2015-04-25 DIAGNOSIS — R000 Tachycardia, unspecified: Secondary | ICD-10-CM

## 2015-04-25 DIAGNOSIS — J4520 Mild intermittent asthma, uncomplicated: Secondary | ICD-10-CM | POA: Diagnosis present

## 2015-04-25 DIAGNOSIS — N12 Tubulo-interstitial nephritis, not specified as acute or chronic: Secondary | ICD-10-CM

## 2015-04-25 DIAGNOSIS — N898 Other specified noninflammatory disorders of vagina: Secondary | ICD-10-CM | POA: Diagnosis not present

## 2015-04-25 DIAGNOSIS — R102 Pelvic and perineal pain unspecified side: Secondary | ICD-10-CM

## 2015-04-25 HISTORY — DX: Reserved for concepts with insufficient information to code with codable children: IMO0002

## 2015-04-25 HISTORY — DX: Female pelvic inflammatory disease, unspecified: N73.9

## 2015-04-25 LAB — COMPREHENSIVE METABOLIC PANEL
ALBUMIN: 3.7 g/dL (ref 3.5–5.0)
ALT: 142 U/L — ABNORMAL HIGH (ref 14–54)
ANION GAP: 12 (ref 5–15)
AST: 125 U/L — ABNORMAL HIGH (ref 15–41)
Alkaline Phosphatase: 87 U/L (ref 38–126)
BILIRUBIN TOTAL: 1 mg/dL (ref 0.3–1.2)
BUN: 9 mg/dL (ref 6–20)
CALCIUM: 9.5 mg/dL (ref 8.9–10.3)
CO2: 21 mmol/L — ABNORMAL LOW (ref 22–32)
Chloride: 103 mmol/L (ref 101–111)
Creatinine, Ser: 0.78 mg/dL (ref 0.44–1.00)
GFR calc non Af Amer: >60 mL/min (ref >60)
GLUCOSE: 117 mg/dL — AB (ref 65–99)
POTASSIUM: 4.2 mmol/L (ref 3.5–5.1)
Sodium: 136 mmol/L (ref 135–145)
TOTAL PROTEIN: 7.4 g/dL (ref 6.5–8.1)

## 2015-04-25 LAB — URINE MICROSCOPIC-ADD ON

## 2015-04-25 LAB — I-STAT BETA HCG BLOOD, ED (MC, WL, AP ONLY)

## 2015-04-25 LAB — WET PREP, GENITAL
Clue Cells Wet Prep HPF POC: NONE SEEN
Sperm: NONE SEEN
TRICH WET PREP: NONE SEEN
Yeast Wet Prep HPF POC: NONE SEEN

## 2015-04-25 LAB — URINALYSIS, ROUTINE W REFLEX MICROSCOPIC
Bilirubin Urine: NEGATIVE
GLUCOSE, UA: NEGATIVE mg/dL
Ketones, ur: NEGATIVE mg/dL
NITRITE: NEGATIVE
Protein, ur: 30 mg/dL — AB
SPECIFIC GRAVITY, URINE: 1.022 (ref 1.005–1.030)
pH: 8 (ref 5.0–8.0)

## 2015-04-25 LAB — CBC
HEMATOCRIT: 39.2 % (ref 36.0–46.0)
HEMOGLOBIN: 12.8 g/dL (ref 12.0–15.0)
MCH: 30 pg (ref 26.0–34.0)
MCHC: 32.7 g/dL (ref 30.0–36.0)
MCV: 91.8 fL (ref 78.0–100.0)
Platelets: 313 10*3/uL (ref 150–400)
RBC: 4.27 MIL/uL (ref 3.87–5.11)
RDW: 13.2 % (ref 11.5–15.5)
WBC: 26.9 10*3/uL — AB (ref 4.0–10.5)

## 2015-04-25 LAB — LIPASE, BLOOD: Lipase: 32 U/L (ref 11–51)

## 2015-04-25 MED ORDER — ONDANSETRON HCL 4 MG/2ML IJ SOLN
4.0000 mg | Freq: Once | INTRAMUSCULAR | Status: AC
  Administered 2015-04-25: 4 mg via INTRAVENOUS
  Filled 2015-04-25: qty 2

## 2015-04-25 MED ORDER — IOPAMIDOL (ISOVUE-300) INJECTION 61%
INTRAVENOUS | Status: AC
  Administered 2015-04-25: 100 mL
  Filled 2015-04-25: qty 100

## 2015-04-25 MED ORDER — ALBUTEROL SULFATE (2.5 MG/3ML) 0.083% IN NEBU: 3.0000 mL | INHALATION_SOLUTION | Freq: Four times a day (QID) | RESPIRATORY_TRACT | Status: DC | PRN

## 2015-04-25 MED ORDER — DEXTROSE 5 % IV SOLN
1.0000 g | Freq: Once | INTRAVENOUS | Status: AC
  Administered 2015-04-25: 1 g via INTRAVENOUS
  Filled 2015-04-25: qty 10

## 2015-04-25 MED ORDER — SODIUM CHLORIDE 0.9 % IV BOLUS (SEPSIS)
1000.0000 mL | Freq: Once | INTRAVENOUS | Status: AC
  Administered 2015-04-25: 1000 mL via INTRAVENOUS

## 2015-04-25 MED ORDER — DEXTROSE-NACL 5-0.9 % IV SOLN
INTRAVENOUS | Status: AC
  Administered 2015-04-26 (×2): via INTRAVENOUS

## 2015-04-25 MED ORDER — CEFOTETAN DISODIUM 2 G IJ SOLR
2.0000 g | Freq: Two times a day (BID) | INTRAMUSCULAR | Status: DC
  Administered 2015-04-26 – 2015-04-27 (×4): 2 g via INTRAVENOUS
  Filled 2015-04-25 (×5): qty 2

## 2015-04-25 MED ORDER — ONDANSETRON HCL 4 MG PO TABS
4.0000 mg | ORAL_TABLET | Freq: Four times a day (QID) | ORAL | Status: DC | PRN
  Administered 2015-04-26 – 2015-04-28 (×3): 4 mg via ORAL
  Filled 2015-04-25 (×3): qty 1

## 2015-04-25 MED ORDER — HYDROMORPHONE HCL 1 MG/ML IJ SOLN
0.5000 mg | INTRAMUSCULAR | Status: DC | PRN
  Administered 2015-04-26 (×3): 0.5 mg via INTRAVENOUS
  Filled 2015-04-25 (×4): qty 1

## 2015-04-25 MED ORDER — ONDANSETRON HCL 4 MG/2ML IJ SOLN
4.0000 mg | Freq: Four times a day (QID) | INTRAMUSCULAR | Status: DC | PRN
  Administered 2015-04-26 – 2015-04-27 (×4): 4 mg via INTRAVENOUS
  Filled 2015-04-25 (×5): qty 2

## 2015-04-25 MED ORDER — SODIUM CHLORIDE 0.9 % IV BOLUS (SEPSIS)
1000.0000 mL | Freq: Once | INTRAVENOUS | Status: AC
Start: 2015-04-25 — End: 2015-04-25
  Administered 2015-04-25: 1000 mL via INTRAVENOUS

## 2015-04-25 MED ORDER — DOXYCYCLINE HYCLATE 100 MG IV SOLR
100.0000 mg | Freq: Once | INTRAVENOUS | Status: AC
  Administered 2015-04-25: 100 mg via INTRAVENOUS
  Filled 2015-04-25: qty 100

## 2015-04-25 MED ORDER — DOXYCYCLINE HYCLATE 100 MG IV SOLR
100.0000 mg | Freq: Two times a day (BID) | INTRAVENOUS | Status: DC
  Administered 2015-04-26 – 2015-04-27 (×3): 100 mg via INTRAVENOUS
  Filled 2015-04-25 (×4): qty 100

## 2015-04-25 MED ORDER — MORPHINE SULFATE (PF) 4 MG/ML IV SOLN
4.0000 mg | Freq: Once | INTRAVENOUS | Status: AC
  Administered 2015-04-25: 4 mg via INTRAVENOUS
  Filled 2015-04-25: qty 1

## 2015-04-25 NOTE — ED Provider Notes (Signed)
CSN: [id number]     Arrival date & time 04/25/15  1552 History   First MD Initiated Contact with Patient 04/25/15 1644     Chief Complaint  Patient presents with  . Abdominal Pain  . Back Pain     (Consider location/radiation/quality/duration/timing/severity/associated sxs/prior Treatment) HPI 25 y.o. female with no sig PMH presents to the ED noting a 2-3 day history of progressively worsening b/l lower back pain, now migrating diffusely throughout her abdomen and associated with a 1 day history of nausea, NB vomiting and watery nonbloody diarrhea. She has had subjective fevers and chills. She denies any known sick contacts, recent travel or suspicious food intake. She notes urinary urgency and hesitancy, but denies any significant sensation of dysuria, nor hematuria.  She notes white vaginal discharge. G2P2. LMP was in the beginning of March and was reportedly normal for her. She does note a brief episode of mild vaginal bleeding last week which was more mild than her normal menses lasting 2 days.  She denies any history of prior STIs. She is sexually active with unprotected female partners. She states that she has taken a dose of tylenol for symptomatic relief to no avail of her sx. She denies any significant tylenol ingestion. She states that she has had subjective fever and chills x 1 day. She denies any history of similar sx, nor trauma.   Past Medical History  Diagnosis Date  . Asthma   . Migraine with aura 04/25/2012  . Depression   . Anxiety    Past Surgical History  Procedure Laterality Date  . Wisdom tooth extraction     Family History  Problem Relation Age of Onset  . Heart murmur Mother   . Miscarriages / [place] Mother   . Diabetes Mother   . Autism Paternal Uncle   . Down syndrome Paternal Uncle   . Stroke Maternal Grandmother   . Diabetes Maternal Grandfather   . Hyperlipidemia Maternal Grandfather   . Stroke Maternal Grandfather    Social History  Substance Use  Topics  . Smoking status: Never Smoker   . Smokeless tobacco: Never Used  . Alcohol Use: No   OB History    Gravida Para Term Preterm AB TAB SAB Ectopic Multiple Living   0 0 0 0 0 0 2     Review of Systems  Constitutional: Positive for fever, chills, activity change, appetite change and fatigue.  HENT: Negative for congestion, rhinorrhea, sinus pressure, sneezing and sore throat.   Respiratory: Negative for cough, chest tightness and shortness of breath.   Gastrointestinal: Positive for nausea, vomiting, abdominal pain and diarrhea. Negative for constipation and abdominal distention.  Genitourinary: Positive for urgency, frequency, flank pain, vaginal bleeding, vaginal discharge, difficulty urinating and pelvic pain. Negative for dysuria, hematuria, genital sores and vaginal pain.  Musculoskeletal: Positive for back pain. Negative for myalgias, neck pain and neck stiffness.  Skin: Negative for rash and wound.  Neurological: Negative for dizziness, syncope, light-headedness and headaches.  All other systems reviewed and are negative.     Allergies  Benadryl; Diphenhydramine hcl; Lorazepam; Vancomycin; and Adhesive  Home Medications   Prior to Admission medications   Medication Sig Start Date End Date Taking? Authorizing Provider  acetaminophen (TYLENOL) 500 MG tablet Take 1,000 mg by mouth daily as needed for mild pain or headache.   Yes Historical Provider, MD  albuterol (PROVENTIL HFA;VENTOLIN HFA) 108 (90 BASE) MCG/ACT inhaler Inhale 2 puffs into the lungs every 6 (six) hours  as needed for wheezing or shortness of breath.    Yes Historical Provider, MD  ibuprofen (ADVIL,MOTRIN) 600 MG tablet Take 1 tablet (600 mg total) by mouth every 6 (six) hours. Patient not taking: Reported on 02/19/2015 10/14/14   Marquette Saa, MD  oxyCODONE (OXY IR/ROXICODONE) 5 MG immediate release tablet Take 1-2 tablets (5-10 mg total) by mouth every 4 (four) hours as needed for moderate  pain. Patient not taking: Reported on 04/25/2015 02/23/15   Rhetta Mura, MD  sulfamethoxazole-trimethoprim (BACTRIM DS,SEPTRA DS) 800-160 MG tablet Take 1 tablet by mouth 2 (two) times daily. Patient not taking: Reported on 04/25/2015 02/23/15   Rhetta Mura, MD   BP 114/60 mmHg  Pulse 94  Temp(Src) 97.8 F (36.6 C) (Oral)  Resp 16  SpO2 100% Physical Exam  Constitutional: She is oriented to person, place, and time. She appears well-developed and well-nourished. No distress.  HENT:  Head: Normocephalic and atraumatic.  Nose: Nose normal.  Mouth/Throat: Oropharynx is clear and moist.  Eyes: Conjunctivae and EOM are normal. Pupils are equal, round, and reactive to light.  Neck: Neck supple.  Cardiovascular: Regular rhythm, normal heart sounds and intact distal pulses.  Tachycardia present.   Pulmonary/Chest: Effort normal and breath sounds normal.  Abdominal: Soft. She exhibits no distension. There is generalized tenderness (worse suprapubically). There is guarding and CVA tenderness (worse on right). There is no rebound and negative Murphy's sign. No hernia. Hernia confirmed negative in the right inguinal area and confirmed negative in the left inguinal area.  Genitourinary: Uterus is tender. Cervix exhibits motion tenderness and discharge. Right adnexum displays tenderness. Left adnexum displays no tenderness. No bleeding in the vagina. Vaginal discharge found.  Musculoskeletal: She exhibits no edema or tenderness.  Neurological: She is alert and oriented to person, place, and time. No cranial nerve deficit. Coordination normal.  Skin: Skin is warm and dry. No rash noted. She is not diaphoretic.  Nursing note and vitals reviewed.   ED Course  Procedures (including critical care time) Labs Review Labs Reviewed  WET PREP, GENITAL - Abnormal; Notable for the following:    WBC, Wet Prep HPF POC MANY (*)    All other components within normal limits  COMPREHENSIVE METABOLIC PANEL  - Abnormal; Notable for the following:    CO2 21 (*)    Glucose, Bld 117 (*)    AST 125 (*)    ALT 142 (*)    All other components within normal limits  CBC - Abnormal; Notable for the following:    WBC 26.9 (*)    All other components within normal limits  URINALYSIS, ROUTINE W REFLEX MICROSCOPIC (NOT AT [facility]) - Abnormal; Notable for the following:    APPearance CLOUDY (*)    Hgb urine dipstick SMALL (*)    Protein, ur 30 (*)    Leukocytes, UA LARGE (*)    All other components within normal limits  URINE MICROSCOPIC-ADD ON - Abnormal; Notable for the following:    Squamous Epithelial / LPF 6-30 (*)    Bacteria, UA FEW (*)    All other components within normal limits  URINE CULTURE  LIPASE, BLOOD  COMPREHENSIVE METABOLIC PANEL  CBC  HEPATITIS PANEL, ACUTE  HIV ANTIBODY (ROUTINE TESTING)  RPR  I-STAT BETA HCG BLOOD, ED ([facility], [facility], [facility] ONLY)  GC/CHLAMYDIA PROBE AMP ([place]) NOT AT [facility]    Imaging Review US Transvaginal Non-ob  04/25/2015  CLINICAL DATA:  Pelvic pain EXAM: TRANSABDOMINAL AND TRANSVAGINAL ULTRASOUND OF PELVIS TECHNIQUE:  Both transabdominal and transvaginal ultrasound examinations of the pelvis were performed. Transabdominal technique was performed for global imaging of the pelvis including uterus, ovaries, adnexal regions, and pelvic cul-de-sac. It was necessary to proceed with endovaginal exam following the transabdominal exam to visualize the ovaries. COMPARISON:  None FINDINGS: Uterus Measurements: 7.6 x 4.1 x 4.8 cm. No fibroids or other mass visualized. Endometrium Thickness: 9 mm.  No focal abnormality visualized. Right ovary Measurements: 4.5 x 2.7 x 2.6 cm. Normal appearance/no adnexal mass. Left ovary Measurements: 4.8 x 1.8 x 2.1 cm. Normal appearance/no adnexal mass. Other findings No abnormal free fluid. IMPRESSION: No acute abnormality noted. Electronically Signed   By: Alcide Clever M.D.   On: 04/25/2015 21:01   US Pelvis Complete  04/25/2015  CLINICAL  DATA:  Pelvic pain EXAM: TRANSABDOMINAL AND TRANSVAGINAL ULTRASOUND OF PELVIS TECHNIQUE: Both transabdominal and transvaginal ultrasound examinations of the pelvis were performed. Transabdominal technique was performed for global imaging of the pelvis including uterus, ovaries, adnexal regions, and pelvic cul-de-sac. It was necessary to proceed with endovaginal exam following the transabdominal exam to visualize the ovaries. COMPARISON:  None FINDINGS: Uterus Measurements: 7.6 x 4.1 x 4.8 cm. No fibroids or other mass visualized. Endometrium Thickness: 9 mm.  No focal abnormality visualized. Right ovary Measurements: 4.5 x 2.7 x 2.6 cm. Normal appearance/no adnexal mass. Left ovary Measurements: 4.8 x 1.8 x 2.1 cm. Normal appearance/no adnexal mass. Other findings No abnormal free fluid. IMPRESSION: No acute abnormality noted. Electronically Signed   By: Alcide Clever M.D.   On: 04/25/2015 21:01   Ct Abdomen Pelvis W Contrast  04/25/2015  CLINICAL DATA:  Generalized abdominal pain and back pain for 2 days EXAM: CT ABDOMEN AND PELVIS WITH CONTRAST TECHNIQUE: Multidetector CT imaging of the abdomen and pelvis was performed using the standard protocol following bolus administration of intravenous contrast. CONTRAST:  80 mL ISOVUE-300 IOPAMIDOL (ISOVUE-300) INJECTION 61% COMPARISON:  None. FINDINGS: Lung bases are free of acute infiltrate or sizable effusion. The liver, gallbladder, spleen, adrenal glands and pancreas are within normal limits. The kidneys demonstrate a normal enhancement pattern bilaterally. No renal calculi or obstructive changes are noted. The appendix is air-filled and within normal limits. The bladder is partially distended. The uterus is within normal limits. Follicular changes are noted within the ovaries bilaterally. Very minimal pericolonic inflammatory changes noted in the distal descending colon and extending into the proximal sigmoid colon. No abscess or perforation is identified. Prominent  vasculature is noted within the pelvis which may represent some mild pelvic varices. No bony abnormality is noted. IMPRESSION: Changes consistent with focal colitis in the descending and sigmoid colon. No other focal abnormality is noted. Electronically Signed   By: Alcide Clever M.D.   On: 04/25/2015 21:33   I have personally reviewed and evaluated these images and lab results as part of my medical decision-making.   EKG Interpretation None      MDM 25 y.o. F presents with progressively worsening lower back pain then developing generalized abdominal pain, nausea, vomiting and diarrhea. She has had associated worsening sensation of urinary urgency and hesitancy. She also notes new vaginal discharge. Physical exam as above, with tachycardia but stable blood pressure, AF, concerning for diffuse abdominal tenderness with mild guarding, worse suprapubically. +CVA tenderness worse on the right. Pelvic exam concerning for PID with +CMT and left adnexal tenderness with significant discharge noted. GC/CT swabs sent. Exam concerning for possible PID/TOA so pelvic US was ordered and returned showing no evidence of acute  pelvic abnormality. Labs returned significant for significant leukocytosis of 26.9, and mild LFT elevation. No hx of prior hepatitis, significant tylenol intake, ETOH or drug abuse. CT abd/plv was done and showed evidence of colitis. UA showed large leuks suggestive of UTI, but neg nitrites. She was given a dose of rocephin for UTI/pyelo. After she was found to have significant pelvic exam findings, she was given a dose of doxycycline as well. Given elevated LFTs concern raised for possible Fitz-hugh-curtis. She remained persistently tachycardic while in the ED despite fluid resuscitation and had significant symptoms requiring multiple doses of pain and nausea medication. The decision was made to admit for obs and further assessment and care. This plan was discussed with the patient and her mother at  the bedside and they stated both understanding and agreement with this plan.     Final diagnoses:  Colitis, acute  Generalized abdominal pain  Vaginal discharge  Pelvic inflammatory disease (PID)  Pyelonephritis       Francoise CeoWarren S Lemma Tetro, DO 04/26/15 0028  Gwyneth SproutWhitney Plunkett, MD 04/28/15 2130

## 2015-04-25 NOTE — ED Notes (Signed)
Pt sts generalized abd pain and back pain x 2 days with some N/V

## 2015-04-25 NOTE — ED Notes (Signed)
Pt still with Radiology

## 2015-04-25 NOTE — H&P (Signed)
Family Medicine Teaching [facility] Admission History and Physical Service Pager: [phone]  Patient name: Monica Munoz Medical record number: [id number] Date of birth: 1990-09-29 Age: 25 y.o. Gender: female  Primary Care Provider: Marshia Ly, PA-C Consultants: None  Code Status: FULL (discussed upon admission)   Chief Complaint: Abdominal Pain  Assessment and Plan: Theresia Pree is a 25 y.o. female presenting with abdominal pain associated with N/V/D and new vaginal discharge . PMH is significant for asthma.   Abdominal Pain: Likely multifactorial. Suspect viral gastroenteritis given presentation and sick contact. Additionally concerned for PID with +vaginal discharge, cervical motion tenderness and adnexal tenderness on pelvic exam, and leukocytosis (WBC  26.9). 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 CT abdomen with mild focal colitis; kidneys, appendix, liver, gallbladder, and pancreas within normal limits. Lipase 32 so doubt pancreatitis as cause. Pregnancy ruled out with bHCG <5. Transvaginal US was without acute abnormality. Most likely PID. Less likely colitis. Colitis unimpressive on CT scan. Will await further test results.  -admit to MedSurg, vital signs per unit  -Dilaudid 0.5 mg q3h for pain control  -Cefoxetin and Doxycycline for probable PID  -CBC in AM  -if not improving, could consider flagyl and cipro to treat for colitis    N/V/D: Likely secondary to viral gastroenteritis, though also consistent with PID. CT abdomen with very minimal pericolonic inflammatory changes in the distal descending colon and proximal sigmoid colon.  -D5 NS at 125 cc/hr  -Zofran 4 mg IV or PO q6h prn  -enteric precautions   Vaginal Discharge: Wet prep without clue cells, yeast, or trichomonas. Many WBCs on wet prep which could be consistent with GC/Chlamydia. Concern for PID. -GC/Chlamydia pending  -HIV and RPR ordered    Tachycardia: Likely secondary to pain. Dehydration could also be contributing.  -D5 NS at 125 cc/hr  -pain control as above  -continue to monitor   Elevated Transaminases: AST elevated to 125 and ALT elevated to 142. Patient denies history of significant EtoH use or tylenol use. 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  -CMET in AM  -hepatitis panel ordered   ?UTI: UA with large LE, negative nitrites, 6-30 squamous cells, too numerous to count WBC and few bacteria. Pyuria may be related to potential STD.  -urine culture ordered (after abx started)  -continue antibiotics as above   Asthma: Respiratory status stable at admission.  -home albuterol 2 puffs q6h prn ordered    FEN/GI: Soft Diet, D5 NS at 125 cc/hr  Prophylaxis: SCDs   Disposition: Admit to FPTS; Attending [facility]   History of Present Illness:  Monica Munoz is a 25 y.o. female presenting with abdominal and back pain. Reports pain started yesterday in the lower abdominal region and across her back and has since worsened. Has significant RUQ pain that is worsened with movement and inspiration.   Reports diarrhea x3-4 days. Has been having multiple loose stools per day. Not sure if she has had blood in her stools. Started having nausea and vomiting yesterday. Has not been able to keep anything down to eat in the past 24 hours. Has only had one cup of water today. Took two tablets of tylenol last night but vomited afterwards.  Unsure if has had a temperature at home, but endorses chills. Reports her daughter has been sick at home.   Admits to frequency of urination with hesitancy as well as associated suprapubic pain. Denies dysuria. Reports vaginal discharge and vaginal discomfort. Has had one  sexual partner in the last 6 months. This partner is new and she has had unprotected sex with the female partner. Denies history of STDs.   In the ED, patient received 2 L NS boluses. Also given morphine IV 4 mg x2 with  inadequate pain control. Ceftriaxone and Doxycycline initiated.   Review Of Systems: Per HPI with the following additions: None  Otherwise the remainder of the systems were negative.  Patient Active Problem List   Diagnosis Date Noted  . Colitis, acute 04/25/2015  . Mastitis 02/19/2015  . Cellulitis of right breast 02/19/2015  . Migraine 02/19/2015  . Leukocytosis   . Other migraine without status migrainosus, not intractable   . NSVD (normal spontaneous vaginal delivery) 10/14/2014  . Preterm uterine contractions in third trimester, antepartum 08/26/2014  . Anemia during pregnancy in third trimester 07/09/2014  . Supervision of normal pregnancy, antepartum 04/16/2014  . Anxiety state, unspecified 05/26/2013  . Asthma, mild intermittent 05/26/2013  . Depression 05/26/2013  . Headache, migraine 01/29/2012    Past Medical History: Past Medical History  Diagnosis Date  . Asthma   . Migraine with aura 04/25/2012  . Depression   . Anxiety     Past Surgical History: Past Surgical History  Procedure Laterality Date  . Wisdom tooth extraction      Social History: Social History  Substance Use Topics  . Smoking status: Never Smoker   . Smokeless tobacco: Never Used  . Alcohol Use: No   Additional social history: Drinks EtOH very occasionally. Not daily drinker. No tobacco or drug use.   Please also refer to relevant sections of EMR.  Family History: Family History  Problem Relation Age of Onset  . Heart murmur Mother   . Miscarriages / [place] Mother   . Diabetes Mother   . Autism Paternal Uncle   . Down syndrome Paternal Uncle   . Stroke Maternal Grandmother   . Diabetes Maternal Grandfather   . Hyperlipidemia Maternal Grandfather   . Stroke Maternal Grandfather     Allergies and Medications: Allergies  Allergen Reactions  . Benadryl [Diphenhydramine Hcl] Other (See Comments)    Red Man's and Sjogren's  . Diphenhydramine Hcl     Other reaction(s):  Redness  . Lorazepam     Patient states that it makes her "loopy" and "off the wall"  . Vancomycin Itching    itching  . Adhesive [Tape] Dermatitis    hypofix tape only - itching   No current facility-administered medications on file prior to encounter.   Current Outpatient Prescriptions on File Prior to Encounter  Medication Sig Dispense Refill  . acetaminophen (TYLENOL) 500 MG tablet Take 1,000 mg by mouth daily as needed for mild pain or headache.    . albuterol (PROVENTIL HFA;VENTOLIN HFA) 108 (90 BASE) MCG/ACT inhaler Inhale 2 puffs into the lungs every 6 (six) hours as needed for wheezing or shortness of breath.     Marland Kitchen. ibuprofen (ADVIL,MOTRIN) 600 MG tablet Take 1 tablet (600 mg total) by mouth every 6 (six) hours. (Patient not taking: Reported on 02/19/2015) 90 tablet 1  . oxyCODONE (OXY IR/ROXICODONE) 5 MG immediate release tablet Take 1-2 tablets (5-10 mg total) by mouth every 4 (four) hours as needed for moderate pain. (Patient not taking: Reported on 04/25/2015) 30 tablet 0  . sulfamethoxazole-trimethoprim (BACTRIM DS,SEPTRA DS) 800-160 MG tablet Take 1 tablet by mouth 2 (two) times daily. (Patient not taking: Reported on 04/25/2015) 14 tablet 0    Objective: BP 107/67 mmHg  Pulse 95  Temp(Src) 98.6 F (37 C) (Oral)  Resp 20  SpO2 97% Exam: General:  Female lying in bed, appears uncomfortable, non-toxic  Eyes: EOMI. Pupils equal and round.  ENTM: Oropharynx clear.  Neck: Full ROM.  Cardiovascular: RRR. No murmurs appreciated. 2+ pedal pulses. No LE edema.  Respiratory: CTAB. Normal WOB.  Abdomen: +BS, soft, non-distended, significant diffuse tenderness but worst in RUQ, voluntary guarding present  MSK: Moves all extremities spontaneously.  Skin: Warm and dry.  Neuro: No focal deficits.  Psych: Appropriate mood and affect.   Labs and Imaging: CBC BMET   Recent Labs Lab 04/25/15 1619  WBC 26.9*  HGB 12.8  HCT 39.2  PLT 313    Recent Labs Lab 04/25/15 1619  NA  136  K 4.2  CL 103  CO2 21*  BUN 9  CREATININE 0.78  GLUCOSE 117*  CALCIUM 9.5     US Transvaginal Non-ob  04/25/2015  CLINICAL DATA:  Pelvic pain EXAM: TRANSABDOMINAL AND TRANSVAGINAL ULTRASOUND OF PELVIS TECHNIQUE: Both transabdominal and transvaginal ultrasound examinations of the pelvis were performed. Transabdominal technique was performed for global imaging of the pelvis including uterus, ovaries, adnexal regions, and pelvic cul-de-sac. It was necessary to proceed with endovaginal exam following the transabdominal exam to visualize the ovaries. COMPARISON:  None FINDINGS: Uterus Measurements: 7.6 x 4.1 x 4.8 cm. No fibroids or other mass visualized. Endometrium Thickness: 9 mm.  No focal abnormality visualized. Right ovary Measurements: 4.5 x 2.7 x 2.6 cm. Normal appearance/no adnexal mass. Left ovary Measurements: 4.8 x 1.8 x 2.1 cm. Normal appearance/no adnexal mass. Other findings No abnormal free fluid. IMPRESSION: No acute abnormality noted. Electronically Signed   By: Alcide Clever M.D.   On: 04/25/2015 21:01   US Pelvis Complete  04/25/2015  CLINICAL DATA:  Pelvic pain EXAM: TRANSABDOMINAL AND TRANSVAGINAL ULTRASOUND OF PELVIS TECHNIQUE: Both transabdominal and transvaginal ultrasound examinations of the pelvis were performed. Transabdominal technique was performed for global imaging of the pelvis including uterus, ovaries, adnexal regions, and pelvic cul-de-sac. It was necessary to proceed with endovaginal exam following the transabdominal exam to visualize the ovaries. COMPARISON:  None FINDINGS: Uterus Measurements: 7.6 x 4.1 x 4.8 cm. No fibroids or other mass visualized. Endometrium Thickness: 9 mm.  No focal abnormality visualized. Right ovary Measurements: 4.5 x 2.7 x 2.6 cm. Normal appearance/no adnexal mass. Left ovary Measurements: 4.8 x 1.8 x 2.1 cm. Normal appearance/no adnexal mass. Other findings No abnormal free fluid. IMPRESSION: No acute abnormality noted. Electronically  Signed   By: Alcide Clever M.D.   On: 04/25/2015 21:01   Ct Abdomen Pelvis W Contrast  04/25/2015  CLINICAL DATA:  Generalized abdominal pain and back pain for 2 days EXAM: CT ABDOMEN AND PELVIS WITH CONTRAST TECHNIQUE: Multidetector CT imaging of the abdomen and pelvis was performed using the standard protocol following bolus administration of intravenous contrast. CONTRAST:  80 mL ISOVUE-300 IOPAMIDOL (ISOVUE-300) INJECTION 61% COMPARISON:  None. FINDINGS: Lung bases are free of acute infiltrate or sizable effusion. The liver, gallbladder, spleen, adrenal glands and pancreas are within normal limits. The kidneys demonstrate a normal enhancement pattern bilaterally. No renal calculi or obstructive changes are noted. The appendix is air-filled and within normal limits. The bladder is partially distended. The uterus is within normal limits. Follicular changes are noted within the ovaries bilaterally. Very minimal pericolonic inflammatory changes noted in the distal descending colon and extending into the proximal sigmoid colon. No abscess or perforation is identified. Prominent vasculature is noted  within the pelvis which may represent some mild pelvic varices. No bony abnormality is noted. IMPRESSION: Changes consistent with focal colitis in the descending and sigmoid colon. No other focal abnormality is noted. Electronically Signed   By: Alcide Clever M.D.   On: 04/25/2015 21:33    Arvilla Market, DO 04/25/2015, 10:57 PM PGY-1, [place] FPTS Intern pager: [phone], text pages welcome  I agree with the above evaluation, assessment, and plan. Any correctional changes can be noted in [facility].   Yolande Jolly, MD Family Medicine Resident - PGY 2

## 2015-04-26 ENCOUNTER — Encounter (HOSPITAL_COMMUNITY): Payer: Self-pay | Admitting: *Deleted

## 2015-04-26 DIAGNOSIS — N730 Acute parametritis and pelvic cellulitis: Secondary | ICD-10-CM

## 2015-04-26 LAB — COMPREHENSIVE METABOLIC PANEL
ALBUMIN: 2.8 g/dL — AB (ref 3.5–5.0)
ALK PHOS: 70 U/L (ref 38–126)
ALT: 78 U/L — AB (ref 14–54)
AST: 45 U/L — AB (ref 15–41)
Anion gap: 9 (ref 5–15)
BILIRUBIN TOTAL: 0.7 mg/dL (ref 0.3–1.2)
BUN: 6 mg/dL (ref 6–20)
CO2: 21 mmol/L — ABNORMAL LOW (ref 22–32)
CREATININE: 0.76 mg/dL (ref 0.44–1.00)
Calcium: 8.4 mg/dL — ABNORMAL LOW (ref 8.9–10.3)
Chloride: 109 mmol/L (ref 101–111)
GFR calc Af Amer: >60 mL/min (ref >60)
GFR calc non Af Amer: >60 mL/min (ref >60)
GLUCOSE: 88 mg/dL (ref 65–99)
POTASSIUM: 3.6 mmol/L (ref 3.5–5.1)
Sodium: 139 mmol/L (ref 135–145)
TOTAL PROTEIN: 5.9 g/dL — AB (ref 6.5–8.1)

## 2015-04-26 LAB — CBC
HEMATOCRIT: 34.5 % — AB (ref 36.0–46.0)
Hemoglobin: 11.3 g/dL — ABNORMAL LOW (ref 12.0–15.0)
MCH: 30.3 pg (ref 26.0–34.0)
MCHC: 32.8 g/dL (ref 30.0–36.0)
MCV: 92.5 fL (ref 78.0–100.0)
Platelets: 234 10*3/uL (ref 150–400)
RBC: 3.73 MIL/uL — ABNORMAL LOW (ref 3.87–5.11)
RDW: 13.4 % (ref 11.5–15.5)
WBC: 19.7 10*3/uL — ABNORMAL HIGH (ref 4.0–10.5)

## 2015-04-26 LAB — GC/CHLAMYDIA PROBE AMP (~~LOC~~) NOT AT ARMC
CHLAMYDIA, DNA PROBE: POSITIVE — AB
Neisseria Gonorrhea: POSITIVE — AB

## 2015-04-26 LAB — RPR: RPR: NONREACTIVE

## 2015-04-26 LAB — HIV ANTIBODY (ROUTINE TESTING W REFLEX): HIV Screen 4th Generation wRfx: NONREACTIVE

## 2015-04-26 MED ORDER — HYDROMORPHONE HCL 1 MG/ML IJ SOLN
1.0000 mg | INTRAMUSCULAR | Status: DC | PRN
  Administered 2015-04-26 – 2015-04-27 (×5): 1 mg via INTRAVENOUS
  Filled 2015-04-26 (×5): qty 1

## 2015-04-26 MED ORDER — ACETAMINOPHEN 325 MG PO TABS
650.0000 mg | ORAL_TABLET | Freq: Three times a day (TID) | ORAL | Status: DC | PRN
  Administered 2015-04-26 – 2015-04-27 (×3): 650 mg via ORAL
  Filled 2015-04-26 (×3): qty 2

## 2015-04-26 MED ORDER — ONDANSETRON HCL 4 MG/2ML IJ SOLN
4.0000 mg | INTRAMUSCULAR | Status: AC
  Administered 2015-04-26: 4 mg via INTRAVENOUS

## 2015-04-26 MED ORDER — HYDROMORPHONE HCL 1 MG/ML IJ SOLN
1.0000 mg | INTRAMUSCULAR | Status: AC
  Administered 2015-04-26: 1 mg via INTRAVENOUS

## 2015-04-26 MED ORDER — ACETAMINOPHEN 325 MG PO TABS
650.0000 mg | ORAL_TABLET | Freq: Four times a day (QID) | ORAL | Status: DC | PRN
  Administered 2015-04-26: 650 mg via ORAL
  Filled 2015-04-26: qty 2

## 2015-04-26 MED ORDER — ZOLPIDEM TARTRATE 5 MG PO TABS
5.0000 mg | ORAL_TABLET | Freq: Every evening | ORAL | Status: DC | PRN
  Administered 2015-04-26 – 2015-04-29 (×2): 5 mg via ORAL
  Filled 2015-04-26 (×2): qty 1

## 2015-04-26 NOTE — Care Management Note (Signed)
Case Management Note  Patient Details  Name: Monica Munoz MRN: [id number] Date of Birth: 1990/06/11  Subjective/Objective:                 Independent patient from home admitted with abd pain. Differential diagnosis pending. IV Abx.    Action/Plan:  CM will continue to follow Expected Discharge Date:                  Expected Discharge Plan:  Home/Self Care  In-House Referral:     Discharge planning Services  CM Consult  Post Acute Care Choice:    Choice offered to:     DME Arranged:    DME Agency:     HH Arranged:    HH Agency:     Status of Service:  In process, will continue to follow  Medicare Important Message Given:    Date Medicare IM Given:    Medicare IM give by:    Date Additional Medicare IM Given:    Additional Medicare Important Message give by:     If discussed at Long Length of Stay Meetings, dates discussed:    Additional Comments:  Monica SabalDebbie Karianna Gusman, RN 04/26/2015, 11:24 AM

## 2015-04-26 NOTE — Progress Notes (Signed)
Family Medicine Teaching Service Daily Progress Note Intern Pager: [phone]  Patient name: Monica Munoz Medical record number: [id number] Date of birth: 1990/10/22 Age: 25 y.o. Gender: female  Primary Care Provider: Marshia Ly, PA-C Consultants: None  Code Status: FULL   Pt Overview and Major Events to Date:  4/9: Patient admitted with abdominal pain, N//V/D  Assessment and Plan: Monica Munoz is a 25 y.o. female presenting with abdominal pain associated with N/V/D and new vaginal discharge . PMH is significant for asthma.   Abdominal Pain: Likely multifactorial. Viral gastroenteritis vs acute colitis of infectious or inflammatory origin.  Additionally concerned for PID with +vaginal discharge, cervical motion tenderness and adnexal tenderness on pelvic exam, and leukocytosis (WBC 26.9). 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 CT abdomen with mild focal colitis; kidneys, appendix, liver, gallbladder, and pancreas within normal limits. Lipase 32 so doubt pancreatitis as cause. Pregnancy ruled out with bHCG <5. Transvaginal US was without acute abnormality. Most likely PID. Less likely colitis. Colitis unimpressive on CT scan. Will await further test results.  -Dilaudid 0.5 mg q3h for pain control  -Cefoxetin and Doxycycline for probable PID  -CBC in AM: leukocytosis improved to 19.7  -fecal lactoferrin and FOBT ordered  -if not improving, could consider flagyl and cipro to treat for colitis    N/V/D: Likely secondary to viral gastroenteritis, though also consistent with PID. CT abdomen with very minimal pericolonic inflammatory changes in the distal descending colon and proximal sigmoid colon.  -D5 NS at 125 cc/hr  -Zofran 4 mg IV or PO q6h prn  -enteric precautions   Vaginal Discharge: Wet prep without clue cells, yeast, or trichomonas. Many WBCs on wet prep which could be consistent with GC/Chlamydia. Concern for  PID. -GC/Chlamydia pending  -HIV and RPR ordered   Tachycardia, Improving: Likely secondary to pain. Dehydration could also be contributing.  -D5 NS at 125 cc/hr  -pain control as above  -continue to monitor   Elevated Transaminases: AST elevated to 125 and ALT elevated to 142. Patient denies history of significant EtoH use or tylenol use. 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  -CMET in AM: AST 45 and ALT 78  -hepatitis panel ordered   ?UTI: UA with large LE, negative nitrites, 6-30 squamous cells, too numerous to count WBC and few bacteria. Pyuria may be related to potential STD.  -urine culture ordered (after abx started)  -continue antibiotics as above   Asthma: Respiratory status stable at admission.  -home albuterol 2 puffs q6h prn ordered    FEN/GI: Soft Diet, D5 NS at 125 cc/hr  Prophylaxis: SCDs   Disposition: Pending clinical improvement   Subjective:  Still with abdominal pain this morning. Feels like it is worst in the RUQ. +Nausea but no emesis. No loose stools since admission.   Objective: Temp:  [97.8 F (36.6 C)-98.8 F (37.1 C)] 98.8 F (37.1 C) (04/10 0529) Pulse Rate:  [73-129] 73 (04/10 0529) Resp:  [16-20] 18 (04/10 0529) BP: (97-122)/(56-80) 112/72 mmHg (04/10 0529) SpO2:  [95 %-100 %] 99 % (04/10 0529) Weight:  [155 lb 3.3 oz (70.4 kg)] 155 lb 3.3 oz (70.4 kg) (04/10 0005) Physical Exam: General: female lying in bed, non-toxic, appears uncomfortable  Cardiovascular: RRR. No murmurs appreciated.  Respiratory: CTAB. Normal WOB.  Abdomen: +BS, soft, non-distended, diffuse tenderness worst in RUQ, voluntary guarding present  Extremities: No LE edema   Laboratory:  Recent Labs Lab 04/25/15 1619 04/26/15 0525  WBC 26.9* 19.7*  HGB 12.8 11.3*  HCT 39.2 34.5*  PLT 313 234    Recent Labs Lab 04/25/15 1619 04/26/15 0525  NA 136 139  K 4.2 3.6  CL 103 109  CO2 21* 21*  BUN 9 6  CREATININE 0.78 0.76  CALCIUM 9.5  8.4*  PROT 7.4 5.9*  BILITOT 1.0 0.7  ALKPHOS 87 70  ALT 142* 78*  AST 125* 45*  GLUCOSE 117* 88     Imaging/Diagnostic Tests: [place] Transvaginal Non-ob  04/25/2015 CLINICAL DATA: Pelvic pain EXAM: TRANSABDOMINAL AND TRANSVAGINAL ULTRASOUND OF PELVIS TECHNIQUE: Both transabdominal and transvaginal ultrasound examinations of the pelvis were performed. Transabdominal technique was performed for global imaging of the pelvis including uterus, ovaries, adnexal regions, and pelvic cul-de-sac. It was necessary to proceed with endovaginal exam following the transabdominal exam to visualize the ovaries. COMPARISON: None FINDINGS: Uterus Measurements: 7.6 x 4.1 x 4.8 cm. No fibroids or other mass visualized. Endometrium Thickness: 9 mm. No focal abnormality visualized. Right ovary Measurements: 4.5 x 2.7 x 2.6 cm. Normal appearance/no adnexal mass. Left ovary Measurements: 4.8 x 1.8 x 2.1 cm. Normal appearance/no adnexal mass. Other findings No abnormal free fluid. IMPRESSION: No acute abnormality noted. Electronically Signed By: Alcide CleverMark Lukens M.D. On: 04/25/2015 21:01   [place] Pelvis Complete  04/25/2015 CLINICAL DATA: Pelvic pain EXAM: TRANSABDOMINAL AND TRANSVAGINAL ULTRASOUND OF PELVIS TECHNIQUE: Both transabdominal and transvaginal ultrasound examinations of the pelvis were performed. Transabdominal technique was performed for global imaging of the pelvis including uterus, ovaries, adnexal regions, and pelvic cul-de-sac. It was necessary to proceed with endovaginal exam following the transabdominal exam to visualize the ovaries. COMPARISON: None FINDINGS: Uterus Measurements: 7.6 x 4.1 x 4.8 cm. No fibroids or other mass visualized. Endometrium Thickness: 9 mm. No focal abnormality visualized. Right ovary Measurements: 4.5 x 2.7 x 2.6 cm. Normal appearance/no adnexal mass. Left ovary Measurements: 4.8 x 1.8 x 2.1 cm. Normal appearance/no adnexal mass. Other findings No abnormal free fluid. IMPRESSION: No  acute abnormality noted. Electronically Signed By: Alcide CleverMark Lukens M.D. On: 04/25/2015 21:01   Ct Abdomen Pelvis W Contrast  04/25/2015 CLINICAL DATA: Generalized abdominal pain and back pain for 2 days EXAM: CT ABDOMEN AND PELVIS WITH CONTRAST TECHNIQUE: Multidetector CT imaging of the abdomen and pelvis was performed using the standard protocol following bolus administration of intravenous contrast. CONTRAST: 80 mL ISOVUE-300 IOPAMIDOL (ISOVUE-300) INJECTION 61% COMPARISON: None. FINDINGS: Lung bases are free of acute infiltrate or sizable effusion. The liver, gallbladder, spleen, adrenal glands and pancreas are within normal limits. The kidneys demonstrate a normal enhancement pattern bilaterally. No renal calculi or obstructive changes are noted. The appendix is air-filled and within normal limits. The bladder is partially distended. The uterus is within normal limits. Follicular changes are noted within the ovaries bilaterally. Very minimal pericolonic inflammatory changes noted in the distal descending colon and extending into the proximal sigmoid colon. No abscess or perforation is identified. Prominent vasculature is noted within the pelvis which may represent some mild pelvic varices. No bony abnormality is noted. IMPRESSION: Changes consistent with focal colitis in the descending and sigmoid colon. No other focal abnormality is noted. Electronically Signed  By: Alcide CleverMark Lukens M.D. On: 04/25/2015 21:33           Monica Marketatherine Lauren Jarissa Sheriff, DO 04/26/2015, 7:05 AM PGY-1, [facility] FPTS Intern pager: [phone], text pages welcome

## 2015-04-27 LAB — CBC
HCT: 37.6 % (ref 36.0–46.0)
Hemoglobin: 12.1 g/dL (ref 12.0–15.0)
MCH: 29.6 pg (ref 26.0–34.0)
MCHC: 32.2 g/dL (ref 30.0–36.0)
MCV: 91.9 fL (ref 78.0–100.0)
Platelets: 258 10*3/uL (ref 150–400)
RBC: 4.09 MIL/uL (ref 3.87–5.11)
RDW: 13 % (ref 11.5–15.5)
WBC: 8.2 10*3/uL (ref 4.0–10.5)

## 2015-04-27 LAB — BASIC METABOLIC PANEL
Anion gap: 11 (ref 5–15)
CHLORIDE: 104 mmol/L (ref 101–111)
CO2: 24 mmol/L (ref 22–32)
CREATININE: 0.82 mg/dL (ref 0.44–1.00)
Calcium: 9 mg/dL (ref 8.9–10.3)
GFR calc Af Amer: >60 mL/min (ref >60)
GFR calc non Af Amer: >60 mL/min (ref >60)
GLUCOSE: 95 mg/dL (ref 65–99)
POTASSIUM: 3.8 mmol/L (ref 3.5–5.1)
Sodium: 139 mmol/L (ref 135–145)

## 2015-04-27 LAB — URINE CULTURE

## 2015-04-27 LAB — HEPATIC FUNCTION PANEL
ALK PHOS: 71 U/L (ref 38–126)
ALT: 62 U/L — AB (ref 14–54)
AST: 29 U/L (ref 15–41)
Albumin: 3 g/dL — ABNORMAL LOW (ref 3.5–5.0)
BILIRUBIN TOTAL: 0.8 mg/dL (ref 0.3–1.2)
TOTAL PROTEIN: 6.5 g/dL (ref 6.5–8.1)

## 2015-04-27 LAB — HEPATITIS PANEL, ACUTE
HEP B S AG: NEGATIVE
Hep A IgM: NEGATIVE
Hep B C IgM: NEGATIVE

## 2015-04-27 MED ORDER — OXYCODONE HCL 5 MG PO TABS
5.0000 mg | ORAL_TABLET | Freq: Four times a day (QID) | ORAL | Status: DC | PRN
  Administered 2015-04-27 – 2015-04-28 (×4): 10 mg via ORAL
  Filled 2015-04-27: qty 1
  Filled 2015-04-27 (×3): qty 2
  Filled 2015-04-27: qty 1

## 2015-04-27 MED ORDER — PROMETHAZINE HCL 25 MG PO TABS
12.5000 mg | ORAL_TABLET | Freq: Once | ORAL | Status: AC
  Administered 2015-04-27: 12.5 mg via ORAL
  Filled 2015-04-27: qty 1

## 2015-04-27 MED ORDER — POLYETHYLENE GLYCOL 3350 17 G PO PACK
17.0000 g | PACK | Freq: Every day | ORAL | Status: DC
  Administered 2015-04-27 – 2015-04-28 (×2): 17 g via ORAL
  Filled 2015-04-27 (×2): qty 1

## 2015-04-27 MED ORDER — DOXYCYCLINE HYCLATE 100 MG PO TABS
100.0000 mg | ORAL_TABLET | Freq: Two times a day (BID) | ORAL | Status: DC
  Administered 2015-04-27 – 2015-04-29 (×4): 100 mg via ORAL
  Filled 2015-04-27 (×4): qty 1

## 2015-04-27 MED ORDER — SENNOSIDES-DOCUSATE SODIUM 8.6-50 MG PO TABS: 1.0000 | ORAL_TABLET | Freq: Every evening | ORAL | Status: DC | PRN

## 2015-04-27 MED ORDER — METRONIDAZOLE 500 MG PO TABS
500.0000 mg | ORAL_TABLET | Freq: Two times a day (BID) | ORAL | Status: DC
  Administered 2015-04-27 – 2015-04-29 (×4): 500 mg via ORAL
  Filled 2015-04-27 (×5): qty 1

## 2015-04-27 MED ORDER — DEXTROSE-NACL 5-0.45 % IV SOLN
INTRAVENOUS | Status: DC
  Administered 2015-04-27 – 2015-04-29 (×5): via INTRAVENOUS

## 2015-04-27 NOTE — Progress Notes (Signed)
(+)  gonorrhea and chlamydia results to patient placement office. Currently an inpatient at [facility] and being treated per MD protocol.   DHHS formed faxed per protocol.

## 2015-04-27 NOTE — Discharge Summary (Signed)
[facility]  Patient name: Monica Munoz Medical record number: [id number] Date of birth: 1990-07-27 Age: 25 y.o. Gender: female Date of Admission: 04/25/2015  Date of Discharge: 04/29/2015 Admitting Physician: Leighton Roachodd D McDiarmid, MD  Primary Care Provider: Marshia LyMICHAELS,CHASE A, PA-C Consultants: None   Indication for Hospitalization: PID   Discharge Diagnoses/Problem List:  Patient Active Problem List   Diagnosis Date Noted  . Colitis, acute 04/25/2015  . Pelvic inflammatory disease 04/25/2015  . 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 Tachycardia 04/25/2015  . Abdominal pain 04/25/2015  . Acute PID (pelvic inflammatory disease) 04/25/2015  . Pelvic pain in female   . Vaginal discharge   . Mastitis 02/19/2015  . Cellulitis of right breast 02/19/2015  . Migraine 02/19/2015  . Leukocytosis   . Other migraine without status migrainosus, not intractable   . NSVD (normal spontaneous vaginal delivery) 10/14/2014  . Preterm uterine contractions in third trimester, antepartum 08/26/2014  . Anemia during pregnancy in third trimester 07/09/2014  . Supervision of normal pregnancy, antepartum 04/16/2014  . Anxiety state, unspecified 05/26/2013  . Asthma, mild intermittent 05/26/2013  . Depression 05/26/2013  . Headache, migraine 01/29/2012    Disposition: Home   Discharge Condition: Improved   Discharge Exam:  General: female lying in bed, non-toxic,NAD  Cardiovascular: RRR. No murmurs appreciated.  Respiratory: CTAB. Normal WOB.  Abdomen: +BS, soft, non-distended, mild tenderness worst in RUQ but no facial grimacing, voluntary guarding present  Extremities: No LE edema   Brief Hospital Course:  Monica Dunkori Haws is 25 y.o. female with PMH of asthma who presented with abdominal pain, new vaginal discharge, and nausea, vomiting and diarrhea. Pelvic exam performed in ED was positive for vaginal discharge, cervical motion tenderness and right  adnexal tenderness. Wet prep only positive for many WBCs, concerning for potential gonorrhea or chlamydia infection. CT abdomen was obtained. Showed minimal left pericolonic inflammatory changes, all other organs grossly normal. Patient had leukocytosis and elevated transaminases at presentation. She was started on IVF, anti-emetics, and IV pain medications.   PID: Patient started on Cefotan and Doxycycline for suspected PID given laboratory findings, pelvic exam findings, and history of recent unprotected sexual intercourse. Gonorrhea and Chlamydia resulted as positive. Patient began to appear better clinically. Leukocytosis and tachycardia resolved. Patient was switched to oral Doxycycline and Flagyl was added for anaerobic coverage. Deescalated to PO Oxycodone. Patient continued to complain of abdominal pain and nausea, without emesis. Began to have constipation likely secondary to narcotics and given overall good clinical appearance, was switched to Tylenol and Motrin for pain control to avoid side effects of bowel motility. She had good PO fluid intake prior to discharge.   Abdominal Pain: Initially with diarrhea. Given CT findings for focal colitis, wished to investigate further. FOBT and fecal lactoferrin were ordered. However, these were never able to be collected as patient did not have any further diarrhea during hospitalization. Suspect was a transient viral gastroenteritis or a complication of the PID. UA showed significant pyuria (potentially related to GC/Chlamydia) as was a dirty catch with 6-30 squamous epithelial cells and was nitrite negative. Urine was cultured and showed multiple species. Doubt UTI so was not given further treatment.   Elevated Transaminases: Likely peri-hepatitis related to PID as LFTs improved with treatment. Hepatitis panel was negative. Patient denied significant history of EtoH or tylenol use.   Issues for Follow Up:  1. Will need HIV and RPR repeated at 6 months  after hospitalization.  2. Will need TOC for Gonorrhea  and Chlamydia. 3. Recommend Gardasil series in this patient if she has not already received it.  4. Reinforcement of safe sex practices. Her partners need to be tested and treated if necessary.  5. Take Doxycyline through 4/23 and Flagyl through 4/25 to complete 14 day course of each antibiotic.  6. Consider repeat CMET to ensure LFTs normalized.   Significant Procedures: None   Significant Labs and Imaging:   Recent Labs Lab 04/25/15 1619 04/26/15 0525 04/27/15 0459  WBC 26.9* 19.7* 8.2  HGB 12.8 11.3* 12.1  HCT 39.2 34.5* 37.6  PLT 313 234 258    Recent Labs Lab 04/25/15 1619 04/26/15 0525 04/27/15 0459  NA 136 139 139  K 4.2 3.6 3.8  CL 103 109 104  CO2 21* 21* 24  GLUCOSE 117* 88 95  BUN 9 6 <5*  CREATININE 0.78 0.76 0.82  CALCIUM 9.5 8.4* 9.0  ALKPHOS 87 70 71  AST 125* 45* 29  ALT 142* 78* 62*  ALBUMIN 3.7 2.8* 3.0*    Gonorrhea + Chlamydia +   AST: 125 >> 45 >> 29 ALT: 142 >> 78 >> 62     Results/Tests Pending at Time of Discharge: None   Discharge Medications:    Medication List    ASK your doctor about these medications        acetaminophen 500 MG tablet  Commonly known as:  TYLENOL  Take 1,000 mg by mouth daily as needed for mild pain or headache.     albuterol 108 (90 Base) MCG/ACT inhaler  Commonly known as:  PROVENTIL HFA;VENTOLIN HFA  Inhale 2 puffs into the lungs every 6 (six) hours as needed for wheezing or shortness of breath.     ibuprofen 600 MG tablet  Commonly known as:  ADVIL,MOTRIN  Take 1 tablet (600 mg total) by mouth every 6 (six) hours.     oxyCODONE 5 MG immediate release tablet  Commonly known as:  Oxy IR/ROXICODONE  Take 1-2 tablets (5-10 mg total) by mouth every 4 (four) hours as needed for moderate pain.     sulfamethoxazole-trimethoprim 800-160 MG tablet  Commonly known as:  BACTRIM DS,SEPTRA DS  Take 1 tablet by mouth 2 (two) times daily.         Discharge Instructions: Please refer to Patient Instructions section of EMR for full details.  Patient was counseled important signs and symptoms that should prompt return to medical care, changes in medications, dietary instructions, activity restrictions, and follow up appointments.   Follow-Up Appointments: Follow-up Information    Schedule an appointment as soon as possible for a visit with MICHAELS,CHASE A, PA-C.   Specialty:  General Practice   Why:  For Hospital Followup   Contact information:   [address] [place] [address] [phone]       Arvilla Market, DO 04/28/2015, 12:02 PM PGY-1, [facility]

## 2015-04-27 NOTE — Progress Notes (Signed)
Family Medicine Teaching Service Daily Progress Note Intern Pager: [phone]  Patient name: Monica Munoz Medical record number: [id number] Date of birth: November 26, 1990 Age: 25 y.o. Gender: female  Primary Care Provider: Marshia Ly, PA-C Consultants: None  Code Status: FULL   Pt Overview and Major Events to Date:  4/9: Patient admitted with abdominal pain, N//V/D  Assessment and Plan: Danialle Dement is a 25 y.o. female presenting with abdominal pain associated with N/V/D and new vaginal discharge . PMH is significant for asthma.   Abdominal Pain: Potentially multifactorial. PID is leading differential given +GC/Chlamydia and cervical motion tenderness on presentation. Viral gastroenteritis vs acute colitis of infectious or inflammatory origin. However, patient without any further diarrhea since admission.  -Dilaudid 1 mg q4h >> has received 5 mg in 24 hours, consider transitioning to Oxycodone IR 10 mg q4h prn  -Cefoxetin and Doxycycline for PID >> consider transitioning to PO doxycycline as patient has been afebrile since admission  -CBC in AM: leukocytosis resolved to 8.2  -fecal lactoferrin and FOBT ordered >>unable to collect    N/V/D: Likely secondary to viral gastroenteritis, though also consistent with PID. CT abdomen with very minimal pericolonic inflammatory changes in the distal descending colon and proximal sigmoid colon.  -D5 NS at 125 cc/hr  -Zofran 4 mg IV or PO q6h prn  -enteric precautions   Vaginal Discharge: Wet prep without clue cells, yeast, or trichomonas. Many WBCs on wet prep and +GC/Chlamydia. HIV and RPR negative.  -antibiotics as above   Tachycardia, Resolved: Was likely 2/2 to dehydration and pain at presentation.  -pain control as above  -continue to monitor   Elevated Transaminases: AST elevated to 125 and ALT elevated to 142. Improved to 45 and 78 respectively. Peri-hepatitis from PID leading differential cause. Hepatitis panel negative.    ?UTI: UA  with large LE, negative nitrites, 6-30 squamous cells, too numerous to count WBC and few bacteria. Pyuria may be related to potential STD.  -urine culture ordered (after abx started)  -continue antibiotics as above   Asthma: Respiratory status stable at admission.  -home albuterol 2 puffs q6h prn ordered    FEN/GI: Soft Diet, D5 NS at 125 cc/hr  Prophylaxis: SCDs   Disposition: Pending clinical improvement   Subjective:  Still with abdominal pain this morning. Feels like it is worst in the RUQ. +Nausea but no emesis. No loose stools since admission.   Objective: Temp:  [97.5 F (36.4 C)-97.9 F (36.6 C)] 97.9 F (36.6 C) (04/11 0800) Pulse Rate:  [53-89] 89 (04/11 0800) Resp:  [17-18] 17 (04/11 0520) BP: (114-130)/(58-82) 114/58 mmHg (04/11 0800) SpO2:  [98 %-99 %] 98 % (04/11 0800) Physical Exam: General: female lying in bed, non-toxic, appears uncomfortable  Cardiovascular: RRR. No murmurs appreciated.  Respiratory: CTAB. Normal WOB.  Abdomen: +BS, soft, non-distended, diffuse tenderness worst in RUQ, voluntary guarding present  Extremities: No LE edema   Laboratory:  Recent Labs Lab 04/25/15 1619 04/26/15 0525 04/27/15 0459  WBC 26.9* 19.7* 8.2  HGB 12.8 11.3* 12.1  HCT 39.2 34.5* 37.6  PLT 313 234 258    Recent Labs Lab 04/25/15 1619 04/26/15 0525 04/27/15 0459  NA 136 139 139  K 4.2 3.6 3.8  CL 103 109 104  CO2 21* 21* 24  BUN 9 6 <5*  CREATININE 0.78 0.76 0.82  CALCIUM 9.5 8.4* 9.0  PROT 7.4 5.9*  --   BILITOT 1.0 0.7  --   ALKPHOS 87 70  --   ALT 142* 78*  --  AST 125* 45*  --   GLUCOSE 117* 88 95     Imaging/Diagnostic Tests: [place] Transvaginal Non-ob  04/25/2015 CLINICAL DATA: Pelvic pain EXAM: TRANSABDOMINAL AND TRANSVAGINAL ULTRASOUND OF PELVIS TECHNIQUE: Both transabdominal and transvaginal ultrasound examinations of the pelvis were performed. Transabdominal technique was performed for global imaging of the pelvis including uterus,  ovaries, adnexal regions, and pelvic cul-de-sac. It was necessary to proceed with endovaginal exam following the transabdominal exam to visualize the ovaries. COMPARISON: None FINDINGS: Uterus Measurements: 7.6 x 4.1 x 4.8 cm. No fibroids or other mass visualized. Endometrium Thickness: 9 mm. No focal abnormality visualized. Right ovary Measurements: 4.5 x 2.7 x 2.6 cm. Normal appearance/no adnexal mass. Left ovary Measurements: 4.8 x 1.8 x 2.1 cm. Normal appearance/no adnexal mass. Other findings No abnormal free fluid. IMPRESSION: No acute abnormality noted. Electronically Signed By: Alcide CleverMark Lukens M.D. On: 04/25/2015 21:01   [place] Pelvis Complete  04/25/2015 CLINICAL DATA: Pelvic pain EXAM: TRANSABDOMINAL AND TRANSVAGINAL ULTRASOUND OF PELVIS TECHNIQUE: Both transabdominal and transvaginal ultrasound examinations of the pelvis were performed. Transabdominal technique was performed for global imaging of the pelvis including uterus, ovaries, adnexal regions, and pelvic cul-de-sac. It was necessary to proceed with endovaginal exam following the transabdominal exam to visualize the ovaries. COMPARISON: None FINDINGS: Uterus Measurements: 7.6 x 4.1 x 4.8 cm. No fibroids or other mass visualized. Endometrium Thickness: 9 mm. No focal abnormality visualized. Right ovary Measurements: 4.5 x 2.7 x 2.6 cm. Normal appearance/no adnexal mass. Left ovary Measurements: 4.8 x 1.8 x 2.1 cm. Normal appearance/no adnexal mass. Other findings No abnormal free fluid. IMPRESSION: No acute abnormality noted. Electronically Signed By: Alcide CleverMark Lukens M.D. On: 04/25/2015 21:01   Ct Abdomen Pelvis W Contrast  04/25/2015 CLINICAL DATA: Generalized abdominal pain and back pain for 2 days EXAM: CT ABDOMEN AND PELVIS WITH CONTRAST TECHNIQUE: Multidetector CT imaging of the abdomen and pelvis was performed using the standard protocol following bolus administration of intravenous contrast. CONTRAST: 80 mL ISOVUE-300 IOPAMIDOL  (ISOVUE-300) INJECTION 61% COMPARISON: None. FINDINGS: Lung bases are free of acute infiltrate or sizable effusion. The liver, gallbladder, spleen, adrenal glands and pancreas are within normal limits. The kidneys demonstrate a normal enhancement pattern bilaterally. No renal calculi or obstructive changes are noted. The appendix is air-filled and within normal limits. The bladder is partially distended. The uterus is within normal limits. Follicular changes are noted within the ovaries bilaterally. Very minimal pericolonic inflammatory changes noted in the distal descending colon and extending into the proximal sigmoid colon. No abscess or perforation is identified. Prominent vasculature is noted within the pelvis which may represent some mild pelvic varices. No bony abnormality is noted. IMPRESSION: Changes consistent with focal colitis in the descending and sigmoid colon. No other focal abnormality is noted. Electronically Signed  By: Alcide CleverMark Lukens M.D. On: 04/25/2015 21:33           Arvilla Marketatherine Lauren Chrystal Zeimet, DO 04/27/2015, 8:59 AM PGY-1, [place] [facility] FPTS Intern pager: [phone], text pages welcome

## 2015-04-28 MED ORDER — IBUPROFEN 400 MG PO TABS
800.0000 mg | ORAL_TABLET | ORAL | Status: DC | PRN
  Administered 2015-04-28 (×2): 800 mg via ORAL
  Filled 2015-04-28 (×3): qty 2

## 2015-04-28 MED ORDER — ACETAMINOPHEN 325 MG PO TABS: 650.0000 mg | ORAL_TABLET | Freq: Four times a day (QID) | ORAL | Status: DC | PRN

## 2015-04-28 MED ORDER — PROMETHAZINE HCL 25 MG PO TABS
12.5000 mg | ORAL_TABLET | Freq: Four times a day (QID) | ORAL | Status: DC | PRN
  Administered 2015-04-29: 12.5 mg via ORAL
  Filled 2015-04-28: qty 1

## 2015-04-28 NOTE — Progress Notes (Signed)
Family Medicine Teaching Service Daily Progress Note Intern Pager: [phone]  Patient name: Monica Munoz Medical record number: [id number] Date of birth: [birth year] Age: 25 y.o. Gender: female  Primary Care Provider: Marshia Ly, PA-C Consultants: None  Code Status: FULL   Pt Overview and Major Events to Date:  4/9: Patient admitted with abdominal pain, N//V/D, Cefotan and Doxycycline started for probable PID  4/10: +Gonorrhea and Chlamydia  4/11: Antibiotics transitioned to PO doxycycline and Flagyl, transitioned to PO pain medications   Assessment and Plan: Monica Munoz is a 25 y.o. female presenting with abdominal pain associated with N/V/D and new vaginal discharge . PMH is significant for asthma.   Abdominal Pain: Potentially multifactorial. PID is leading differential given +GC/Chlamydia and cervical motion tenderness on presentation. Viral gastroenteritis vs acute colitis of infectious or inflammatory origin. However, patient without any further diarrhea since admission. Leukocytosis has resolved and patient afebrile.  -Oxycodone 5-10 mg q6h >> d/c secondary to clinical improvement and constipation, start Motrin 800 mg q4h prn  -Tylenol 650 q6h prn  -continue Doxycyline and Flagyl  -fecal lactoferrin and FOBT ordered >>unable to collect   N/V/D: Likely secondary to viral gastroenteritis, though also consistent with PID. CT abdomen with very minimal pericolonic inflammatory changes in the distal descending colon and proximal sigmoid colon.  -Zofran 4 mg IV or PO q6h prn  -enteric precautions   Vaginal Discharge: Wet prep without clue cells, yeast, or trichomonas. Many WBCs on wet prep and +GC/Chlamydia. HIV and RPR negative.  -antibiotics as above   Constipation: Has not had a bowel movement since prior to admission.  -continue Senna and Miralax  -consider suppository   Tachycardia, Resolved: Was likely 2/2 to dehydration and pain at presentation.  -pain control as above   -continue to monitor   Elevated Transaminases, Improving: AST now 29 and ALT 62. Peri-hepatitis from PID leading differential cause. Hepatitis panel negative.   ?UTI: UA with large LE, negative nitrites, 6-30 squamous cells, too numerous to count WBC and few bacteria. Pyuria may be related to potential STD.  -urine culture ordered: multiple species present  -will not recollect urine   Asthma: Respiratory status stable at admission.  -home albuterol 2 puffs q6h prn ordered    FEN/GI: Soft Diet, SLIV  Prophylaxis: SCDs   Disposition: Pending clinical improvement   Subjective:  States her pain is 8/10 this morning and complains about not getting pain medication in time. +Nausea but no emesis. Says she can't tolerate drinking anything PO including the apple juice with Miralax.   Objective: Temp:  [97.6 F (36.4 C)-98.2 F (36.8 C)] 97.8 F (36.6 C) (04/12 0603) Pulse Rate:  [59-72] 72 (04/12 0603) Resp:  [16-18] 18 (04/12 0603) BP: (99-114)/(56-79) 99/56 mmHg (04/12 0603) SpO2:  [97 %-99 %] 97 % (04/12 0603) Physical Exam: General: female lying in bed, non-toxic,NAD  Cardiovascular: RRR. No murmurs appreciated.  Respiratory: CTAB. Normal WOB.  Abdomen: +BS, soft, non-distended, mild tenderness worst in RUQ but no facial grimacing, voluntary guarding present  Extremities: No LE edema   Laboratory:  Recent Labs Lab 04/25/15 1619 04/26/15 0525 04/27/15 0459  WBC 26.9* 19.7* 8.2  HGB 12.8 11.3* 12.1  HCT 39.2 34.5* 37.6  PLT 313 234 258    Recent Labs Lab 04/25/15 1619 04/26/15 0525 04/27/15 0459  NA 136 139 139  K 4.2 3.6 3.8  CL 103 109 104  CO2 21* 21* 24  BUN 9 6 <5*  CREATININE 0.78 0.76 0.82  CALCIUM 9.5 8.4*  9.0  PROT 7.4 5.9* 6.5  BILITOT 1.0 0.7 0.8  ALKPHOS 87 70 71  ALT 142* 78* 62*  AST 125* 45* 29  GLUCOSE 117* 88 95     Imaging/Diagnostic Tests: [place] Transvaginal Non-ob  04/25/2015 CLINICAL DATA: Pelvic pain EXAM: TRANSABDOMINAL AND  TRANSVAGINAL ULTRASOUND OF PELVIS TECHNIQUE: Both transabdominal and transvaginal ultrasound examinations of the pelvis were performed. Transabdominal technique was performed for global imaging of the pelvis including uterus, ovaries, adnexal regions, and pelvic cul-de-sac. It was necessary to proceed with endovaginal exam following the transabdominal exam to visualize the ovaries. COMPARISON: None FINDINGS: Uterus Measurements: 7.6 x 4.1 x 4.8 cm. No fibroids or other mass visualized. Endometrium Thickness: 9 mm. No focal abnormality visualized. Right ovary Measurements: 4.5 x 2.7 x 2.6 cm. Normal appearance/no adnexal mass. Left ovary Measurements: 4.8 x 1.8 x 2.1 cm. Normal appearance/no adnexal mass. Other findings No abnormal free fluid. IMPRESSION: No acute abnormality noted. Electronically Signed By: Alcide CleverMark Lukens M.D. On: 04/25/2015 21:01   [place] Pelvis Complete  04/25/2015 CLINICAL DATA: Pelvic pain EXAM: TRANSABDOMINAL AND TRANSVAGINAL ULTRASOUND OF PELVIS TECHNIQUE: Both transabdominal and transvaginal ultrasound examinations of the pelvis were performed. Transabdominal technique was performed for global imaging of the pelvis including uterus, ovaries, adnexal regions, and pelvic cul-de-sac. It was necessary to proceed with endovaginal exam following the transabdominal exam to visualize the ovaries. COMPARISON: None FINDINGS: Uterus Measurements: 7.6 x 4.1 x 4.8 cm. No fibroids or other mass visualized. Endometrium Thickness: 9 mm. No focal abnormality visualized. Right ovary Measurements: 4.5 x 2.7 x 2.6 cm. Normal appearance/no adnexal mass. Left ovary Measurements: 4.8 x 1.8 x 2.1 cm. Normal appearance/no adnexal mass. Other findings No abnormal free fluid. IMPRESSION: No acute abnormality noted. Electronically Signed By: Alcide CleverMark Lukens M.D. On: 04/25/2015 21:01   Ct Abdomen Pelvis W Contrast  04/25/2015 CLINICAL DATA: Generalized abdominal pain and back pain for 2 days EXAM: CT ABDOMEN AND  PELVIS WITH CONTRAST TECHNIQUE: Multidetector CT imaging of the abdomen and pelvis was performed using the standard protocol following bolus administration of intravenous contrast. CONTRAST: 80 mL ISOVUE-300 IOPAMIDOL (ISOVUE-300) INJECTION 61% COMPARISON: None. FINDINGS: Lung bases are free of acute infiltrate or sizable effusion. The liver, gallbladder, spleen, adrenal glands and pancreas are within normal limits. The kidneys demonstrate a normal enhancement pattern bilaterally. No renal calculi or obstructive changes are noted. The appendix is air-filled and within normal limits. The bladder is partially distended. The uterus is within normal limits. Follicular changes are noted within the ovaries bilaterally. Very minimal pericolonic inflammatory changes noted in the distal descending colon and extending into the proximal sigmoid colon. No abscess or perforation is identified. Prominent vasculature is noted within the pelvis which may represent some mild pelvic varices. No bony abnormality is noted. IMPRESSION: Changes consistent with focal colitis in the descending and sigmoid colon. No other focal abnormality is noted. Electronically Signed  By: Alcide CleverMark Lukens M.D. On: 04/25/2015 21:33           Arvilla Marketatherine Lauren Darnisha Vernet, DO 04/28/2015, 8:42 AM PGY-1,  Family Medicine FPTS Intern pager: [phone], text pages welcome

## 2015-04-28 NOTE — Progress Notes (Signed)
Pt requested for Prune juice to help her move her bowels. RN gave patient prune juice, check on pt 30 minutes later and pt stated that she feels like her bowels are moving. Pt was able to tolerate prune and orange juice with no complains of nausea. Will continue to monitor pt at this time. Tobin Chadracy Jamiere Gulas 04/28/2015 4:20 PM

## 2015-04-29 LAB — FECAL LACTOFERRIN, QUANT: Fecal Lactoferrin: NEGATIVE

## 2015-04-29 LAB — OCCULT BLOOD X 1 CARD TO LAB, STOOL: FECAL OCCULT BLD: NEGATIVE

## 2015-04-29 MED ORDER — IBUPROFEN 800 MG PO TABS: 800.0000 mg | ORAL_TABLET | ORAL | Status: DC | PRN

## 2015-04-29 MED ORDER — DOXYCYCLINE HYCLATE 100 MG PO TABS: 100.0000 mg | ORAL_TABLET | Freq: Two times a day (BID) | ORAL | Status: DC

## 2015-04-29 MED ORDER — PROMETHAZINE HCL 12.5 MG PO TABS: 12.5000 mg | ORAL_TABLET | Freq: Four times a day (QID) | ORAL | Status: DC | PRN

## 2015-04-29 MED ORDER — METRONIDAZOLE 500 MG PO TABS: 500.0000 mg | ORAL_TABLET | Freq: Two times a day (BID) | ORAL | Status: DC

## 2015-04-29 MED ORDER — POLYETHYLENE GLYCOL 3350 17 G PO PACK: 17.0000 g | PACK | Freq: Every day | ORAL | Status: DC

## 2015-04-29 NOTE — Discharge Instructions (Signed)
You were hospitalized for pelvic inflammatory disease. It is very important that you finish all of your antibiotics. Take the Doxycycline through the 23rd and the Flagyl through the 25th. You can take Phenergan for nausea and high dose ibuprofen for pain. You can also take Tylenol if needed.   Do not have intercourse for 2 weeks after finishing the antibiotics. Please inform your partners of your positive Gonorrhea and Chlamydia. They were need to be tested and treated. Do not have intercourse with any partners that are positive for these STDs until they have been treated.   You will need to follow up with your primary care doctor to have repeat testing to make sure you have cleared the infection or have not been re-infected.     Pelvic Inflammatory Disease Pelvic inflammatory disease (PID) refers to an infection in some or all of the female organs. The infection can be in the uterus, ovaries, fallopian tubes, or the surrounding tissues in the pelvis. PID can cause abdominal or pelvic pain that comes on suddenly (acute pelvic pain). PID is a serious infection because it can lead to lasting (chronic) pelvic pain or the inability to have children (infertility). CAUSES This condition is most often caused by an infection that is spread during sexual contact. However, the infection can also be caused by the normal bacteria that are found in the vaginal tissues if these bacteria travel upward into the reproductive organs. PID can also occur following:  The birth of a baby.  A miscarriage.  An abortion.  Major pelvic surgery.  The use of an intrauterine device (IUD).  A sexual assault. RISK FACTORS This condition is more likely to develop in women who:  Are younger than 23 years of age.  Are sexually active at [facility] age.  Use nonbarrier contraception.  Have multiple sexual partners.  Have sex with someone who has symptoms of an STD (sexually transmitted disease).  Use oral  contraception. At times, certain behaviors can also increase the possibility of getting PID, such as:  Using a vaginal douche.  Having an IUD in place. SYMPTOMS Symptoms of this condition include:  Abdominal or pelvic pain.  Fever.  Chills.  Abnormal vaginal discharge.  Abnormal uterine bleeding.  Unusual pain shortly after the end of a menstrual period.  Painful urination.  Pain with sexual intercourse.  Nausea and vomiting. DIAGNOSIS To diagnose this condition, your health care provider will do a physical exam and take your medical history. A pelvic exam typically reveals great tenderness in the uterus and the surrounding pelvic tissues. You may also have tests, such as:  Lab tests, including a pregnancy test, blood tests, and urine test.  Culture tests of the vagina and cervix to check for an STD.  Ultrasound.  A laparoscopic procedure to look inside the pelvis.  Examining vaginal secretions under a microscope. TREATMENT Treatment for this condition may involve one or more approaches.  Antibiotic medicines may be prescribed to be taken by mouth.  Sexual partners may need to be treated if the infection is caused by an STD.  For more severe cases, hospitalization may be needed to give antibiotics directly into a vein through an IV tube.  Surgery may be needed if other treatments do not help, but this is rare. It may take weeks until you are completely well. If you are diagnosed with PID, you should also be checked for human immunodeficiency virus (HIV). Your health care provider may test you for infection again 3 months after  treatment. You should not have unprotected sex. HOME CARE INSTRUCTIONS  Take over-the-counter and prescription medicines only as told by your health care provider.  If you were prescribed an antibiotic medicine, take it as told by your health care provider. Do not stop taking the antibiotic even if you start to feel better.  Do not have  sexual intercourse until treatment is completed or as told by your health care provider. If PID is confirmed, your recent sexual partners will need treatment, especially if you had unprotected sex.  Keep all follow-up visits as told by your health care provider. This is important. SEEK MEDICAL CARE IF:  You have increased or abnormal vaginal discharge.  Your pain does not improve.  You vomit.  You have a fever.  You cannot tolerate your medicines.  Your partner has an STD.  You have pain when you urinate. SEEK IMMEDIATE MEDICAL CARE IF:  You have increased abdominal or pelvic pain.  You have chills.  Your symptoms are not better in 72 hours even with treatment.   This information is not intended to replace advice given to you by your health care provider. Make sure you discuss any questions you have with your health care provider.   Document Released: 01/02/2005 Document Revised: 09/23/2014 Document Reviewed: 02/09/2014 Elsevier Interactive Patient Education [employer]2016 [employer]

## 2015-04-29 NOTE — Progress Notes (Signed)
Pt discharge education and instructions completed with pt at bedside; pt voices understanding and denied any questions. Pt IV  Removed; pt discharge home with family to transport her home. Pt offered wheelchair but she refused and ambulated off unit belongings to the side. Pt to pick up electronically sent medication from preferred pharmacy on file. Dionne BucyP. Amo Kjell Brannen RN

## 2015-12-20 ENCOUNTER — Emergency Department (HOSPITAL_COMMUNITY)
Admission: EM | Admit: 2015-12-20 | Discharge: 2015-12-21 | Disposition: A | Discharge status: Home / Self Care | Payer: Self-pay | Attending: Emergency Medicine | Admitting: Emergency Medicine

## 2015-12-20 ENCOUNTER — Encounter (HOSPITAL_COMMUNITY): Payer: Self-pay | Admitting: Emergency Medicine

## 2015-12-20 ENCOUNTER — Emergency Department (HOSPITAL_COMMUNITY): Payer: Self-pay

## 2015-12-20 DIAGNOSIS — Z79899 Other long term (current) drug therapy: Secondary | ICD-10-CM | POA: Insufficient documentation

## 2015-12-20 DIAGNOSIS — F19920 Other psychoactive substance use, unspecified with intoxication, uncomplicated: Secondary | ICD-10-CM

## 2015-12-20 DIAGNOSIS — F1110 Opioid abuse, uncomplicated: Secondary | ICD-10-CM | POA: Insufficient documentation

## 2015-12-20 DIAGNOSIS — F4323 Adjustment disorder with mixed anxiety and depressed mood: Secondary | ICD-10-CM | POA: Insufficient documentation

## 2015-12-20 DIAGNOSIS — R930 Abnormal findings on diagnostic imaging of skull and head, not elsewhere classified: Secondary | ICD-10-CM | POA: Insufficient documentation

## 2015-12-20 DIAGNOSIS — J45909 Unspecified asthma, uncomplicated: Secondary | ICD-10-CM | POA: Insufficient documentation

## 2015-12-20 LAB — RAPID URINE DRUG SCREEN, HOSP PERFORMED
Amphetamines: NOT DETECTED
BARBITURATES: NOT DETECTED
Benzodiazepines: POSITIVE — AB
COCAINE: NOT DETECTED
Opiates: POSITIVE — AB
TETRAHYDROCANNABINOL: POSITIVE — AB

## 2015-12-20 LAB — CBC
HEMATOCRIT: 36.8 % (ref 36.0–46.0)
Hemoglobin: 12 g/dL (ref 12.0–15.0)
MCH: 28.6 pg (ref 26.0–34.0)
MCHC: 32.6 g/dL (ref 30.0–36.0)
MCV: 87.6 fL (ref 78.0–100.0)
Platelets: 312 10*3/uL (ref 150–400)
RBC: 4.2 MIL/uL (ref 3.87–5.11)
RDW: 15.8 % — AB (ref 11.5–15.5)
WBC: 9.7 10*3/uL (ref 4.0–10.5)

## 2015-12-20 LAB — COMPREHENSIVE METABOLIC PANEL
ALT: 15 U/L (ref 14–54)
AST: 21 U/L (ref 15–41)
Albumin: 3.9 g/dL (ref 3.5–5.0)
Alkaline Phosphatase: 52 U/L (ref 38–126)
Anion gap: 8 (ref 5–15)
BILIRUBIN TOTAL: 0.1 mg/dL — AB (ref 0.3–1.2)
BUN: 10 mg/dL (ref 6–20)
CHLORIDE: 107 mmol/L (ref 101–111)
CO2: 22 mmol/L (ref 22–32)
CREATININE: 0.57 mg/dL (ref 0.44–1.00)
Calcium: 8.6 mg/dL — ABNORMAL LOW (ref 8.9–10.3)
GFR calc Af Amer: >60 mL/min (ref >60)
GLUCOSE: 87 mg/dL (ref 65–99)
Potassium: 3.7 mmol/L (ref 3.5–5.1)
Sodium: 137 mmol/L (ref 135–145)
Total Protein: 6.6 g/dL (ref 6.5–8.1)

## 2015-12-20 LAB — URINALYSIS, ROUTINE W REFLEX MICROSCOPIC
Bilirubin Urine: NEGATIVE
GLUCOSE, UA: NEGATIVE mg/dL
Hgb urine dipstick: NEGATIVE
KETONES UR: NEGATIVE mg/dL
NITRITE: NEGATIVE
PROTEIN: NEGATIVE mg/dL
Specific Gravity, Urine: 1.025 (ref 1.005–1.030)
pH: 7 (ref 5.0–8.0)

## 2015-12-20 LAB — ETHANOL

## 2015-12-20 LAB — URINE MICROSCOPIC-ADD ON: RBC / HPF: NONE SEEN RBC/hpf (ref 0–5)

## 2015-12-20 LAB — PREGNANCY, URINE: Preg Test, Ur: NEGATIVE

## 2015-12-20 LAB — SALICYLATE LEVEL: Salicylate Lvl: <7 mg/dL (ref 2.8–30.0)

## 2015-12-20 LAB — ACETAMINOPHEN LEVEL: Acetaminophen (Tylenol), Serum: <10 ug/mL — ABNORMAL LOW (ref 10–30)

## 2015-12-20 MED ORDER — SODIUM CHLORIDE 0.9 % IV BOLUS (SEPSIS)
1000.0000 mL | Freq: Once | INTRAVENOUS | Status: AC
  Administered 2015-12-20: 1000 mL via INTRAVENOUS

## 2015-12-20 NOTE — ED Notes (Signed)
Pt's daughter passed away in September at 11months old; mother states that she had an undiagnosed cyst to the back of her throat; pt states that she "cannot deal with her daughter's death"; pt states that she has been unable to talk to anyone because they don't understand what she is going through; pt states that she has had people make remarks and judgements about her daughter's death and she believes they are blaming her; pt denies SI but states "I don't really care about anything anymore"; pt states "I feel like I am dying from a broken heart"; pt c/o anxiety and depression from daughter's death; pt states that she took a 1mg  Xanax from a friend last night to help her sleep; mother and boyfriend reports slurred speech and unstable gait this morning; pt remains slurring her words currently at some points; pt is oriented and able to carry on a conversation

## 2015-12-20 NOTE — ED Triage Notes (Signed)
Per EMS, pt from home.  Pt lost her 121 month old infant.  Has been depressed since then.  Today she has slept all day.  Mom states she was difficult to awake/lethargic.  Pt cannot stand without assistance.  Pt denies drugs/alcohol.  Pupils pinpoint on assessment.  Pt making statements that she doesn't want to live anymore.  Vitals: 115/77, hr 88, resp 16, 100% ra, cbg 108.  22g rt hand

## 2015-12-20 NOTE — ED Provider Notes (Signed)
[facility]-EMERGENCY DEPT Provider Note   CSN: [id number] Arrival date & time: 12/20/15  1840  By signing my name below, I, Vista Minkobert Ross, attest that this documentation has been prepared under the direction and in the presence of [employer] Tyrianna Lightle PA-C.  Electronically Signed: Vista Minkobert Ross, ED Scribe. 12/20/15. 8:27 PM.   History   Chief Complaint Chief Complaint  Patient presents with  . Altered Mental Status  . Depression    HPI HPI Comments: Monica Munoz is a 25 y.o. female with Hx of postpartum depression, who presents to the Emergency Department for complaints of depression. Pt's 7611 month old daughter passed away and states that she has been depressed since then. Mother states that she slept for the majority of the day today and that she was difficult to arouse. Pt has Hx of PTSD with Hx of post-partum depression but has never felt this depressed before. Pt describes that she feels "limp". She has previously tried to take medication for depression. Pt took a 1mg  Xanax from her friend last night which helped her sleep. Pt states that she does not want to talk anyone about her symptoms because they wouldn't understand what she is going through.   The history is provided by the patient and a parent. No language interpreter was used.    Past Medical History:  Diagnosis Date  . Anxiety   . Asthma   . Depression   . Migraine with aura 04/25/2012    Patient Active Problem List   Diagnosis Date Noted  . Colitis, acute 04/25/2015  . Pelvic inflammatory disease 04/25/2015  . 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 Tachycardia 04/25/2015  . Abdominal pain 04/25/2015  . Acute PID (pelvic inflammatory disease) 04/25/2015  . Pelvic pain in female   . Vaginal discharge   . Mastitis 02/19/2015  . Cellulitis of right breast 02/19/2015  . Migraine 02/19/2015  . Leukocytosis   . Other migraine without status migrainosus, not intractable   . NSVD (normal spontaneous vaginal delivery) 10/14/2014  .  Preterm uterine contractions in third trimester, antepartum 08/26/2014  . Anemia during pregnancy in third trimester 07/09/2014  . Supervision of normal pregnancy, antepartum 04/16/2014  . Anxiety state, unspecified 05/26/2013  . Asthma, mild intermittent 05/26/2013  . Depression 05/26/2013  . Headache, migraine 01/29/2012    Past Surgical History:  Procedure Laterality Date  . WISDOM TOOTH EXTRACTION      OB History    Gravida Para Term Preterm AB Living   2 2 2  0 0 2   SAB TAB Ectopic Multiple Live Births   0 0 0 0 2       Home Medications    Prior to Admission medications   Medication Sig Start Date End Date Taking? Authorizing Provider  albuterol (PROVENTIL HFA;VENTOLIN HFA) 108 (90 BASE) MCG/ACT inhaler Inhale 2 puffs into the lungs every 6 (six) hours as needed for wheezing or shortness of breath.    Yes Historical Provider, MD  ibuprofen (ADVIL,MOTRIN) 800 MG tablet Take 1 tablet (800 mg total) by mouth every 4 (four) hours as needed for mild pain or moderate pain. 04/29/15  Yes Arvilla Marketatherine Lauren Wallace, DO  doxycycline (VIBRA-TABS) 100 MG tablet Take 1 tablet (100 mg total) by mouth 2 (two) times daily. Patient not taking: Reported on 12/20/2015 04/29/15   Arvilla Marketatherine Lauren Wallace, DO  metroNIDAZOLE (FLAGYL) 500 MG tablet Take 1 tablet (500 mg total) by mouth 2 (two) times daily. Patient not taking: Reported on 12/20/2015 04/29/15   Arvilla Marketatherine Lauren Wallace,  DO  polyethylene glycol (MIRALAX / GLYCOLAX) packet Take 17 g by mouth daily. Patient not taking: Reported on 12/20/2015 04/29/15   Arvilla Market, DO  promethazine (PHENERGAN) 12.5 MG tablet Take 1 tablet (12.5 mg total) by mouth every 6 (six) hours as needed for nausea or vomiting. Patient not taking: Reported on 12/20/2015 04/29/15   Arvilla Market, DO    Family History Family History  Problem Relation Age of Onset  . Diabetes Maternal Grandfather   . Hyperlipidemia Maternal Grandfather   . Stroke  Maternal Grandfather   . Heart murmur Mother   . Miscarriages / [place] Mother   . Diabetes Mother   . Autism Paternal Uncle   . Down syndrome Paternal Uncle   . Stroke Maternal Grandmother     Social History Social History  Substance Use Topics  . Smoking status: Never Smoker  . Smokeless tobacco: Never Used  . Alcohol use No    Allergies   Benadryl [diphenhydramine hcl]; Diphenhydramine hcl; Lorazepam; Vancomycin; and Adhesive [tape]   Review of Systems Review of Systems A complete 10 system review of systems was obtained and all systems are negative except as noted in the HPI and PMH.    Physical Exam Updated Vital Signs BP 126/75   Pulse 76   Resp 16   Ht 5\' 5"  (1.651 m)   Wt 66 kg   SpO2 100%   BMI 24.23 kg/m   Physical Exam  Constitutional: She is oriented to person, place, and time. She appears well-developed and well-nourished. No distress.  Nontoxic and in NAD  HENT:  Head: Normocephalic and atraumatic.  Mouth/Throat: Oropharynx is clear and moist.  Symmetric rise of the uvula with phonation  Eyes: Conjunctivae and EOM are normal. Pupils are equal, round, and reactive to light. No scleral icterus.  Pupils 2mm bilaterally and reactive  Neck: Normal range of motion.  Cardiovascular: Normal rate and regular rhythm.   Pulmonary/Chest: Effort normal. No respiratory distress.  Musculoskeletal: Normal range of motion.  Neurological: She is alert and oriented to person, place, and time. No cranial nerve deficit. She exhibits normal muscle tone. Coordination normal.  GCS 15. Speech is slurred, suspected secondary to intoxication. Patient moving all extremities. No focal deficits appreciated.  Skin: Skin is warm and dry. No rash noted. She is not diaphoretic. No erythema. No pallor.  Psychiatric: Her speech is slurred. She is withdrawn. She exhibits a depressed mood. She expresses no homicidal ideation. She expresses no suicidal plans and no homicidal plans.    Nursing note and vitals reviewed.    ED Treatments / Results  DIAGNOSTIC STUDIES: Oxygen Saturation is 100% on RA, normal by my interpretation.  COORDINATION OF CARE: 8:24 PM-Discussed treatment plan with pt at bedside and pt agreed to plan.   Labs (all labs ordered are listed, but only abnormal results are displayed) Labs Reviewed  COMPREHENSIVE METABOLIC PANEL - Abnormal; Notable for the following:       Result Value   Calcium 8.6 (*)    Total Bilirubin 0.1 (*)    All other components within normal limits  CBC - Abnormal; Notable for the following:    RDW 15.8 (*)    All other components within normal limits  RAPID URINE DRUG SCREEN, HOSP PERFORMED - Abnormal; Notable for the following:    Opiates POSITIVE (*)    Benzodiazepines POSITIVE (*)    Tetrahydrocannabinol POSITIVE (*)    All other components within normal limits  URINALYSIS, ROUTINE W REFLEX  MICROSCOPIC (NOT AT [facility]) - Abnormal; Notable for the following:    APPearance CLOUDY (*)    Leukocytes, UA LARGE (*)    All other components within normal limits  URINE MICROSCOPIC-ADD ON - Abnormal; Notable for the following:    Squamous Epithelial / LPF 6-30 (*)    Bacteria, UA MANY (*)    All other components within normal limits  ACETAMINOPHEN LEVEL - Abnormal; Notable for the following:    Acetaminophen (Tylenol), Serum <10 (*)    All other components within normal limits  URINE CULTURE  ETHANOL  PREGNANCY, URINE  SALICYLATE LEVEL    EKG  EKG Interpretation  Date/Time:  Monday December 20 2015 20:50:20 EST Ventricular Rate:  74 PR Interval:    QRS Duration: 92 QT Interval:  410 QTC Calculation: 455 R Axis:   63 Text Interpretation:  Sinus rhythm Confirmed by Lincoln Brigham [phone]) on 12/20/2015 8:54:26 PM       Radiology Ct Head Wo Contrast  Result Date: 12/20/2015 CLINICAL DATA:  Left-sided weakness. EXAM: CT HEAD WITHOUT CONTRAST TECHNIQUE: Contiguous axial images were obtained from the base of the  skull through the vertex without intravenous contrast. COMPARISON:  Head CT 12/07/2010 FINDINGS: Brain: No evidence of acute infarction, hemorrhage, hydrocephalus, extra-axial collection or mass lesion/mass effect. Vascular: No hyperdense vessel or unexpected calcification. Skull: Normal. Negative for fracture or focal lesion. Sinuses/Orbits: Small fluid levels in both maxillary sinuses. No acute orbital finding. Other: None. IMPRESSION: 1.  No acute intracranial abnormality. 2. Fluid levels in the maxillary sinuses, can be seen with acute sinusitis. Electronically Signed   By: Rubye Oaks M.D.   On: 12/20/2015 21:29    Procedures Procedures (including critical care time)  Medications Ordered in ED Medications  sodium chloride 0.9 % bolus 1,000 mL (0 mLs Intravenous Stopped 12/21/15 0052)     Initial Impression / Assessment and Plan / ED Course  I have reviewed the triage vital signs and the nursing notes.  Pertinent labs & imaging results that were available during my care of the patient were reviewed by me and considered in my medical decision making (see chart for details).  Clinical Course     25 year old female presents to the emergency department for generalized weakness and depression. This is secondary to the loss of her 75-month-old daughter in September. Patient obviously depressed. She has slurred speech which is appreciated to be secondary to intoxication. Intoxication due to opioid use which the patient has positive for on her UDS, consistent with physical exam findings of pinpoint pupils. Patient with nonfocal neurologic exam, though she showed poor effort. A head CT was performed which is negative for CVA or hemorrhage. Remainder of workup is reassuring. Urine sent for culture as findings are most consistent with a contaminated specimen.  Patient has been evaluated by TTS who believe she is stable for outpatient management with a counselor. Counseling service resource guide  given and patient has been encouraged to return for new or worsening symptoms. Patient able to ambulate in the emergency department without assistance. She was discharged from the department in the care of her mother in stable condition.   Final Clinical Impressions(s) / ED Diagnoses   Final diagnoses:  Adjustment disorder with mixed anxiety and depressed mood  Drug intoxication without complication [facility])    New Prescriptions Discharge Medication List as of 12/21/2015 12:20 AM      I personally performed the services described in this documentation, which was scribed in my presence. The recorded  information has been reviewed and is accurate.       Antony MaduraKelly Polina Burmaster, PA-C 12/21/15 0215    Tilden FossaElizabeth Rees, MD 12/21/15 [phone]

## 2015-12-20 NOTE — ED Notes (Signed)
Bed: WA10 Expected date:  Expected time:  Means of arrival:  Comments: Hold for triage 1 

## 2015-12-20 NOTE — ED Triage Notes (Signed)
Pt states she is "not really suicidal at this time" but since she lost her little girl she is really depressed and everything is going down hill. Patient's speech hard to understand and sometimes makes comments off topic.  Boyfriend at bedside.

## 2015-12-21 NOTE — ED Notes (Signed)
Pt ambulated in the hallway without assistance; pt stumbled a couple of times but did not loose balance; pt leaned to one side for a few seconds but was able to ambulate the full length of the hall
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
## 2015-12-22 LAB — URINE CULTURE

## 2016-01-17 NOTE — L&D Delivery Note (Signed)
Delivery Note At 1:25 PM a viable and healthy female was delivered via Vaginal, Spontaneous Delivery (Presentation: OP).  APGAR: 9, 9; weight  pending.   Placenta status: spontaneous, intect. No evidence of of abruption  Cord: 3VC with the following complications: .  Cord pH: NA  Anesthesia: Epidural Episiotomy: None Lacerations: 2nd degree Suture Repair: 3.0 vicryl rapide Est. Blood Loss (mL): 450  Mom to postpartum.  Baby to Couplet care / Skin to Skin. Placenta to: BS Feeding: Breast Circ: No Contraception: None  [place], CNM 10/03/2016, 2:36 PM

## 2016-01-26 ENCOUNTER — Encounter (HOSPITAL_COMMUNITY): Payer: Self-pay | Admitting: *Deleted

## 2016-01-26 ENCOUNTER — Ambulatory Visit (HOSPITAL_COMMUNITY)
Admission: EM | Admit: 2016-01-26 | Discharge: 2016-01-26 | Disposition: A | Discharge status: Home / Self Care | Payer: Medicaid Other | Attending: Family Medicine | Admitting: Family Medicine

## 2016-01-26 ENCOUNTER — Ambulatory Visit (HOSPITAL_COMMUNITY): Payer: Medicaid Other

## 2016-01-26 DIAGNOSIS — S63501A Unspecified sprain of right wrist, initial encounter: Secondary | ICD-10-CM | POA: Diagnosis not present

## 2016-01-26 LAB — POCT PREGNANCY, URINE: Preg Test, Ur: POSITIVE — AB

## 2016-01-26 NOTE — ED Provider Notes (Signed)
CSN: [id number]     Arrival date & time 01/26/16  7425 History   None    Chief Complaint  Patient presents with  . Wrist Pain   (Consider location/radiation/quality/duration/timing/severity/associated sxs/prior Treatment) Patient c/o pain in right wrist   The history is provided by the patient.  Wrist Pain  This is a new problem. The problem occurs constantly. The problem has not changed since onset.The symptoms are aggravated by exertion. Nothing relieves the symptoms.    Past Medical History:  Diagnosis Date  . Anxiety   . Asthma   . Depression   . Migraine with aura 04/25/2012   Past Surgical History:  Procedure Laterality Date  . WISDOM TOOTH EXTRACTION     Family History  Problem Relation Age of Onset  . Diabetes Maternal Grandfather   . Hyperlipidemia Maternal Grandfather   . Stroke Maternal Grandfather   . Heart murmur Mother   . Miscarriages / [place] Mother   . Diabetes Mother   . Autism Paternal Uncle   . Down syndrome Paternal Uncle   . Stroke Maternal Grandmother    Social History  Substance Use Topics  . Smoking status: Never Smoker  . Smokeless tobacco: Never Used  . Alcohol use No   OB History    Gravida Para Term Preterm AB Living   2 2 2  0 0 2   SAB TAB Ectopic Multiple Live Births   0 0 0 0 2     Review of Systems  Constitutional: Negative.   HENT: Negative.   Eyes: Negative.   Respiratory: Negative.   Cardiovascular: Negative.   Musculoskeletal: Positive for arthralgias.  Hematological: Negative.   Psychiatric/Behavioral: Negative.     Allergies  Benadryl [diphenhydramine hcl]; Diphenhydramine hcl; Lorazepam; Vancomycin; and Adhesive [tape]  Home Medications   Prior to Admission medications   Medication Sig Start Date End Date Taking? Authorizing Provider  albuterol (PROVENTIL HFA;VENTOLIN HFA) 108 (90 BASE) MCG/ACT inhaler Inhale 2 puffs into the lungs every 6 (six) hours as needed for wheezing or shortness of breath.      Historical Provider, MD  doxycycline (VIBRA-TABS) 100 MG tablet Take 1 tablet (100 mg total) by mouth 2 (two) times daily. Patient not taking: Reported on 12/20/2015 04/29/15   Arvilla Market, DO  ibuprofen (ADVIL,MOTRIN) 800 MG tablet Take 1 tablet (800 mg total) by mouth every 4 (four) hours as needed for mild pain or moderate pain. 04/29/15   Arvilla Market, DO  metroNIDAZOLE (FLAGYL) 500 MG tablet Take 1 tablet (500 mg total) by mouth 2 (two) times daily. Patient not taking: Reported on 12/20/2015 04/29/15   Arvilla Market, DO  polyethylene glycol [facility] / GLYCOLAX) packet Take 17 g by mouth daily. Patient not taking: Reported on 12/20/2015 04/29/15   Arvilla Market, DO  promethazine (PHENERGAN) 12.5 MG tablet Take 1 tablet (12.5 mg total) by mouth every 6 (six) hours as needed for nausea or vomiting. Patient not taking: Reported on 12/20/2015 04/29/15   Arvilla Market, DO   Meds Ordered and Administered this Visit  Medications - No data to display  BP 120/72 (BP Location: Right Arm)   Pulse 78   Temp 98.6 F (37 C) (Oral)   Resp 18   LMP 12/17/2015   SpO2 100%  No data found.   Physical Exam  Constitutional: She appears well-developed and well-nourished.  HENT:  Head: Normocephalic and atraumatic.  Eyes: EOM are normal. Pupils are equal, round, and reactive to light.  Cardiovascular: Normal rate, regular rhythm and normal heart sounds.   Pulmonary/Chest: Effort normal and breath sounds normal.  Musculoskeletal: She exhibits tenderness.  TTP right wrist and right 5th metacarpal.  No swelling or deformity  Neurological: She is alert.  Nursing note and vitals reviewed.   Urgent Care Course   Clinical Course     Procedures (including critical care time)  Labs Review Labs Reviewed  POCT PREGNANCY, URINE - Abnormal; Notable for the following:       Result Value   Preg Test, Ur POSITIVE (*)    All other components within normal  limits    Imaging Review No results found.   Visual Acuity Review  Right Eye Distance:   Left Eye Distance:   Bilateral Distance:    Right Eye Near:   Left Eye Near:    Bilateral Near:         MDM   1. Sprain of right wrist, initial encounter    Pregnancy - Advised to get on prenatal vitamins and to Follow up with PCP.  Right cock up splint Take tylenol otc     Deatra CanterWilliam J Oxford, FNP 01/26/16 1857

## 2016-01-26 NOTE — ED Triage Notes (Signed)
Pt   Reports        Pain  r  Wrist        For  Several  Days  History  Of old  Injury  To  The  Wrist      Pt  Ambulatory  To exam room   In no  Acute distress    No obvious  Deformity  Noted

## 2016-01-28 ENCOUNTER — Encounter (HOSPITAL_COMMUNITY): Payer: Self-pay | Admitting: *Deleted

## 2016-01-28 ENCOUNTER — Inpatient Hospital Stay (HOSPITAL_COMMUNITY): Payer: Medicaid Other

## 2016-01-28 ENCOUNTER — Inpatient Hospital Stay (HOSPITAL_COMMUNITY)
Admission: AD | Admit: 2016-01-28 | Discharge: 2016-01-28 | Disposition: A | Discharge status: Home / Self Care | Payer: Medicaid Other | Source: Ambulatory Visit | Attending: Obstetrics and Gynecology | Admitting: Obstetrics and Gynecology

## 2016-01-28 DIAGNOSIS — Z79899 Other long term (current) drug therapy: Secondary | ICD-10-CM | POA: Insufficient documentation

## 2016-01-28 DIAGNOSIS — O26899 Other specified pregnancy related conditions, unspecified trimester: Secondary | ICD-10-CM

## 2016-01-28 DIAGNOSIS — Z3A01 Less than 8 weeks gestation of pregnancy: Secondary | ICD-10-CM | POA: Diagnosis not present

## 2016-01-28 DIAGNOSIS — Z881 Allergy status to other antibiotic agents status: Secondary | ICD-10-CM | POA: Insufficient documentation

## 2016-01-28 DIAGNOSIS — R109 Unspecified abdominal pain: Secondary | ICD-10-CM | POA: Diagnosis not present

## 2016-01-28 DIAGNOSIS — R110 Nausea: Secondary | ICD-10-CM | POA: Insufficient documentation

## 2016-01-28 DIAGNOSIS — O26891 Other specified pregnancy related conditions, first trimester: Secondary | ICD-10-CM | POA: Diagnosis not present

## 2016-01-28 DIAGNOSIS — O3680X0 Pregnancy with inconclusive fetal viability, not applicable or unspecified: Secondary | ICD-10-CM

## 2016-01-28 DIAGNOSIS — O26892 Other specified pregnancy related conditions, second trimester: Secondary | ICD-10-CM | POA: Diagnosis not present

## 2016-01-28 LAB — URINALYSIS, ROUTINE W REFLEX MICROSCOPIC
BACTERIA UA: NONE SEEN
BILIRUBIN URINE: NEGATIVE
Glucose, UA: NEGATIVE mg/dL
Hgb urine dipstick: NEGATIVE
KETONES UR: 5 mg/dL — AB
Nitrite: NEGATIVE
PROTEIN: NEGATIVE mg/dL
Specific Gravity, Urine: 1.021 (ref 1.005–1.030)
pH: 5 (ref 5.0–8.0)

## 2016-01-28 LAB — POCT PREGNANCY, URINE: PREG TEST UR: POSITIVE — AB

## 2016-01-28 LAB — CBC WITH DIFFERENTIAL/PLATELET
BASOS ABS: 0 10*3/uL (ref 0.0–0.1)
BASOS PCT: 0 %
EOS ABS: 0.6 10*3/uL (ref 0.0–0.7)
EOS PCT: 6 %
HCT: 35.4 % — ABNORMAL LOW (ref 36.0–46.0)
HEMOGLOBIN: 12 g/dL (ref 12.0–15.0)
LYMPHS ABS: 3.9 10*3/uL (ref 0.7–4.0)
Lymphocytes Relative: 39 %
MCH: 28.8 pg (ref 26.0–34.0)
MCHC: 33.9 g/dL (ref 30.0–36.0)
MCV: 85.1 fL (ref 78.0–100.0)
Monocytes Absolute: 0.4 10*3/uL (ref 0.1–1.0)
Monocytes Relative: 4 %
NEUTROS PCT: 51 %
Neutro Abs: 5.1 10*3/uL (ref 1.7–7.7)
PLATELETS: 354 10*3/uL (ref 150–400)
RBC: 4.16 MIL/uL (ref 3.87–5.11)
RDW: 16.3 % — ABNORMAL HIGH (ref 11.5–15.5)
WBC: 10.1 10*3/uL (ref 4.0–10.5)

## 2016-01-28 LAB — HCG, QUANTITATIVE, PREGNANCY: HCG, BETA CHAIN, QUANT, S: 691 m[IU]/mL — AB (ref <5)

## 2016-01-28 LAB — WET PREP, GENITAL
CLUE CELLS WET PREP: NONE SEEN
Sperm: NONE SEEN
Trich, Wet Prep: NONE SEEN
YEAST WET PREP: NONE SEEN

## 2016-01-28 LAB — HIV ANTIBODY (ROUTINE TESTING W REFLEX): HIV SCREEN 4TH GENERATION: NONREACTIVE

## 2016-01-28 LAB — RPR: RPR Ser Ql: NONREACTIVE

## 2016-01-28 MED ORDER — PROMETHAZINE HCL 12.5 MG PO TABS: 12.5000 mg | ORAL_TABLET | Freq: Four times a day (QID) | ORAL | 0 refills | Status: DC | PRN

## 2016-01-28 NOTE — MAU Provider Note (Signed)
History     CSN: [id number]  Arrival date and time: 01/28/16 4098   First Provider Initiated Contact with Patient 01/28/16 [phone]      Chief Complaint  Patient presents with  . Abdominal Cramping   HPI [name] is a 26 y.o. G3P2002 at [redacted]w[redacted]d who presents to MAU today with complaint of lower abdominal pain rated at 6/10 since yesterday. The patient has not taken anything for pain. She denies vaginal bleeding, discharge, UTI symptoms or fever. She has had nausea without vomiting or diarrhea. She states LMP 12/17/15, but it was shorter and lighter than usual.    OB History    Gravida Para Term Preterm AB Living   3 2 2  0 0 2   SAB TAB Ectopic Multiple Live Births   0 0 0 0 2      Past Medical History:  Diagnosis Date  . Anxiety   . Asthma   . Depression   . Migraine with aura 04/25/2012    Past Surgical History:  Procedure Laterality Date  . WISDOM TOOTH EXTRACTION      Family History  Problem Relation Age of Onset  . Diabetes Maternal Grandfather   . Hyperlipidemia Maternal Grandfather   . Stroke Maternal Grandfather   . Heart murmur Mother   . Miscarriages / [place] Mother   . Diabetes Mother   . Autism Paternal Uncle   . Down syndrome Paternal Uncle   . Stroke Maternal Grandmother     Social History  Substance Use Topics  . Smoking status: Never Smoker  . Smokeless tobacco: Never Used  . Alcohol use No    Allergies:  Allergies  Allergen Reactions  . Benadryl [Diphenhydramine Hcl] Other (See Comments)    Red Man's and Sjogren's  . Lorazepam     Patient states that it makes her "loopy" and "off the wall"  . Vancomycin Itching    itching  . Adhesive [Tape] Dermatitis    hypofix tape only - itching    Prescriptions Prior to Admission  Medication Sig Dispense Refill Last Dose  . albuterol (PROVENTIL HFA;VENTOLIN HFA) 108 (90 BASE) MCG/ACT inhaler Inhale 2 puffs into the lungs every 6 (six) hours as needed for wheezing or shortness of breath.     Past Month at Unknown time    Review of Systems  Constitutional: Negative for fever.  Gastrointestinal: Positive for abdominal pain and nausea. Negative for constipation, diarrhea and vomiting.  Genitourinary: Negative for dysuria, frequency, urgency, vaginal bleeding and vaginal discharge.   Physical Exam   Blood pressure 104/58, pulse 83, temperature 98.5 F (36.9 C), temperature source Oral, resp. rate 18, height 5\' 5"  (1.651 m), weight 138 lb 4 oz (62.7 kg), last menstrual period 12/17/2015, unknown if currently breastfeeding.  Physical Exam  Nursing note and vitals reviewed. Constitutional: She is oriented to person, place, and time. She appears well-developed and well-nourished. No distress.  HENT:  Head: Normocephalic and atraumatic.  Cardiovascular: Normal rate.   Respiratory: Effort normal.  GI: Soft. She exhibits no distension and no mass. There is tenderness (mild tenderness to palpation of the LUQ and LLQ). There is no rebound and no guarding.  Neurological: She is alert and oriented to person, place, and time.  Skin: Skin is warm and dry. No erythema.  Psychiatric: She has a normal mood and affect.    Results for orders placed or performed during the hospital encounter of 01/28/16 (from the past 24 hour(s))  Urinalysis, Routine w reflex microscopic  Status: Abnormal   Collection Time: 01/28/16  7:55 AM  Result Value Ref Range   Color, Urine YELLOW YELLOW   APPearance HAZY (A) CLEAR   Specific Gravity, Urine 1.021 1.005 - 1.030   pH 5.0 5.0 - 8.0   Glucose, UA NEGATIVE NEGATIVE mg/dL   Hgb urine dipstick NEGATIVE NEGATIVE   Bilirubin Urine NEGATIVE NEGATIVE   Ketones, ur 5 (A) NEGATIVE mg/dL   Protein, ur NEGATIVE NEGATIVE mg/dL   Nitrite NEGATIVE NEGATIVE   Leukocytes, UA TRACE (A) NEGATIVE   RBC / HPF 6-30 0 - 5 RBC/hpf   WBC, UA 6-30 0 - 5 WBC/hpf   Bacteria, UA NONE SEEN NONE SEEN   Squamous Epithelial / LPF 0-5 (A) NONE SEEN   Mucous PRESENT    Ca  Oxalate Crys, UA PRESENT   Pregnancy, urine POC     Status: Abnormal   Collection Time: 01/28/16  7:58 AM  Result Value Ref Range   Preg Test, Ur POSITIVE (A) NEGATIVE  Wet prep, genital     Status: Abnormal   Collection Time: 01/28/16  8:08 AM  Result Value Ref Range   Yeast Wet Prep HPF POC NONE SEEN NONE SEEN   Trich, Wet Prep NONE SEEN NONE SEEN   Clue Cells Wet Prep HPF POC NONE SEEN NONE SEEN   WBC, Wet Prep HPF POC FEW (A) NONE SEEN   Sperm NONE SEEN   CBC with Differential/Platelet     Status: Abnormal   Collection Time: 01/28/16  8:15 AM  Result Value Ref Range   WBC 10.1 4.0 - 10.5 K/uL   RBC 4.16 3.87 - 5.11 MIL/uL   Hemoglobin 12.0 12.0 - 15.0 g/dL   HCT 40.9 (L) 81.1 - 91.4 %   MCV 85.1 78.0 - 100.0 fL   MCH 28.8 26.0 - 34.0 pg   MCHC 33.9 30.0 - 36.0 g/dL   RDW 78.2 (H) 95.6 - 21.3 %   Platelets 354 150 - 400 K/uL   Neutrophils Relative % 51 %   Neutro Abs 5.1 1.7 - 7.7 K/uL   Lymphocytes Relative 39 %   Lymphs Abs 3.9 0.7 - 4.0 K/uL   Monocytes Relative 4 %   Monocytes Absolute 0.4 0.1 - 1.0 K/uL   Eosinophils Relative 6 %   Eosinophils Absolute 0.6 0.0 - 0.7 K/uL   Basophils Relative 0 %   Basophils Absolute 0.0 0.0 - 0.1 K/uL  hCG, quantitative, pregnancy     Status: Abnormal   Collection Time: 01/28/16  8:15 AM  Result Value Ref Range   hCG, Beta Chain, Quant, S 691 (H) <5 mIU/mL   US Ob Comp Less 14 Wks  Result Date: 01/28/2016 CLINICAL DATA:  Abdominal pain affecting pregnancy. EXAM: OBSTETRIC <14 WK [place] AND TRANSVAGINAL OB TECHNIQUE: Both transabdominal and transvaginal ultrasound examinations were performed for complete evaluation of the gestation as well as the maternal uterus, adnexal regions, and pelvic cul-de-sac. Transvaginal technique was performed to assess early pregnancy. COMPARISON:  None. FINDINGS: Intrauterine gestational sac: Not visualized Yolk sac:  Not visualized Embryo:  Not visualized Cardiac Activity: Heart Rate:  bpm MSD:   mm    w      d CRL:     mm    w  d                  [place] EDC: Subchorionic hemorrhage:  N/A Maternal uterus/adnexae: Endometrium 19 mm in thickness with small amount of fluid within the endometrium.  Uterus is retroverted. Probable small hemorrhagic corpus luteum cyst on the right. No adnexal masses. Trace free fluid in the pelvis. IMPRESSION: No intrauterine pregnancy visualized. Differential considerations would include early intrauterine pregnancy too early to visualize, spontaneous abortion, or occult ectopic pregnancy. Recommend close clinical followup and serial quantitative beta HCGs and ultrasounds. Electronically Signed   By: Charlett NoseKevin  Dover M.D.   On: 01/28/2016 09:56   [place] Ob Transvaginal  Result Date: 01/28/2016 CLINICAL DATA:  Abdominal pain affecting pregnancy. EXAM: OBSTETRIC <14 WK US AND TRANSVAGINAL OB TECHNIQUE: Both transabdominal and transvaginal ultrasound examinations were performed for complete evaluation of the gestation as well as the maternal uterus, adnexal regions, and pelvic cul-de-sac. Transvaginal technique was performed to assess early pregnancy. COMPARISON:  None. FINDINGS: Intrauterine gestational sac: Not visualized Yolk sac:  Not visualized Embryo:  Not visualized Cardiac Activity: Heart Rate:  bpm MSD:   mm    w     d CRL:     mm    w  d                  US EDC: Subchorionic hemorrhage:  N/A Maternal uterus/adnexae: Endometrium 19 mm in thickness with small amount of fluid within the endometrium. Uterus is retroverted. Probable small hemorrhagic corpus luteum cyst on the right. No adnexal masses. Trace free fluid in the pelvis. IMPRESSION: No intrauterine pregnancy visualized. Differential considerations would include early intrauterine pregnancy too early to visualize, spontaneous abortion, or occult ectopic pregnancy. Recommend close clinical followup and serial quantitative beta HCGs and ultrasounds. Electronically Signed   By: Charlett NoseKevin  Dover M.D.   On: 01/28/2016 09:56    MAU Course   Procedures None  MDM +UPT UA, wet prep, GC/chlamydia, CBC, quant hCG, HIV, RPR and US today to rule out ectopic pregnancy A+ blood type  Assessment and Plan  A: Pregnancy of unknown location Abdominal pain in pregnancy Nausea without vomiting   P: Discharge home Rx for Phenergan sent to patient's pharmacy Ectopic precautions discussed Patient advised to follow-up in MAU on Sunday morning for repeat hCG or sooner if her condition were to change or worsen   Marny LowensteinJulie N Arbutus Nelligan, PA-C  01/28/2016, 10:26 AM

## 2016-01-28 NOTE — MAU Note (Signed)
Ben having really bad cramps and pains in lower abd, esp left side.  Started yesterday, feels like period cramps.

## 2016-01-28 NOTE — Discharge Instructions (Signed)

## 2016-01-28 NOTE — MAU Note (Signed)
Patient's daughter died in September at 3811 months of age.

## 2016-01-30 ENCOUNTER — Encounter (HOSPITAL_COMMUNITY): Payer: Self-pay

## 2016-01-30 ENCOUNTER — Inpatient Hospital Stay (HOSPITAL_COMMUNITY)
Admission: AD | Admit: 2016-01-30 | Discharge: 2016-01-30 | Disposition: A | Discharge status: Home / Self Care | Payer: Medicaid Other | Source: Ambulatory Visit | Attending: Family Medicine | Admitting: Family Medicine

## 2016-01-30 DIAGNOSIS — R102 Pelvic and perineal pain: Secondary | ICD-10-CM | POA: Diagnosis not present

## 2016-01-30 DIAGNOSIS — O26891 Other specified pregnancy related conditions, first trimester: Secondary | ICD-10-CM | POA: Diagnosis not present

## 2016-01-30 DIAGNOSIS — O3680X0 Pregnancy with inconclusive fetal viability, not applicable or unspecified: Secondary | ICD-10-CM

## 2016-01-30 DIAGNOSIS — Z09 Encounter for follow-up examination after completed treatment for conditions other than malignant neoplasm: Secondary | ICD-10-CM | POA: Insufficient documentation

## 2016-01-30 LAB — HCG, QUANTITATIVE, PREGNANCY: HCG, BETA CHAIN, QUANT, S: 2210 m[IU]/mL — AB (ref <5)

## 2016-01-30 NOTE — MAU Note (Signed)
Patient here for repeat HCG, no pain, no bleeding

## 2016-01-30 NOTE — Discharge Instructions (Signed)
Ectopic Pregnancy °An ectopic pregnancy happens when a fertilized egg grows outside the uterus. A pregnancy cannot live outside of the uterus. This problem often happens in the fallopian tube. It is often caused by damage to the fallopian tube. °If this problem is found early, you may be treated with medicine. If your tube tears or bursts open (ruptures), you will bleed inside. This is an emergency. You will need surgery. Get help right away. °What are the signs or symptoms? °You may have normal pregnancy symptoms at first. These include: °· Missing your period. °· Feeling sick to your stomach (nauseous). °· Being tired. °· Having tender breasts. ° °Then, you may start to have symptoms that are not normal. These include: °· Pain with sex (intercourse). °· Bleeding from the vagina. This includes light bleeding (spotting). °· Belly (abdomen) or lower belly cramping or pain. This may be felt on one side. °· A fast heartbeat (pulse). °· Passing out (fainting) after going poop (bowel movement). ° °If your tube tears, you may have symptoms such as: °· Really bad pain in the belly or lower belly. This happens suddenly. °· Dizziness. °· Passing out. °· Shoulder pain. ° °Get help right away if: °You have any of these symptoms. This is an emergency. °This information is not intended to replace advice given to you by your health care provider. Make sure you discuss any questions you have with your health care provider. °Document Released: 03/31/2008 Document Revised: 06/10/2015 Document Reviewed: 08/14/2012 °Elsevier Interactive Patient Education © 2017 Elsevier Inc. ° °

## 2016-01-30 NOTE — MAU Provider Note (Signed)
  Ms Monica Munoz is a 25yo G3P2001 who presents for f/u bHcg. She denies pain or cramping, no bleeding. Review of chart shows initial bHcg of 691 on 01/30/16.  BP 113/66, P 86 Lungs nl effort Skin warm & dry Psychiatric nl affect  BHcg: 2210  Early pregnancy (possibly 6.2wks) Unknown location  To return to [facility] on 02/01/16 at 11am for repeat bHcg Ectopic precautions given  Cam HaiSHAW, KIMBERLY CNM 01/30/2016 8:56 AM

## 2016-01-31 LAB — GC/CHLAMYDIA PROBE AMP (~~LOC~~) NOT AT ARMC
Chlamydia: POSITIVE — AB
NEISSERIA GONORRHEA: NEGATIVE

## 2016-02-01 ENCOUNTER — Telehealth (HOSPITAL_COMMUNITY): Payer: Self-pay | Admitting: *Deleted

## 2016-02-01 ENCOUNTER — Ambulatory Visit: Payer: Self-pay | Admitting: General Practice

## 2016-02-01 DIAGNOSIS — O3680X0 Pregnancy with inconclusive fetal viability, not applicable or unspecified: Secondary | ICD-10-CM

## 2016-02-01 LAB — HCG, QUANTITATIVE, PREGNANCY: hCG, Beta Chain, Quant, S: 6349 m[IU]/mL — ABNORMAL HIGH (ref <5)

## 2016-02-01 NOTE — Telephone Encounter (Signed)
Contacted pt. Due to positive Chlamydia results. Prescription called to [facility]. Azithromycin 1 gm x 1 dose. Advised to abstain from intercourse x 2 weeks and to let partner know. Form fax'd to health dept.

## 2016-02-01 NOTE — Progress Notes (Signed)
Patient here today for stat bhcg. Patient denies pain or bleeding. Patient would like to wait for results. Per Dr Alysia PennaErvin, patient had favorable rise in bhcg levels. Patient needs ultrasound this week. Scheduled for 1/19 @ 9am. Informed patient of appt and results with brief office appt to follow for results. Patient verbalized understanding to all & had no questions

## 2016-02-04 ENCOUNTER — Ambulatory Visit: Payer: Self-pay | Admitting: *Deleted

## 2016-02-04 ENCOUNTER — Ambulatory Visit (HOSPITAL_COMMUNITY)
Admission: RE | Admit: 2016-02-04 | Discharge: 2016-02-04 | Disposition: A | Discharge status: Home / Self Care | Payer: Self-pay | Source: Ambulatory Visit | Attending: Obstetrics and Gynecology | Admitting: Obstetrics and Gynecology

## 2016-02-04 DIAGNOSIS — O3680X0 Pregnancy with inconclusive fetal viability, not applicable or unspecified: Secondary | ICD-10-CM

## 2016-02-04 DIAGNOSIS — Z3490 Encounter for supervision of normal pregnancy, unspecified, unspecified trimester: Secondary | ICD-10-CM | POA: Insufficient documentation

## 2016-02-04 NOTE — Progress Notes (Signed)
Patient presents to clinic for f/u u/s results. Reviewed results with Dr Erin FullingHarraway Smith, she recommends f/u in 2 weeks. Discussed results with patient. Understanding voiced. Schedule u/s for Feb 2 @ 0745, clinic for results.

## 2016-02-11 ENCOUNTER — Encounter (HOSPITAL_COMMUNITY): Payer: Self-pay | Admitting: *Deleted

## 2016-02-11 ENCOUNTER — Inpatient Hospital Stay (HOSPITAL_COMMUNITY)
Admission: AD | Admit: 2016-02-11 | Discharge: 2016-02-11 | Disposition: A | Discharge status: Home / Self Care | Payer: Medicaid Other | Source: Ambulatory Visit | Attending: Obstetrics & Gynecology | Admitting: Obstetrics & Gynecology

## 2016-02-11 DIAGNOSIS — Z3A00 Weeks of gestation of pregnancy not specified: Secondary | ICD-10-CM | POA: Insufficient documentation

## 2016-02-11 DIAGNOSIS — R102 Pelvic and perineal pain: Secondary | ICD-10-CM

## 2016-02-11 DIAGNOSIS — O9989 Other specified diseases and conditions complicating pregnancy, childbirth and the puerperium: Secondary | ICD-10-CM | POA: Insufficient documentation

## 2016-02-11 DIAGNOSIS — O26891 Other specified pregnancy related conditions, first trimester: Secondary | ICD-10-CM | POA: Diagnosis not present

## 2016-02-11 DIAGNOSIS — Z79899 Other long term (current) drug therapy: Secondary | ICD-10-CM | POA: Diagnosis not present

## 2016-02-11 DIAGNOSIS — A749 Chlamydial infection, unspecified: Secondary | ICD-10-CM | POA: Diagnosis not present

## 2016-02-11 DIAGNOSIS — O98311 Other infections with a predominantly sexual mode of transmission complicating pregnancy, first trimester: Secondary | ICD-10-CM

## 2016-02-11 DIAGNOSIS — O26899 Other specified pregnancy related conditions, unspecified trimester: Secondary | ICD-10-CM

## 2016-02-11 LAB — WET PREP, GENITAL
Clue Cells Wet Prep HPF POC: NONE SEEN
SPERM: NONE SEEN
TRICH WET PREP: NONE SEEN
YEAST WET PREP: NONE SEEN

## 2016-02-11 LAB — URINALYSIS, ROUTINE W REFLEX MICROSCOPIC
Bilirubin Urine: NEGATIVE
Glucose, UA: NEGATIVE mg/dL
Hgb urine dipstick: NEGATIVE
Ketones, ur: NEGATIVE mg/dL
Nitrite: NEGATIVE
PH: 6 (ref 5.0–8.0)
Protein, ur: NEGATIVE mg/dL
SPECIFIC GRAVITY, URINE: 1.016 (ref 1.005–1.030)

## 2016-02-11 MED ORDER — ACETAMINOPHEN 500 MG PO TABS
1000.0000 mg | ORAL_TABLET | Freq: Four times a day (QID) | ORAL | Status: DC | PRN
  Administered 2016-02-11: 1000 mg via ORAL
  Filled 2016-02-11: qty 2

## 2016-02-11 MED ORDER — AZITHROMYCIN 250 MG PO TABS
1000.0000 mg | ORAL_TABLET | Freq: Once | ORAL | Status: AC
  Administered 2016-02-11: 1000 mg via ORAL
  Filled 2016-02-11: qty 4

## 2016-02-11 MED ORDER — ONDANSETRON 8 MG PO TBDP
8.0000 mg | ORAL_TABLET | Freq: Once | ORAL | Status: AC
  Administered 2016-02-11: 8 mg via ORAL
  Filled 2016-02-11: qty 1

## 2016-02-11 NOTE — MAU Note (Signed)
Having intermittent lower abdominal cramping for past 2 days; denies vaginal bleeding or vaginal discharge;

## 2016-02-11 NOTE — MAU Provider Note (Signed)
History     CSN: [id number]  Arrival date and time: 02/11/16 [id number]   First Provider Initiated Contact with Patient 02/11/16 [phone]      Chief Complaint  Patient presents with  . Abdominal Pain   G3P2002 with early pregnancy here with pelvic pain. She reports onset of constant cramping around 0400. She rates pain 6/10. She has not taken anything for the pain. She took a warm bath and had no relief. She denies aggrevating factors. She denies fever or chills. She denies urinary sx. She denies VB or discharge. Review of records reveals recent dx of CT. Pt reports taking Azithromycin 6 days ago however she vomited about 1 hour after. Her partner was treated as well and she has not had IC. She recently had US that showed +IUGS and +YS and will have f/u US next week.    OB History    Gravida Para Term Preterm AB Living   3 2 2  0 0 2   SAB TAB Ectopic Multiple Live Births   0 0 0 0 2      Past Medical History:  Diagnosis Date  . Anxiety   . Asthma   . Depression   . Migraine with aura 04/25/2012    Past Surgical History:  Procedure Laterality Date  . WISDOM TOOTH EXTRACTION      Family History  Problem Relation Age of Onset  . Diabetes Maternal Grandfather   . Hyperlipidemia Maternal Grandfather   . Stroke Maternal Grandfather   . Heart murmur Mother   . Misc[place] / [place]   . Diabetes Mother   . Autism Paternal Uncle   . Down syndrome Paternal Uncle   . Stroke Maternal Grandmother     Social History  Substance Use Topics  . Smoking status: Never Smoker  . Smokeless tobacco: Never Used  . Alcohol use No    Allergies:  Allergies  Allergen Reactions  . Benadryl [Diphenhydramine Hcl] Other (See Comments)    Red Man's and Sjogren's  . Lorazepam     Patient states that it makes her "loopy" and "off the wall"  . Vancomycin Itching    itching  . Adhesive [Tape] Dermatitis    hypofix tape only - itching    Prescriptions Prior to Admission  Medication  Sig Dispense Refill Last Dose  . Prenatal Vit-Fe Fumarate-FA (PRENATAL MULTIVITAMIN) TABS tablet Take 1 tablet by mouth daily at 12 noon.   02/10/2016 at Unknown time  . promethazine (PHENERGAN) 12.5 MG tablet Take 1 tablet (12.5 mg total) by mouth every 6 (six) hours as needed for nausea or vomiting. 30 tablet 0     Review of Systems  Constitutional: Negative for chills and fever.  Gastrointestinal: Positive for nausea and vomiting. Negative for constipation and diarrhea.  Genitourinary: Positive for pelvic pain. Negative for dysuria, hematuria and urgency.   Physical Exam   Blood pressure (!) 108/46, pulse (!) 57, temperature 98.4 F (36.9 C), temperature source Oral, resp. rate 16, last menstrual period 12/17/2015, unknown if currently breastfeeding.  Physical Exam  Nursing note and vitals reviewed. Constitutional: She is oriented to person, place, and time. She appears well-developed and well-nourished. No distress.  HENT:  Head: Normocephalic and atraumatic.  Neck: Normal range of motion.  Cardiovascular: Normal rate.   Respiratory: Effort normal.  GI: Soft. She exhibits no distension and no mass. There is no tenderness. There is no rebound and no guarding.  Genitourinary:  Genitourinary Comments: External: no lesions or erythema Vagina:  rugated, parous, small yellow discharge from os Uterus: non enlarged, anteverted, + tender, ++ CMT Adnexae: no masses, + tenderness left, + tenderness right   Musculoskeletal: Normal range of motion.  Neurological: She is alert and oriented to person, place, and time.  Skin: Skin is warm and dry.  Psychiatric: She has a normal mood and affect.   Results for orders placed or performed during the hospital encounter of 02/11/16 (from the past 24 hour(s))  Urinalysis, Routine w reflex microscopic     Status: Abnormal   Collection Time: 02/11/16  7:20 AM  Result Value Ref Range   Color, Urine YELLOW YELLOW   APPearance CLOUDY (A) CLEAR    Specific Gravity, Urine 1.016 1.005 - 1.030   pH 6.0 5.0 - 8.0   Glucose, UA NEGATIVE NEGATIVE mg/dL   Hgb urine dipstick NEGATIVE NEGATIVE   Bilirubin Urine NEGATIVE NEGATIVE   Ketones, ur NEGATIVE NEGATIVE mg/dL   Protein, ur NEGATIVE NEGATIVE mg/dL   Nitrite NEGATIVE NEGATIVE   Leukocytes, UA LARGE (A) NEGATIVE   RBC / HPF 0-5 0 - 5 RBC/hpf   WBC, UA TOO NUMEROUS TO COUNT 0 - 5 WBC/hpf   Bacteria, UA MANY (A) NONE SEEN   Squamous Epithelial / LPF 6-30 (A) NONE SEEN   Mucous PRESENT   Wet prep, genital     Status: Abnormal   Collection Time: 02/11/16  8:31 AM  Result Value Ref Range   Yeast Wet Prep HPF POC NONE SEEN NONE SEEN   Trich, Wet Prep NONE SEEN NONE SEEN   Clue Cells Wet Prep HPF POC NONE SEEN NONE SEEN   WBC, Wet Prep HPF POC FEW (A) NONE SEEN   Sperm NONE SEEN     MAU Course  Procedures Tylenol 1g  MDM Labs ordered and reviewed. No evidence of acute pelvic process. Will retreat for CT today with Azithro and Zofran. Stable for discharge home.   Assessment and Plan   1. Pelvic pain in pregnancy   2. Chlamydia    Discharge home Abstain from IC for 2 weeks Tylenol prn Heating pad prn  Allergies as of 02/11/2016      Reactions   Benadryl [diphenhydramine Hcl] Other (See Comments)   Red Man's and Sjogren's   Lorazepam    Patient states that it makes her "loopy" and "off the wall"   Vancomycin Itching   itching   Adhesive [tape] Dermatitis   hypofix tape only - itching      Medication List    TAKE these medications   prenatal multivitamin Tabs tablet Take 1 tablet by mouth daily at 12 noon.   promethazine 12.5 MG tablet Commonly known as:  PHENERGAN Take 1 tablet (12.5 mg total) by mouth every 6 (six) hours as needed for nausea or vomiting.      Donette Larry, CNM 02/11/2016, 8:13 AM

## 2016-02-11 NOTE — MAU Note (Signed)
Pt was here SAt and was treated for STD;

## 2016-02-18 ENCOUNTER — Encounter: Payer: Self-pay | Admitting: General Practice

## 2016-02-18 ENCOUNTER — Ambulatory Visit (HOSPITAL_COMMUNITY)
Admission: RE | Admit: 2016-02-18 | Discharge: 2016-02-18 | Disposition: A | Discharge status: Home / Self Care | Payer: Self-pay | Source: Ambulatory Visit | Attending: Obstetrics & Gynecology | Admitting: Obstetrics & Gynecology

## 2016-02-18 ENCOUNTER — Ambulatory Visit: Payer: Self-pay | Admitting: *Deleted

## 2016-02-18 DIAGNOSIS — O34531 Maternal care for retroversion of gravid uterus, first trimester: Secondary | ICD-10-CM | POA: Insufficient documentation

## 2016-02-18 DIAGNOSIS — Z712 Person consulting for explanation of examination or test findings: Secondary | ICD-10-CM

## 2016-02-18 DIAGNOSIS — O3680X0 Pregnancy with inconclusive fetal viability, not applicable or unspecified: Secondary | ICD-10-CM

## 2016-02-18 DIAGNOSIS — Z3A09 9 weeks gestation of pregnancy: Secondary | ICD-10-CM | POA: Insufficient documentation

## 2016-02-18 DIAGNOSIS — O283 Abnormal ultrasonic finding on antenatal screening of mother: Secondary | ICD-10-CM | POA: Insufficient documentation

## 2016-02-18 NOTE — Progress Notes (Addendum)
US confirms fetal viability today - 7w 1d.  EDD 10/05/16.  Medication reconciliation completed. Pt would like to receive prenatal care from this office

## 2016-03-04 ENCOUNTER — Inpatient Hospital Stay (HOSPITAL_COMMUNITY)
Admission: AD | Admit: 2016-03-04 | Discharge: 2016-03-04 | Disposition: A | Discharge status: Home / Self Care | Payer: Medicaid Other | Source: Ambulatory Visit | Attending: Obstetrics and Gynecology | Admitting: Obstetrics and Gynecology

## 2016-03-04 ENCOUNTER — Encounter (HOSPITAL_COMMUNITY): Payer: Self-pay

## 2016-03-04 DIAGNOSIS — Z3A09 9 weeks gestation of pregnancy: Secondary | ICD-10-CM | POA: Insufficient documentation

## 2016-03-04 DIAGNOSIS — R109 Unspecified abdominal pain: Secondary | ICD-10-CM | POA: Insufficient documentation

## 2016-03-04 DIAGNOSIS — O26891 Other specified pregnancy related conditions, first trimester: Secondary | ICD-10-CM | POA: Insufficient documentation

## 2016-03-04 LAB — URINALYSIS, ROUTINE W REFLEX MICROSCOPIC
BILIRUBIN URINE: NEGATIVE
GLUCOSE, UA: NEGATIVE mg/dL
Hgb urine dipstick: NEGATIVE
KETONES UR: 20 mg/dL — AB
NITRITE: NEGATIVE
PH: 6 (ref 5.0–8.0)
Protein, ur: NEGATIVE mg/dL
SPECIFIC GRAVITY, URINE: 1.017 (ref 1.005–1.030)

## 2016-03-04 NOTE — Discharge Instructions (Signed)
Abdominal Pain During Pregnancy °Belly (abdominal) pain is common during pregnancy. Most of the time, it is not a serious problem. Other times, it can be a sign that something is wrong with the pregnancy. Always tell your doctor if you have belly pain. °Follow these instructions at home: °Monitor your belly pain for any changes. The following actions may help you feel better: °· Do not have sex (intercourse) or put anything in your vagina until you feel better. °· Rest until your pain stops. °· Drink clear fluids if you feel sick to your stomach (nauseous). Do not eat solid food until you feel better. °· Only take medicine as told by your doctor. °· Keep all doctor visits as told. °Get help right away if: °· You are bleeding, leaking fluid, or pieces of tissue come out of your vagina. °· You have more pain or cramping. °· You keep throwing up (vomiting). °· You have pain when you pee (urinate) or have blood in your pee. °· You have a fever. °· You do not feel your baby moving as much. °· You feel very weak or feel like passing out. °· You have trouble breathing, with or without belly pain. °· You have a very bad headache and belly pain. °· You have fluid leaking from your vagina and belly pain. °· You keep having watery poop (diarrhea). °· Your belly pain does not go away after resting, or the pain gets worse. °This information is not intended to replace advice given to you by your health care provider. Make sure you discuss any questions you have with your health care provider. °Document Released: 12/21/2008 Document Revised: 08/11/2015 Document Reviewed: 08/01/2012 °Elsevier Interactive Patient Education © 2017 Elsevier Inc. ° °

## 2016-03-04 NOTE — MAU Note (Signed)
Signature pad not working. Paper signed and filed with chart.

## 2016-03-04 NOTE — MAU Note (Signed)
Been having pain in her crotch area.  Started last night, went away then came back again today- has continued all day. Denies GI or GU complaints

## 2016-03-04 NOTE — MAU Note (Signed)
Unable to urinate at this time. Pt to lobby

## 2016-03-04 NOTE — MAU Provider Note (Signed)
History    G3P2002   In with intermittant abd pain that has been going on for several days. Denies nausea, vomiting or diarrhea, denies vag bleeding or abnormal discharge. States she and partnes were both treated for Chla about a month ago.  CSN: [id number]  Arrival date & time 03/04/16  1646   None     Chief Complaint  Patient presents with  . Pelvic Pain    HPI  Past Medical History:  Diagnosis Date  . Anxiety   . Asthma   . Depression   . Migraine with aura 04/25/2012    Past Surgical History:  Procedure Laterality Date  . WISDOM TOOTH EXTRACTION      Family History  Problem Relation Age of Onset  . Diabetes Maternal Grandfather   . Hyperlipidemia Maternal Grandfather   . Stroke Maternal Grandfather   . Heart murmur Mother   . Miscarriages / [place] Mother   . Diabetes Mother   . Autism Paternal Uncle   . Down syndrome Paternal Uncle   . Stroke Maternal Grandmother     Social History  Substance Use Topics  . Smoking status: Never Smoker  . Smokeless tobacco: Never Used  . Alcohol use No    OB History    Gravida Para Term Preterm AB Living   3 2 2  0 0 2   SAB TAB Ectopic Multiple Live Births   0 0 0 0 2      Review of Systems  Constitutional: Negative.   HENT: Negative.   Eyes: Negative.   Respiratory: Negative.   Cardiovascular: Negative.   Gastrointestinal: Positive for abdominal pain.  Endocrine: Negative.   Genitourinary: Negative.   Musculoskeletal: Negative.   Skin: Negative.     Allergies  Benadryl [diphenhydramine hcl]; Lorazepam; Vancomycin; and Adhesive [tape]  Home Medications    BP 113/67 (BP Location: Right Arm)   Pulse 80   Temp 98.4 F (36.9 C) (Oral)   Resp 16   Wt 137 lb 3.2 oz (62.2 kg)   LMP 12/17/2015 (Approximate)   BMI 22.83 kg/m   Physical Exam  Constitutional: She is oriented to person, place, and time. She appears well-developed and well-nourished.  HENT:  Head: Normocephalic.  Neck: Normal range of  motion.  Cardiovascular: Normal rate, regular rhythm, normal heart sounds and intact distal pulses.   Pulmonary/Chest: Effort normal and breath sounds normal.  Abdominal: Soft. Bowel sounds are normal.  Genitourinary: Vagina normal and uterus normal.  Musculoskeletal: Normal range of motion.  Neurological: She is alert and oriented to person, place, and time. She has normal reflexes.  Skin: Skin is warm and dry.  Psychiatric: She has a normal mood and affect. Her behavior is normal. Judgment and thought content normal.    MAU Course  Procedures (including critical care time)  Labs Reviewed  URINALYSIS, ROUTINE W REFLEX MICROSCOPIC   No results found.   No diagnosis found.    MDM  abd pain in preg.  Bedside us shows viable IUP with strong FHR noted. Cervix cl/th/post. Will d/c home

## 2016-03-16 ENCOUNTER — Encounter: Payer: Self-pay | Admitting: Advanced Practice Midwife

## 2016-03-16 ENCOUNTER — Ambulatory Visit (INDEPENDENT_AMBULATORY_CARE_PROVIDER_SITE_OTHER): Payer: Medicaid Other | Admitting: Advanced Practice Midwife

## 2016-03-16 ENCOUNTER — Ambulatory Visit: Payer: Self-pay

## 2016-03-16 VITALS — BP 114/65 | HR 84 | Wt 138.3 lb

## 2016-03-16 DIAGNOSIS — O3680X0 Pregnancy with inconclusive fetal viability, not applicable or unspecified: Secondary | ICD-10-CM | POA: Diagnosis not present

## 2016-03-16 DIAGNOSIS — Z113 Encounter for screening for infections with a predominantly sexual mode of transmission: Secondary | ICD-10-CM

## 2016-03-16 DIAGNOSIS — Z23 Encounter for immunization: Secondary | ICD-10-CM | POA: Diagnosis not present

## 2016-03-16 DIAGNOSIS — Z3481 Encounter for supervision of other normal pregnancy, first trimester: Secondary | ICD-10-CM

## 2016-03-16 DIAGNOSIS — Z3689 Encounter for other specified antenatal screening: Secondary | ICD-10-CM

## 2016-03-16 DIAGNOSIS — Z3490 Encounter for supervision of normal pregnancy, unspecified, unspecified trimester: Secondary | ICD-10-CM | POA: Insufficient documentation

## 2016-03-16 DIAGNOSIS — Z3480 Encounter for supervision of other normal pregnancy, unspecified trimester: Secondary | ICD-10-CM

## 2016-03-16 LAB — POCT URINALYSIS DIP (DEVICE)
Bilirubin Urine: NEGATIVE
GLUCOSE, UA: NEGATIVE mg/dL
Hgb urine dipstick: NEGATIVE
KETONES UR: NEGATIVE mg/dL
NITRITE: NEGATIVE
PROTEIN: NEGATIVE mg/dL
Specific Gravity, Urine: 1.01 (ref 1.005–1.030)
Urobilinogen, UA: 0.2 mg/dL (ref 0.0–1.0)
pH: 6.5 (ref 5.0–8.0)

## 2016-03-16 NOTE — Progress Notes (Signed)
Prenatal education packet given Breastfeeding education done Flu vaccine todat Initial prenatal labs today Schedule first trimester screen

## 2016-03-16 NOTE — Progress Notes (Signed)
Pt informed that the ultrasound is considered a limited OB ultrasound and is not intended to be a complete ultrasound exam.  Patient also informed that the ultrasound is not being completed with the intent of assessing for fetal or placental anomalies or any pelvic abnormalities.  Explained that the purpose of today's ultrasound is to assess for viability.  Patient acknowledges the purpose of the exam and the limitations of the study.    Single IUP FHR - 164 bpm per M-mode FM present

## 2016-03-16 NOTE — Patient Instructions (Signed)

## 2016-03-16 NOTE — Progress Notes (Signed)
  Subjective:    Monica Munoz is being seen today for her first obstetrical visit. She is at 5432w0d gestation by 7 week US. Her obstetrical history is significant for Nothing however pt's last child died at 1511 months due to some sort of cyst in her throat. Pt had Hx of Depression. Reports stable mood now. Relationship with FOB: Involved. Patient does intend to breast feed. Pregnancy history fully reviewed.  Patient reports no complaints.  Review of Systems:   Review of Systems  Constitutional: Negative for chills and fever.  Gastrointestinal: Negative for abdominal pain, nausea and vomiting.  Genitourinary: Negative for vaginal bleeding and vaginal discharge.  Musculoskeletal: Negative for back pain.    Objective:     BP 114/65   Pulse 84   Wt 138 lb 4.8 oz (62.7 kg)   LMP 12/17/2015 (Approximate)   BMI 23.01 kg/m  Physical Exam  Nursing note and vitals reviewed. Constitutional: She is oriented to person, place, and time. She appears well-developed and well-nourished. No distress.  Eyes: Conjunctivae are normal. No scleral icterus.  Neck: No thyromegaly present.  Cardiovascular: Normal rate, regular rhythm and normal heart sounds.   Respiratory: Effort normal and breath sounds normal. No respiratory distress.  GI: She exhibits no distension. There is no tenderness.  Genitourinary: Vagina normal and uterus normal. No vaginal discharge found.  Musculoskeletal: She exhibits no edema or tenderness.  Neurological: She is alert and oriented to person, place, and time. She has normal reflexes.  Skin: Skin is warm and dry.  Psychiatric: She has a normal mood and affect.    Maternal Exam:  Introitus: Vagina is negative for discharge.     Nml FHR by US   Assessment:    Pregnancy: G3P2001 Patient Active Problem List   Diagnosis Date Noted  . Supervision of normal pregnancy, antepartum 03/16/2016  . Pelvic inflammatory disease 04/25/2015  . 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 Tachycardia 04/25/2015  . Acute PID (pelvic inflammatory disease) 04/25/2015  . Pelvic pain in female   . Leukocytosis   . Anxiety state, unspecified 05/26/2013  . Asthma, mild intermittent 05/26/2013  . Depression 05/26/2013  . Headache, migraine 01/29/2012      1. Supervision of other normal pregnancy, antepartum  - Prenatal Profile I - Cystic fibrosis diagnostic study - Hemoglobinopathy Evaluation - Culture, OB Urine - Cervicovaginal ancillary only - US MFM Fetal Nuchal Translucency; Future - Glucose Tolerance, 1 Hour  2. Needs flu shot  - Flu Vaccine QUAD 36+ mos IM (Fluarix, Quad PF)  3. Encounter to determine fetal viability of pregnancy, single or unspecified fetus  - US OB Limited  Plan:     Initial labs drawn. Prenatal vitamins. Problem list reviewed and updated. First trimester screen discussed: ordered. Role of ultrasound in pregnancy discussed; fetal survey: requested. Amniocentesis discussed: not indicated. Follow up in 4 weeks.   Dorathy KinsmanVirginia Zabdiel Dripps 03/16/2016

## 2016-03-17 LAB — PRENATAL PROFILE I(LABCORP)
Antibody Screen: NEGATIVE
Basophils Absolute: 0 10*3/uL (ref 0.0–0.2)
Basos: 0 %
EOS (ABSOLUTE): 0.4 10*3/uL (ref 0.0–0.4)
Eos: 3 %
HEMOGLOBIN: 11.3 g/dL (ref 11.1–15.9)
HEP B S AG: NEGATIVE
Hematocrit: 35.7 % (ref 34.0–46.6)
IMMATURE GRANS (ABS): 0 10*3/uL (ref 0.0–0.1)
IMMATURE GRANULOCYTES: 0 %
LYMPHS: 24 %
Lymphocytes Absolute: 2.8 10*3/uL (ref 0.7–3.1)
MCH: 28.8 pg (ref 26.6–33.0)
MCHC: 31.7 g/dL (ref 31.5–35.7)
MCV: 91 fL (ref 79–97)
MONOCYTES: 5 %
Monocytes Absolute: 0.6 10*3/uL (ref 0.1–0.9)
NEUTROS PCT: 68 %
Neutrophils Absolute: 7.6 10*3/uL — ABNORMAL HIGH (ref 1.4–7.0)
Platelets: 319 10*3/uL (ref 150–379)
RBC: 3.93 x10E6/uL (ref 3.77–5.28)
RDW: 16.1 % — ABNORMAL HIGH (ref 12.3–15.4)
RPR: NONREACTIVE
RUBELLA: 6.51 {index} (ref >0.99)
Rh Factor: POSITIVE
WBC: 11.4 10*3/uL — ABNORMAL HIGH (ref 3.4–10.8)

## 2016-03-17 LAB — HEMOGLOBINOPATHY EVALUATION
Ferritin: 23 ng/mL (ref 15–150)
HGB A: 97.5 % (ref 96.4–98.8)
HGB F QUANT: 0 % (ref 0.0–2.0)
HGB SOLUBILITY: NEGATIVE
Hgb A2 Quant: 2.5 % (ref 1.8–3.2)
Hgb C: 0 %
Hgb S: 0 %
Hgb Variant: 0 %

## 2016-03-17 LAB — CERVICOVAGINAL ANCILLARY ONLY
CHLAMYDIA, DNA PROBE: NEGATIVE
NEISSERIA GONORRHEA: NEGATIVE

## 2016-03-17 LAB — GLUCOSE TOLERANCE, 1 HOUR: Glucose, 1Hr PP: 84 mg/dL (ref 65–199)

## 2016-03-20 LAB — URINE CULTURE, OB REFLEX

## 2016-03-20 LAB — CULTURE, OB URINE

## 2016-03-29 ENCOUNTER — Ambulatory Visit (HOSPITAL_COMMUNITY)
Admission: RE | Admit: 2016-03-29 | Discharge: 2016-03-29 | Disposition: A | Discharge status: Home / Self Care | Payer: Medicaid Other | Source: Ambulatory Visit | Attending: Advanced Practice Midwife | Admitting: Advanced Practice Midwife

## 2016-03-29 ENCOUNTER — Encounter (HOSPITAL_COMMUNITY): Payer: Self-pay

## 2016-03-29 ENCOUNTER — Other Ambulatory Visit: Payer: Self-pay

## 2016-03-29 DIAGNOSIS — O99342 Other mental disorders complicating pregnancy, second trimester: Secondary | ICD-10-CM | POA: Diagnosis not present

## 2016-03-29 DIAGNOSIS — Z3682 Encounter for antenatal screening for nuchal translucency: Secondary | ICD-10-CM | POA: Insufficient documentation

## 2016-03-29 DIAGNOSIS — Z3480 Encounter for supervision of other normal pregnancy, unspecified trimester: Secondary | ICD-10-CM

## 2016-03-29 DIAGNOSIS — Z3A12 12 weeks gestation of pregnancy: Secondary | ICD-10-CM | POA: Diagnosis not present

## 2016-04-13 ENCOUNTER — Ambulatory Visit (INDEPENDENT_AMBULATORY_CARE_PROVIDER_SITE_OTHER): Payer: Medicaid Other | Admitting: Advanced Practice Midwife

## 2016-04-13 ENCOUNTER — Encounter: Payer: Self-pay | Admitting: Family Medicine

## 2016-04-13 ENCOUNTER — Encounter: Payer: Self-pay | Admitting: Advanced Practice Midwife

## 2016-04-13 VITALS — BP 110/75 | HR 65 | Wt 142.2 lb

## 2016-04-13 DIAGNOSIS — Z3480 Encounter for supervision of other normal pregnancy, unspecified trimester: Secondary | ICD-10-CM

## 2016-04-13 DIAGNOSIS — O09292 Supervision of pregnancy with other poor reproductive or obstetric history, second trimester: Secondary | ICD-10-CM

## 2016-04-13 DIAGNOSIS — Z3A18 18 weeks gestation of pregnancy: Secondary | ICD-10-CM

## 2016-04-13 DIAGNOSIS — Z8279 Family history of other congenital malformations, deformations and chromosomal abnormalities: Secondary | ICD-10-CM | POA: Diagnosis not present

## 2016-04-13 DIAGNOSIS — Z363 Encounter for antenatal screening for malformations: Secondary | ICD-10-CM

## 2016-04-13 NOTE — Patient Instructions (Signed)

## 2016-04-13 NOTE — Progress Notes (Signed)
   PRENATAL VISIT NOTE  Subjective:  Monica Munoz is a 26 y.o. G3P2001 at 6765w0d being seen today for ongoing prenatal care.  She is currently monitored for the following issues for this low-risk pregnancy and has Anxiety state, unspecified; Asthma, mild intermittent; Depression; Headache, migraine; Pelvic inflammatory disease; Fitz-Hugh-Curtis syndrome; Tachycardia; Pelvic pain in female; Supervision of normal pregnancy, antepartum; and Death of infant on her problem list.  Patient reports occassional sharp pain in bilat groin. No VB, LOF , urinary complaints, or discharage. .  Contractions: Not present. Vag. Bleeding: None.  Movement: Absent. Denies leaking of fluid.   The following portions of the patient's history were reviewed and updated as appropriate: allergies, current medications, past family history, past medical history, past social history, past surgical history and problem list. Problem list updated.  Objective:   Vitals:   03/28/2016 0946  BP: 110/75  Pulse: 65  Weight: 142 lb 3.2 oz (64.5 kg)    Fetal Status: Fetal Heart Rate (bpm): 150 Fundal Height: 16 cm Movement: Absent     General:  Alert, oriented and cooperative. Patient is in no acute distress.  Skin: Skin is warm and dry. No rash noted.   Cardiovascular: Normal heart rate noted  Respiratory: Normal respiratory effort, no problems with respiration noted  Abdomen: Soft, gravid, appropriate for gestational age. Pain/Pressure: Present     Pelvic:  Cervical exam deferred        Extremities: Normal range of motion.  Edema: None  Mental Status: Normal mood and affect. Normal behavior. Normal judgment and thought content.   Assessment and Plan:  Pregnancy: G3P2001 at 7965w0d  1. Antenatal screening for malformation using ultrasonics  - US MFM OB DETAIL +14 WK; Future  2. [redacted] weeks gestation of pregnancy  - US MFM OB DETAIL +14 WK; Future  3. Supervision of other normal pregnancy, antepartum   4. Hx Death of  infant from cyst in throat. Unknown if congenital or acquired.  - MFM US  5. Round ligament pains - Comfort measures  Preterm labor symptoms and general obstetric precautions including but not limited to vaginal bleeding, contractions, leaking of fluid and fetal movement were reviewed in detail with the patient. Please refer to After Visit Summary for other counseling recommendations.  Return in about 4 weeks (around 05/11/2016) for ROB/AFP.   Monica Munoz, CNM

## 2016-04-16 DIAGNOSIS — 419620001 Death: Secondary | SNOMED CT | POA: Insufficient documentation

## 2016-04-16 DEATH — deceased

## 2016-04-27 ENCOUNTER — Inpatient Hospital Stay (HOSPITAL_COMMUNITY)
Admission: AD | Admit: 2016-04-27 | Discharge: 2016-04-28 | Disposition: A | Discharge status: Home / Self Care | Payer: Medicaid Other | Source: Ambulatory Visit | Attending: Obstetrics & Gynecology | Admitting: Obstetrics & Gynecology

## 2016-04-27 DIAGNOSIS — R102 Pelvic and perineal pain: Secondary | ICD-10-CM | POA: Insufficient documentation

## 2016-04-27 DIAGNOSIS — O98812 Other maternal infectious and parasitic diseases complicating pregnancy, second trimester: Secondary | ICD-10-CM | POA: Insufficient documentation

## 2016-04-27 DIAGNOSIS — Z3A17 17 weeks gestation of pregnancy: Secondary | ICD-10-CM | POA: Insufficient documentation

## 2016-04-27 DIAGNOSIS — Z3480 Encounter for supervision of other normal pregnancy, unspecified trimester: Secondary | ICD-10-CM

## 2016-04-27 DIAGNOSIS — Z79899 Other long term (current) drug therapy: Secondary | ICD-10-CM | POA: Insufficient documentation

## 2016-04-27 DIAGNOSIS — N949 Unspecified condition associated with female genital organs and menstrual cycle: Secondary | ICD-10-CM

## 2016-04-27 DIAGNOSIS — Z881 Allergy status to other antibiotic agents status: Secondary | ICD-10-CM | POA: Insufficient documentation

## 2016-04-27 DIAGNOSIS — O26892 Other specified pregnancy related conditions, second trimester: Secondary | ICD-10-CM | POA: Insufficient documentation

## 2016-04-27 DIAGNOSIS — B373 Candidiasis of vulva and vagina: Secondary | ICD-10-CM | POA: Insufficient documentation

## 2016-04-27 DIAGNOSIS — B3731 Acute candidiasis of vulva and vagina: Secondary | ICD-10-CM

## 2016-04-28 ENCOUNTER — Encounter (HOSPITAL_COMMUNITY): Payer: Self-pay | Admitting: *Deleted

## 2016-04-28 DIAGNOSIS — B373 Candidiasis of vulva and vagina: Secondary | ICD-10-CM | POA: Diagnosis not present

## 2016-04-28 DIAGNOSIS — R1032 Left lower quadrant pain: Secondary | ICD-10-CM | POA: Diagnosis present

## 2016-04-28 DIAGNOSIS — Z3A17 17 weeks gestation of pregnancy: Secondary | ICD-10-CM | POA: Diagnosis not present

## 2016-04-28 DIAGNOSIS — O9989 Other specified diseases and conditions complicating pregnancy, childbirth and the puerperium: Secondary | ICD-10-CM | POA: Diagnosis not present

## 2016-04-28 DIAGNOSIS — Z881 Allergy status to other antibiotic agents status: Secondary | ICD-10-CM | POA: Diagnosis not present

## 2016-04-28 DIAGNOSIS — R102 Pelvic and perineal pain: Secondary | ICD-10-CM | POA: Diagnosis not present

## 2016-04-28 DIAGNOSIS — O98812 Other maternal infectious and parasitic diseases complicating pregnancy, second trimester: Secondary | ICD-10-CM | POA: Diagnosis not present

## 2016-04-28 DIAGNOSIS — R109 Unspecified abdominal pain: Secondary | ICD-10-CM

## 2016-04-28 DIAGNOSIS — O26892 Other specified pregnancy related conditions, second trimester: Secondary | ICD-10-CM | POA: Diagnosis not present

## 2016-04-28 DIAGNOSIS — Z79899 Other long term (current) drug therapy: Secondary | ICD-10-CM | POA: Diagnosis not present

## 2016-04-28 LAB — URINALYSIS, ROUTINE W REFLEX MICROSCOPIC
BILIRUBIN URINE: NEGATIVE
Glucose, UA: NEGATIVE mg/dL
Hgb urine dipstick: NEGATIVE
KETONES UR: 80 mg/dL — AB
Nitrite: NEGATIVE
Protein, ur: 30 mg/dL — AB
Specific Gravity, Urine: 1.017 (ref 1.005–1.030)
pH: 7 (ref 5.0–8.0)

## 2016-04-28 LAB — WET PREP, GENITAL
Sperm: NONE SEEN
TRICH WET PREP: NONE SEEN

## 2016-04-28 MED ORDER — TERCONAZOLE 0.4 % VA CREA: 1.0000 | TOPICAL_CREAM | Freq: Every day | VAGINAL | 0 refills | Status: DC

## 2016-04-28 NOTE — MAU Provider Note (Signed)
History     CSN: [id number]  Arrival date and time: 04/27/16 2335   First Provider Initiated Contact with Patient 04/28/16 0037      Chief Complaint  Patient presents with  . Abdominal Pain   Abdominal Pain  This is a new problem. The current episode started 1 to 4 weeks ago. The problem occurs intermittently. The problem has been gradually worsening. The pain is located in the LLQ. The pain is at a severity of 6/10. Quality: "feels like somthing in pulling" The abdominal pain does not radiate. Associated symptoms include nausea ("normal for this pregnancy"). Pertinent negatives include no constipation, diarrhea, dysuria, fever, frequency or vomiting. The pain is aggravated by movement. The pain is relieved by being still. She has tried nothing for the symptoms.   Past Medical History:  Diagnosis Date  . Anxiety   . Asthma   . Depression   . Migraine with aura 04/25/2012    Past Surgical History:  Procedure Laterality Date  . WISDOM TOOTH EXTRACTION      Family History  Problem Relation Age of Onset  . Diabetes Maternal Grandfather   . Hyperlipidemia Maternal Grandfather   . Stroke Maternal Grandfather   . Heart murmur Mother   . Miscarriages / [place] Mother   . Diabetes Mother   . Autism Paternal Uncle   . Down syndrome Paternal Uncle   . Stroke Maternal Grandmother     Social History  Substance Use Topics  . Smoking status: Never Smoker  . Smokeless tobacco: Never Used  . Alcohol use No    Allergies:  Allergies  Allergen Reactions  . Benadryl [Diphenhydramine Hcl] Other (See Comments)    Red Man's and Sjogren's  . Lorazepam     Patient states that it makes her "loopy" and "off the wall"  . Vancomycin Itching    itching  . Adhesive [Tape] Dermatitis    hypofix tape only - itching    Prescriptions Prior to Admission  Medication Sig Dispense Refill Last Dose  . Prenatal Vit-Fe Fumarate-FA (PRENATAL MULTIVITAMIN) TABS tablet Take 1 tablet by mouth  daily at 12 noon.   Taking  . promethazine (PHENERGAN) 12.5 MG tablet Take 1 tablet (12.5 mg total) by mouth every 6 (six) hours as needed for nausea or vomiting. (Patient not taking: Reported on 03/16/2016) 30 tablet 0 Not Taking    Review of Systems  Constitutional: Negative for chills and fever.  Gastrointestinal: Positive for abdominal pain and nausea ("normal for this pregnancy"). Negative for constipation, diarrhea and vomiting.  Genitourinary: Positive for vaginal pain. Negative for dysuria, frequency, urgency, vaginal bleeding and vaginal discharge.   Physical Exam   Blood pressure 112/67, pulse 89, temperature 98.6 F (37 C), temperature source Oral, resp. rate 18, height  (1.651 m), weight 63.2 kg (139 lb 4 oz), last menstrual period 12/17/2015, unknown if currently breastfeeding.  Physical Exam  Nursing note and vitals reviewed. Constitutional: She is oriented to person, place, and time. She appears well-developed and well-nourished. No distress.  HENT:  Head: Normocephalic.  Cardiovascular: Normal rate.   Respiratory: Effort normal.  GI: Soft. There is no tenderness. There is no rebound.  Genitourinary:  Genitourinary Comments:  External: no lesion Vagina: large amount of thick white discharge  Cervix: pink, smooth, no CMT, closed/thick  Uterus: AGA, FHT 154 with doppler    Neurological: She is alert and oriented to person, place, and time.  Skin: Skin is warm and dry.  Psychiatric: She has a normal  mood and affect.   Results for orders placed or performed during the hospital encounter of 04/27/16 (from the past 24 hour(s))  Urinalysis, Routine w reflex microscopic     Status: Abnormal   Collection Time: 04/28/16 12:08 AM  Result Value Ref Range   Color, Urine YELLOW YELLOW   APPearance TURBID (A) CLEAR   Specific Gravity, Urine 1.017 1.005 - 1.030   pH 7.0 5.0 - 8.0   Glucose, UA NEGATIVE NEGATIVE mg/dL   Hgb urine dipstick NEGATIVE NEGATIVE   Bilirubin  Urine NEGATIVE NEGATIVE   Ketones, ur 80 (A) NEGATIVE mg/dL   Protein, ur 30 (A) NEGATIVE mg/dL   Nitrite NEGATIVE NEGATIVE   Leukocytes, UA LARGE (A) NEGATIVE   RBC / HPF 0-5 0 - 5 RBC/hpf   WBC, UA TOO NUMEROUS TO COUNT 0 - 5 WBC/hpf   Bacteria, UA RARE (A) NONE SEEN   Squamous Epithelial / LPF 6-30 (A) NONE SEEN   Mucous PRESENT   Wet prep, genital     Status: Abnormal   Collection Time: 04/28/16 12:46 AM  Result Value Ref Range   Yeast Wet Prep HPF POC PRESENT (A) NONE SEEN   Trich, Wet Prep NONE SEEN NONE SEEN   Clue Cells Wet Prep HPF POC PRESENT (A) NONE SEEN   WBC, Wet Prep HPF POC TOO NUMEROUS TO COUNT (A) NONE SEEN   Sperm NONE SEEN     MAU Course  Procedures  MDM   Assessment and Plan   1. Candidiasis of vulva and vagina   2. Supervision of other normal pregnancy, antepartum   3. Round ligament pain   4. [redacted] weeks gestation of pregnancy    DC home Comfort measures reviewed  2nd/3rd Trimester precautions  Fetal kick counts RX: terazol 7 daily for 7 days  Return to MAU as needed FU with OB as planned  Follow-up Information    [facility] [place] [facility] Follow up.   Specialty:  Obstetrics and Gynecology Contact information: [address], [address] [place] [address] [phone]           Tawnya Crook 04/28/2016, 12:43 AM

## 2016-04-28 NOTE — Discharge Instructions (Signed)
Vaginal Yeast infection, Adult Vaginal yeast infection is a condition that causes soreness, swelling, and redness (inflammation) of the vagina. It also causes vaginal discharge. This is a common condition. Some women get this infection frequently. What are the causes? This condition is caused by a change in the normal balance of the yeast (candida) and bacteria that live in the vagina. This change causes an overgrowth of yeast, which causes the inflammation. What increases the risk? This condition is more likely to develop in:  Women who take antibiotic medicines.  Women who have diabetes.  Women who take birth control pills.  Women who are pregnant.  Women who douche often.  Women who have a weak defense (immune) system.  Women who have been taking steroid medicines for a long time.  Women who frequently wear tight clothing. What are the signs or symptoms? Symptoms of this condition include:  White, thick vaginal discharge.  Swelling, itching, redness, and irritation of the vagina. The lips of the vagina (vulva) may be affected as well.  Pain or a burning feeling while urinating.  Pain during sex. How is this diagnosed? This condition is diagnosed with a medical history and physical exam. This will include a pelvic exam. Your health care provider will examine a sample of your vaginal discharge under a microscope. Your health care provider may send this sample for testing to confirm the diagnosis. How is this treated? This condition is treated with medicine. Medicines may be over-the-counter or prescription. You may be told to use one or more of the following:  Medicine that is taken orally.  Medicine that is applied as a cream.  Medicine that is inserted directly into the vagina (suppository). Follow these instructions at home:  Take or apply over-the-counter and prescription medicines only as told by your health care provider.  Do not have sex until your health care  provider has approved. Tell your sex partner that you have a yeast infection. That person should go to his or her health care provider if he or she develops symptoms.  Do not wear tight clothes, such as pantyhose or tight pants.  Avoid using tampons until your health care provider approves.  Eat more yogurt. This may help to keep your yeast infection from returning.  Try taking a sitz bath to help with discomfort. This is a warm water bath that is taken while you are sitting down. The water should only come up to your hips and should cover your buttocks. Do this 3-4 times per day or as told by your health care provider.  Do not douche.  Wear breathable, cotton underwear.  If you have diabetes, keep your blood sugar levels under control. Contact a health care provider if:  You have a fever.  Your symptoms go away and then return.  Your symptoms do not get better with treatment.  Your symptoms get worse.  You have new symptoms.  You develop blisters in or around your vagina.  You have blood coming from your vagina and it is not your menstrual period.  You develop pain in your abdomen. This information is not intended to replace advice given to you by your health care provider. Make sure you discuss any questions you have with your health care provider. Document Released: 10/12/2004 Document Revised: 06/16/2015 Document Reviewed: 07/06/2014 Elsevier Interactive Patient Education  2017 [employer]  Round Ligament Pain during Pregnancy Many women will experience a type of pain referred to as round ligament pain during their pregnancy. This  is associated with abdominal pain or discomfort. Since any type of abdominal pain during pregnancy can be disconcerting, it is important to talk about round ligament pain to relieve any anxiety or fears you may have regarding the symptoms you are feeling. Round ligament pain is due to normal changes that take place in the body during  pregnancy. It is caused by stretching of the round ligaments attached to the uterus. More commonly it occurs on the right side of the pelvis. Round Ligament: An Overview Typically in the non-pregnant state the uterus is about the size of an apple or pear. There are thick ligaments which hold the uterus in place in the abdomen, referred to as round ligaments. During pregnancy, your uterus will expand in size and weight, and the ligaments supporting it will have to stretch, becoming longer and thinner. As these ligaments pull and tug they may irritate nearby nerve fibers, which causes pain. The severity of the pain in some cases can seem extreme. Some common symptoms of round ligament pain include:  Ligament spasms or contractions/cramps that trigger a sharp pain typically on the right side of the abdomen.  Pain upon waking or suddenly rolling over in your sleep.  Pain in the abdomen that is sharp brought on by exercise or other vigorous activity. Similar Problems Round ligament pain is often mistaken for other medical conditions because the symptoms are similar. Acute abdominal pain during pregnancy may also be a sign of other conditions including:  Abdominal cramps - Some abdominal pain is simply caused by change in bowel habits associated with pregnancy. Gas is a common problem that can cause sharp, shooting pain. You should always seek out medical care if your pain is accompanied by fever, chills, pain upon urination or if you have difficulty walking. Further exams and tests will be conducted to ensure that you do not have a more serious condition. It is not uncommon for women with lower abdominal pain to have a urinary tract infection, thus you may also be asked for a urine sample. Treatment If all other conditions are ruled out you can treat your round ligament pain relatively easily. You may be advised to take some acetaminophen (Tylenol) to reduce the severity of any  persistent pain and asked to reduce your activity level. You can apply a heating pad to the area of pain or take a warm bath. Lying on the opposite side of the pain may help as well. Most women will find relief from round ligament pain simply by altering their daily routines slightly. The good news is round ligament pain will disappear completely once you have given birth to your child!

## 2016-04-28 NOTE — MAU Note (Signed)
PT  SAYS SHE HAS TUGGING  PAIN ON LEFT LOWER ABD  - FEELS  SOMETHING  PULLING   -  STARTED YESTERDAY.     HER VAGINA  HURTS- FEELS  PULLING  PAIN-    STARTED   TODAY.   PNC-  IN CLINIC      LAST SEX-   1 WEEK  AGO.

## 2016-04-29 LAB — CULTURE, OB URINE

## 2016-05-01 LAB — GC/CHLAMYDIA PROBE AMP (~~LOC~~) NOT AT ARMC
Chlamydia: NEGATIVE
NEISSERIA GONORRHEA: NEGATIVE

## 2016-05-06 ENCOUNTER — Inpatient Hospital Stay (HOSPITAL_COMMUNITY)
Admission: AD | Admit: 2016-05-06 | Discharge: 2016-05-06 | Disposition: A | Discharge status: Home / Self Care | Payer: Medicaid Other | Source: Ambulatory Visit | Attending: Obstetrics and Gynecology | Admitting: Obstetrics and Gynecology

## 2016-05-06 DIAGNOSIS — O219 Vomiting of pregnancy, unspecified: Secondary | ICD-10-CM | POA: Diagnosis not present

## 2016-05-06 DIAGNOSIS — F419 Anxiety disorder, unspecified: Secondary | ICD-10-CM | POA: Insufficient documentation

## 2016-05-06 DIAGNOSIS — O99342 Other mental disorders complicating pregnancy, second trimester: Secondary | ICD-10-CM | POA: Diagnosis not present

## 2016-05-06 DIAGNOSIS — O99512 Diseases of the respiratory system complicating pregnancy, second trimester: Secondary | ICD-10-CM | POA: Diagnosis not present

## 2016-05-06 DIAGNOSIS — Z3A18 18 weeks gestation of pregnancy: Secondary | ICD-10-CM | POA: Insufficient documentation

## 2016-05-06 DIAGNOSIS — O99612 Diseases of the digestive system complicating pregnancy, second trimester: Secondary | ICD-10-CM | POA: Insufficient documentation

## 2016-05-06 DIAGNOSIS — K219 Gastro-esophageal reflux disease without esophagitis: Secondary | ICD-10-CM | POA: Insufficient documentation

## 2016-05-06 DIAGNOSIS — Z881 Allergy status to other antibiotic agents status: Secondary | ICD-10-CM | POA: Insufficient documentation

## 2016-05-06 DIAGNOSIS — F329 Major depressive disorder, single episode, unspecified: Secondary | ICD-10-CM | POA: Insufficient documentation

## 2016-05-06 DIAGNOSIS — Z79899 Other long term (current) drug therapy: Secondary | ICD-10-CM | POA: Diagnosis not present

## 2016-05-06 DIAGNOSIS — J45909 Unspecified asthma, uncomplicated: Secondary | ICD-10-CM | POA: Diagnosis not present

## 2016-05-06 DIAGNOSIS — O210 Mild hyperemesis gravidarum: Secondary | ICD-10-CM | POA: Insufficient documentation

## 2016-05-06 LAB — URINALYSIS, ROUTINE W REFLEX MICROSCOPIC
Bilirubin Urine: NEGATIVE
Glucose, UA: NEGATIVE mg/dL
Hgb urine dipstick: NEGATIVE
Ketones, ur: 80 mg/dL — AB
Nitrite: NEGATIVE
PROTEIN: 100 mg/dL — AB
SPECIFIC GRAVITY, URINE: 1.026 (ref 1.005–1.030)
pH: 6 (ref 5.0–8.0)

## 2016-05-06 MED ORDER — FAMOTIDINE IN NACL 20-0.9 MG/50ML-% IV SOLN
20.0000 mg | Freq: Once | INTRAVENOUS | Status: AC
  Administered 2016-05-06: 20 mg via INTRAVENOUS
  Filled 2016-05-06: qty 50

## 2016-05-06 MED ORDER — M.V.I. ADULT IV INJ
Freq: Once | INTRAVENOUS | Status: AC
  Administered 2016-05-06: 16:00:00 via INTRAVENOUS
  Filled 2016-05-06: qty 1000

## 2016-05-06 MED ORDER — SODIUM CHLORIDE 0.9 % IV SOLN
8.0000 mg | Freq: Once | INTRAVENOUS | Status: AC
  Administered 2016-05-06: 8 mg via INTRAVENOUS
  Filled 2016-05-06: qty 4

## 2016-05-06 MED ORDER — METOCLOPRAMIDE HCL 5 MG/ML IJ SOLN
10.0000 mg | Freq: Once | INTRAMUSCULAR | Status: AC
  Administered 2016-05-06: 10 mg via INTRAVENOUS
  Filled 2016-05-06: qty 2

## 2016-05-06 MED ORDER — DEXTROSE 5 % IN LACTATED RINGERS IV BOLUS
1000.0000 mL | Freq: Once | INTRAVENOUS | Status: AC
  Administered 2016-05-06: 1000 mL via INTRAVENOUS

## 2016-05-06 MED ORDER — RANITIDINE HCL 150 MG PO TABS: 150.0000 mg | ORAL_TABLET | Freq: Two times a day (BID) | ORAL | 1 refills | Status: DC

## 2016-05-06 MED ORDER — ONDANSETRON 4 MG PO TBDP: 4.0000 mg | ORAL_TABLET | Freq: Three times a day (TID) | ORAL | 1 refills | Status: DC | PRN

## 2016-05-06 NOTE — MAU Note (Signed)
Pt c/o discomfort in her chest, seeing sparkles, resp even and unlabored but rapid. Pulse ox applied. Venia Carbon NP called to room. O2 started via face mask @ 10 LPM. 1615- Pt calmed down resp 18, o2 sat 100%, denies further chest pain. Pt on room air at present.

## 2016-05-06 NOTE — MAU Note (Signed)
Pt complains of increase in nausea and vomiting over last week. Denies LOF and vaginal bleeding. Denies use of meds to help wuth nausea.

## 2016-05-06 NOTE — Discharge Instructions (Signed)
Nausea and Vomiting, Adult Feeling sick to your stomach (nausea) means that your stomach is upset or you feel like you have to throw up (vomit). Feeling more and more sick to your stomach can lead to throwing up. Throwing up happens when food and liquid from your stomach are thrown up and out the mouth. Throwing up can make you feel weak and cause you to get dehydrated. Dehydration can make you tired and thirsty, make you have a dry mouth, and make it so you pee (urinate) less often. Older adults and people with other diseases or a weak defense system (immune system) are at higher risk for dehydration. If you feel sick to your stomach or if you throw up, it is important to follow instructions from your doctor about how to take care of yourself. Follow these instructions at home: Eating and drinking  Follow these instructions as told by your doctor:  Take an oral rehydration solution (ORS). This is a drink that is sold at pharmacies and stores.  Drink clear fluids in small amounts as you are able, such as:  Water.  Ice chips.  Diluted fruit juice.  Low-calorie sports drinks.  Eat bland, easy-to-digest foods in small amounts as you are able, such as:  Bananas.  Applesauce.  Rice.  Low-fat (lean) meats.  Toast.  Crackers.  Avoid fluids that have a lot of sugar or caffeine in them.  Avoid alcohol.  Avoid spicy or fatty foods. General instructions   Drink enough fluid to keep your pee (urine) clear or pale yellow.  Wash your hands often. If you cannot use soap and water, use hand sanitizer.  Make sure that all people in your home wash their hands well and often.  Take over-the-counter and prescription medicines only as told by your doctor.  Rest at home while you get better.  Watch your condition for any changes.  Breathe slowly and deeply when you feel sick to your stomach.  Keep all follow-up visits as told by your doctor. This is important. Contact a doctor  if:  You have a fever.  You cannot keep fluids down.  Your symptoms get worse.  You have new symptoms.  You feel sick to your stomach for more than two days.  You feel light-headed or dizzy.  You have a headache.  You have muscle cramps. Get help right away if:  You have pain in your chest, neck, arm, or jaw.  You feel very weak or you pass out (faint).  You throw up again and again.  You see blood in your throw-up.  Your throw-up looks like black coffee grounds.  You have bloody or black poop (stools) or poop that look like tar.  You have a very bad headache, a stiff neck, or both.  You have a rash.  You have very bad pain, cramping, or bloating in your belly (abdomen).  You have trouble breathing.  You are breathing very quickly.  Your heart is beating very quickly.  Your skin feels cold and clammy.  You feel confused.  You have pain when you pee.  You have signs of dehydration, such as:  Dark pee, hardly any pee, or no pee.  Cracked lips.  Dry mouth.  Sunken eyes.  Sleepiness.  Weakness. These symptoms may be an emergency. Do not wait to see if the symptoms will go away. Get medical help right away. Call your local emergency services (911 in the [place] Do not drive yourself to the hospital. This information   is not intended to replace advice given to you by your health care provider. Make sure you discuss any questions you have with your health care provider. Document Released: 06/21/2007 Document Revised: 07/23/2015 Document Reviewed: 09/08/2014 Elsevier Interactive Patient Education  2017 Elsevier Inc.  

## 2016-05-06 NOTE — MAU Provider Note (Signed)
History     CSN: [id number]  Arrival date and time: 05/06/16 1400   First Provider Initiated Contact with Patient 05/06/16 1456      Chief Complaint  Patient presents with  . Morning Sickness  . Emesis During Pregnancy   HPI   Ms.Monica Munoz is a 26 y.o. female G3P2001 @ [redacted]w[redacted]d here in MAU with N/V. She is receiving her care in the Clinic. She has vomited 9 times in the last 24 hours. She feel dehydrated. N/V has been an ongoing problem in this pregnancy. She is currently not taking anything for the symptoms.   OB History    Gravida Para Term Preterm AB Living   0 0 1   SAB TAB Ectopic Multiple Live Births   0 0 0 0 2      Obstetric Comments   Deceased at 11 months from cyst in throat.       Past Medical History:  Diagnosis Date  . Anxiety   . Asthma   . Depression   . Migraine with aura 04/25/2012    Past Surgical History:  Procedure Laterality Date  . WISDOM TOOTH EXTRACTION      Family History  Problem Relation Age of Onset  . Diabetes Maternal Grandfather   . Hyperlipidemia Maternal Grandfather   . Stroke Maternal Grandfather   . Heart murmur Mother   . Miscarriages / [place] Mother   . Diabetes Mother   . Autism Paternal Uncle   . Down syndrome Paternal Uncle   . Stroke Maternal Grandmother     Social History  Substance Use Topics  . Smoking status: Never Smoker  . Smokeless tobacco: Never Used  . Alcohol use No    Allergies:  Allergies  Allergen Reactions  . Benadryl [Diphenhydramine Hcl] Other (See Comments)    Red Man's and Sjogren's  . Lorazepam     Patient states that it makes her "loopy" and "off the wall"  . Vancomycin Itching    itching  . Adhesive [Tape] Dermatitis    hypofix tape only - itching    Prescriptions Prior to Admission  Medication Sig Dispense Refill Last Dose  . Prenatal Vit-Fe Fumarate-FA (PRENATAL MULTIVITAMIN) TABS tablet Take 1 tablet by mouth daily at 12 noon.   Taking  . promethazine (PHENERGAN)  12.5 MG tablet Take 1 tablet (12.5 mg total) by mouth every 6 (six) hours as needed for nausea or vomiting. (Patient not taking: Reported on 03/16/2016) 30 tablet 0 Not Taking  . terconazole (TERAZOL 7) 0.4 % vaginal cream Place 1 applicator vaginally at bedtime. 45 g 0    Results for orders placed or performed during the hospital encounter of 05/06/16 (from the past 48 hour(s))  Urinalysis, Routine w reflex microscopic     Status: Abnormal   Collection Time: 05/06/16  2:20 PM  Result Value Ref Range   Color, Urine AMBER (A) YELLOW    Comment: BIOCHEMICALS MAY BE AFFECTED BY COLOR   APPearance CLOUDY (A) CLEAR   Specific Gravity, Urine 1.026 1.005 - 1.030   pH 6.0 5.0 - 8.0   Glucose, UA NEGATIVE NEGATIVE mg/dL   Hgb urine dipstick NEGATIVE NEGATIVE   Bilirubin Urine NEGATIVE NEGATIVE   Ketones, ur 80 (A) NEGATIVE mg/dL   Protein, ur 782 (A) NEGATIVE mg/dL   Nitrite NEGATIVE NEGATIVE   Leukocytes, UA MODERATE (A) NEGATIVE   RBC / HPF 0-5 0 - 5 RBC/hpf   WBC, UA 6-30 0 - 5 WBC/hpf  Bacteria, UA RARE (A) NONE SEEN   Squamous Epithelial / LPF 6-30 (A) NONE SEEN   Mucous PRESENT    Review of Systems  Gastrointestinal: Positive for abdominal pain (Occasional ), nausea and vomiting.  Genitourinary: Negative for vaginal bleeding.   Physical Exam   Blood pressure (!) 111/59, pulse 73, temperature 98.4 F (36.9 C), temperature source Oral, resp. rate 18, height  (1.651 m), weight 136 lb (61.7 kg), last menstrual period 12/17/2015, unknown if currently breastfeeding.  Physical Exam  Constitutional: She is oriented to person, place, and time. She appears well-developed and well-nourished. No distress.  HENT:  Head: Normocephalic.  Eyes: Pupils are equal, round, and reactive to light.  Musculoskeletal: Normal range of motion.  Neurological: She is alert and oriented to person, place, and time.  Skin: Skin is warm. She is not diaphoretic. There is pallor.  Psychiatric: Her behavior  is normal.   MAU Course  Procedures  None  MDM  + fetal heart tones via doppler  D5LR bolus x 1 liter  MVI bolus x 1 liter  Reglan 10 mg IV Zofran IV Pepcid IV  45 minutes after Reglan was given patient became diaphoretic, clammy, and chest tightness. Patient felt burning it her chest and felt she could not catch her breath. EKG done; WNL, read by Dr. Omer Jack.  O2 saturation 100 %, BP WNL, 10 liter of O2 placed for comfort. Symptoms lasted about 10 minutes then resolved, likely a panic attack, possibly from reglan administration.   Assessment and Plan   A:  1. Nausea and vomiting in pregnancy   2. Gastroesophageal reflux disease, esophagitis presence not specified     P:  Discharge home in stable condition Rx: Zantac, Zofran Return to MAU as needed, if symptoms worsen Follow up in the [facility] as scheduled or sooner if needed Small, frequent meals.    Duane Lope, NP 05/08/2016 1:44 PM

## 2016-05-08 ENCOUNTER — Inpatient Hospital Stay (HOSPITAL_COMMUNITY)
Admission: AD | Admit: 2016-05-08 | Discharge: 2016-05-08 | Disposition: A | Discharge status: Home / Self Care | Payer: Medicaid Other | Source: Ambulatory Visit | Attending: Obstetrics & Gynecology | Admitting: Obstetrics & Gynecology

## 2016-05-08 ENCOUNTER — Encounter (HOSPITAL_COMMUNITY): Payer: Self-pay

## 2016-05-08 DIAGNOSIS — B3731 Acute candidiasis of vulva and vagina: Secondary | ICD-10-CM

## 2016-05-08 DIAGNOSIS — Z79899 Other long term (current) drug therapy: Secondary | ICD-10-CM | POA: Diagnosis not present

## 2016-05-08 DIAGNOSIS — B9689 Other specified bacterial agents as the cause of diseases classified elsewhere: Secondary | ICD-10-CM | POA: Diagnosis not present

## 2016-05-08 DIAGNOSIS — B373 Candidiasis of vulva and vagina: Secondary | ICD-10-CM | POA: Diagnosis not present

## 2016-05-08 DIAGNOSIS — O99342 Other mental disorders complicating pregnancy, second trimester: Secondary | ICD-10-CM | POA: Insufficient documentation

## 2016-05-08 DIAGNOSIS — F419 Anxiety disorder, unspecified: Secondary | ICD-10-CM | POA: Insufficient documentation

## 2016-05-08 DIAGNOSIS — O99512 Diseases of the respiratory system complicating pregnancy, second trimester: Secondary | ICD-10-CM | POA: Insufficient documentation

## 2016-05-08 DIAGNOSIS — O26852 Spotting complicating pregnancy, second trimester: Secondary | ICD-10-CM | POA: Diagnosis not present

## 2016-05-08 DIAGNOSIS — J45909 Unspecified asthma, uncomplicated: Secondary | ICD-10-CM | POA: Insufficient documentation

## 2016-05-08 DIAGNOSIS — Z3A18 18 weeks gestation of pregnancy: Secondary | ICD-10-CM | POA: Diagnosis not present

## 2016-05-08 DIAGNOSIS — F329 Major depressive disorder, single episode, unspecified: Secondary | ICD-10-CM | POA: Diagnosis not present

## 2016-05-08 DIAGNOSIS — N760 Acute vaginitis: Secondary | ICD-10-CM

## 2016-05-08 DIAGNOSIS — O98812 Other maternal infectious and parasitic diseases complicating pregnancy, second trimester: Secondary | ICD-10-CM | POA: Diagnosis not present

## 2016-05-08 LAB — WET PREP, GENITAL
SPERM: NONE SEEN
TRICH WET PREP: NONE SEEN

## 2016-05-08 MED ORDER — TERCONAZOLE 0.4 % VA CREA: 1.0000 | TOPICAL_CREAM | Freq: Every day | VAGINAL | 0 refills | Status: DC

## 2016-05-08 MED ORDER — METRONIDAZOLE 500 MG PO TABS
500.0000 mg | ORAL_TABLET | Freq: Two times a day (BID) | ORAL | 0 refills | Status: DC
Start: 2016-05-08 — End: 2016-05-25

## 2016-05-08 NOTE — Discharge Instructions (Signed)
Bacterial Vaginosis Bacterial vaginosis is a vaginal infection that occurs when the normal balance of bacteria in the vagina is disrupted. It results from an overgrowth of certain bacteria. This is the most common vaginal infection among women ages 15-44. Because bacterial vaginosis increases your risk for STIs (sexually transmitted infections), getting treated can help reduce your risk for chlamydia, gonorrhea, herpes, and HIV (human immunodeficiency virus). Treatment is also important for preventing complications in pregnant women, because this condition can cause an early (premature) delivery. What are the causes? This condition is caused by an increase in harmful bacteria that are normally present in small amounts in the vagina. However, the reason that the condition develops is not fully understood. What increases the risk? The following factors may make you more likely to develop this condition:  Having a new sexual partner or multiple sexual partners.  Having unprotected sex.  Douching.  Having an intrauterine device (IUD).  Smoking.  Drug and alcohol abuse.  Taking certain antibiotic medicines.  Being pregnant. You cannot get bacterial vaginosis from toilet seats, bedding, swimming pools, or contact with objects around you. What are the signs or symptoms? Symptoms of this condition include:  Grey or white vaginal discharge. The discharge can also be watery or foamy.  A fish-like odor with discharge, especially after sexual intercourse or during menstruation.  Itching in and around the vagina.  Burning or pain with urination. Some women with bacterial vaginosis have no signs or symptoms. How is this diagnosed? This condition is diagnosed based on:  Your medical history.  A physical exam of the vagina.  Testing a sample of vaginal fluid under a microscope to look for a large amount of bad bacteria or abnormal cells. Your health care provider may use a cotton swab or a  small wooden spatula to collect the sample. How is this treated? This condition is treated with antibiotics. These may be given as a pill, a vaginal cream, or a medicine that is put into the vagina (suppository). If the condition comes back after treatment, a second round of antibiotics may be needed. Follow these instructions at home: Medicines   Take over-the-counter and prescription medicines only as told by your health care provider.  Take or use your antibiotic as told by your health care provider. Do not stop taking or using the antibiotic even if you start to feel better. General instructions   If you have a female sexual partner, tell her that you have a vaginal infection. She should see her health care provider and be treated if she has symptoms. If you have a female sexual partner, he does not need treatment.  During treatment:  Avoid sexual activity until you finish treatment.  Do not douche.  Avoid alcohol as directed by your health care provider.  Avoid breastfeeding as directed by your health care provider.  Drink enough water and fluids to keep your urine clear or pale yellow.  Keep the area around your vagina and rectum clean.  Wash the area daily with warm water.  Wipe yourself from front to back after using the toilet.  Keep all follow-up visits as told by your health care provider. This is important. How is this prevented?  Do not douche.  Wash the outside of your vagina with warm water only.  Use protection when having sex. This includes latex condoms and dental dams.  Limit how many sexual partners you have. To help prevent bacterial vaginosis, it is best to have sex with just one   partner (monogamous).  Make sure you and your sexual partner are tested for STIs.  Wear cotton or cotton-lined underwear.  Avoid wearing tight pants and pantyhose, especially during summer.  Limit the amount of alcohol that you drink.  Do not use any products that contain  nicotine or tobacco, such as cigarettes and e-cigarettes. If you need help quitting, ask your health care provider.  Do not use illegal drugs. Where to find more information:  Centers for Disease Control and Prevention: www.cdc.gov/std  American Sexual Health Association (ASHA): www.ashastd.org  [place] Department of Health and Human Services, Office on Women's Health: www.womenshealth.gov/ or https://www.womenshealth.gov/a-z-topics/bacterial-vaginosis Contact a health care provider if:  Your symptoms do not improve, even after treatment.  You have more discharge or pain when urinating.  You have a fever.  You have pain in your abdomen.  You have pain during sex.  You have vaginal bleeding between periods. Summary  Bacterial vaginosis is a vaginal infection that occurs when the normal balance of bacteria in the vagina is disrupted.  Because bacterial vaginosis increases your risk for STIs (sexually transmitted infections), getting treated can help reduce your risk for chlamydia, gonorrhea, herpes, and HIV (human immunodeficiency virus). Treatment is also important for preventing complications in pregnant women, because the condition can cause an early (premature) delivery.  This condition is treated with antibiotic medicines. These may be given as a pill, a vaginal cream, or a medicine that is put into the vagina (suppository). This information is not intended to replace advice given to you by your health care provider. Make sure you discuss any questions you have with your health care provider. Document Released: 01/02/2005 Document Revised: 09/18/2015 Document Reviewed: 09/18/2015 Elsevier Interactive Patient Education  2017 Elsevier Inc. Vaginal Yeast infection, Adult Vaginal yeast infection is a condition that causes soreness, swelling, and redness (inflammation) of the vagina. It also causes vaginal discharge. This is a common condition. Some women get this infection  frequently. What are the causes? This condition is caused by a change in the normal balance of the yeast (candida) and bacteria that live in the vagina. This change causes an overgrowth of yeast, which causes the inflammation. What increases the risk? This condition is more likely to develop in:  Women who take antibiotic medicines.  Women who have diabetes.  Women who take birth control pills.  Women who are pregnant.  Women who douche often.  Women who have a weak defense (immune) system.  Women who have been taking steroid medicines for a long time.  Women who frequently wear tight clothing. What are the signs or symptoms? Symptoms of this condition include:  White, thick vaginal discharge.  Swelling, itching, redness, and irritation of the vagina. The lips of the vagina (vulva) may be affected as well.  Pain or a burning feeling while urinating.  Pain during sex. How is this diagnosed? This condition is diagnosed with a medical history and physical exam. This will include a pelvic exam. Your health care provider will examine a sample of your vaginal discharge under a microscope. Your health care provider may send this sample for testing to confirm the diagnosis. How is this treated? This condition is treated with medicine. Medicines may be over-the-counter or prescription. You may be told to use one or more of the following:  Medicine that is taken orally.  Medicine that is applied as a cream.  Medicine that is inserted directly into the vagina (suppository). Follow these instructions at home:  Take or apply over-the-counter   and prescription medicines only as told by your health care provider.  Do not have sex until your health care provider has approved. Tell your sex partner that you have a yeast infection. That person should go to his or her health care provider if he or she develops symptoms.  Do not wear tight clothes, such as pantyhose or tight pants.  Avoid  using tampons until your health care provider approves.  Eat more yogurt. This may help to keep your yeast infection from returning.  Try taking a sitz bath to help with discomfort. This is a warm water bath that is taken while you are sitting down. The water should only come up to your hips and should cover your buttocks. Do this 3-4 times per day or as told by your health care provider.  Do not douche.  Wear breathable, cotton underwear.  If you have diabetes, keep your blood sugar levels under control. Contact a health care provider if:  You have a fever.  Your symptoms go away and then return.  Your symptoms do not get better with treatment.  Your symptoms get worse.  You have new symptoms.  You develop blisters in or around your vagina.  You have blood coming from your vagina and it is not your menstrual period.  You develop pain in your abdomen. This information is not intended to replace advice given to you by your health care provider. Make sure you discuss any questions you have with your health care provider. Document Released: 10/12/2004 Document Revised: 06/16/2015 Document Reviewed: 07/06/2014 Elsevier Interactive Patient Education  2017 Elsevier Inc.  

## 2016-05-08 NOTE — MAU Provider Note (Signed)
History     CSN: [id number]  Arrival date and time: 05/08/16 4098   First Provider Initiated Contact with Patient 05/08/16 1854      Chief Complaint  Patient presents with  . Vaginal Bleeding  . Abdominal Pain   Monica Munoz is a 26 y.o. G3P2001 at [redacted]w[redacted]d presenting with first episode of vaginal spotting associated with mild intermittent suprapubic cramping. Had pink-brown spotting and brown streak on toilet tissue. Denies abdominal pain now.  Onset 1-2 hr PTA. Denies antecedent intercourse. Denies irritative vaginal discharge. Had + CT with - TOC this pregnancy. Anatomic [place] is scheduled for 05/11/2016 .Nausea/vomiting has resolved.  Prenatal care at [facility] 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   OB History  Gravida Para Term Preterm AB Living  0 0 1  SAB TAB Ectopic Multiple Live Births  0 0 0 0 2    # Outcome Date GA Lbr Len/2nd Weight Sex Delivery Anes PTL Lv  3 Current           2 Term 10/13/14 [redacted]w[redacted]d 10:42 / 00:15 3.714 kg (8 lb 3 oz) F Vag-Spont EPI  DEC  1 Term 05/27/13 [redacted]w[redacted]d 02:30 / 02:19 3.92 kg (8 lb 10.3 oz) M Vag-Spont EPI  LIV    Obstetric Comments  Deceased at 11 months from cyst in throat.       Past Medical History:  Diagnosis Date  . Anxiety   . Asthma   . Depression   . Migraine with aura 04/25/2012    Past Surgical History:  Procedure Laterality Date  . WISDOM TOOTH EXTRACTION      Family History  Problem Relation Age of Onset  . Diabetes Maternal Grandfather   . Hyperlipidemia Maternal Grandfather   . Stroke Maternal Grandfather   . Heart murmur Mother   . Miscarriages / [place] Mother   . Diabetes Mother   . Autism Paternal Uncle   . Down syndrome Paternal Uncle   . Stroke Maternal Grandmother     Social History  Substance Use Topics  . Smoking status: Never Smoker  . Smokeless tobacco: Never Used  . Alcohol use No    Allergies:  Allergies  Allergen Reactions   . Benadryl [Diphenhydramine Hcl] Other (See Comments)    Reaction:  Red Man Syndrome   . Lorazepam Other (See Comments)    Pt states that this med makes her feel "loopy".   . Vancomycin Itching  . Adhesive [Tape] Itching and Dermatitis    Prescriptions Prior to Admission  Medication Sig Dispense Refill Last Dose  . ondansetron (ZOFRAN ODT) 4 MG disintegrating tablet Take 1 tablet (4 mg total) by mouth every 8 (eight) hours as needed for nausea or vomiting. 20 tablet 1   . Prenatal Vit-Fe Fumarate-FA (PRENATAL MULTIVITAMIN) TABS tablet Take 1 tablet by mouth at bedtime.    05/05/2016 at Unknown time  . ranitidine (ZANTAC) 150 MG tablet Take 1 tablet (150 mg total) by mouth 2 (two) times daily. 60 tablet 1     Review of Systems  Constitutional: Negative for fatigue and fever.  Respiratory: Negative for chest tightness.   Gastrointestinal: Negative for abdominal pain, constipation, diarrhea, nausea and vomiting.  Genitourinary: Positive for vaginal discharge. Negative for dysuria, flank pain, frequency, hematuria, pelvic pain and urgency.  Musculoskeletal: Negative for back pain.  Psychiatric/Behavioral: Negative for agitation. The patient is nervous/anxious.    Physical Exam   Blood pressure (!) 106/59, pulse 67, temperature  98.2 F (36.8 C), temperature source Oral, resp. rate 16, weight 63.7 kg (140 lb 8 oz), last menstrual period 12/17/2015, SpO2 100 %, unknown if currently breastfeeding.  Physical Exam  Nursing note and vitals reviewed. Constitutional: She is oriented to person, place, and time. She appears well-developed and well-nourished. No distress.  HENT:  Head: Normocephalic.  Eyes: Pupils are equal, round, and reactive to light.  Neck: Neck supple. No thyromegaly present.  Cardiovascular: Normal rate.   Respiratory: Effort normal.  GI: Soft. There is no tenderness. There is no rebound and no guarding.  DT FHR 150 Fundus at [id number]  Genitourinary: Vaginal discharge  found.  Genitourinary Comments: Spec; NEFG; vagina pionk with small amount clumpy white discharge; cx clean; no blood noted; not friable   Musculoskeletal: Normal range of motion.  Neurological: She is alert and oriented to person, place, and time.  Skin: Skin is warm and dry.  Psychiatric: She has a normal mood and affect. Her behavior is normal.    MAU Course  Procedures Results for orders placed or performed during the hospital encounter of 05/08/16 (from the past 24 hour(s))  Wet prep, genital     Status: Abnormal   Collection Time: 05/08/16  7:10 PM  Result Value Ref Range   Yeast Wet Prep HPF POC PRESENT (A) NONE SEEN   Trich, Wet Prep NONE SEEN NONE SEEN   Clue Cells Wet Prep HPF POC PRESENT (A) NONE SEEN   WBC, Wet Prep HPF POC MANY (A) NONE SEEN   Sperm NONE SEEN    Will discharge home with vaginitis treatments and advise pelvic rest until next PNV. Return here if further bleeding  Assessment and Plan   G3P2001 at [redacted]w[redacted]d Fetal well being by FHR 1. Spotting affecting pregnancy in second trimester   2. BV (bacterial vaginosis)   3. Yeast vaginitis    Allergies as of 05/08/2016      Reactions   Benadryl [diphenhydramine Hcl] Other (See Comments)   Reaction:  Red Man Syndrome    Reglan [metoclopramide] Shortness Of Breath   Chest pain, face numb and red   Lorazepam Other (See Comments)   Pt states that this med makes her feel "loopy".    Vancomycin Itching   Adhesive [tape] Itching, Dermatitis      Medication List    STOP taking these medications   ondansetron 4 MG disintegrating tablet Commonly known as:  ZOFRAN ODT   ranitidine 150 MG tablet Commonly known as:  ZANTAC     TAKE these medications   metroNIDAZOLE 500 MG tablet Commonly known as:  FLAGYL Take 1 tablet (500 mg total) by mouth 2 (two) times daily.   prenatal multivitamin Tabs tablet Take 1 tablet by mouth at bedtime.   terconazole 0.4 % vaginal cream Commonly known as:  TERAZOL 7 Place 1  applicator vaginally at bedtime.      Follow-up Information    [facility] Follow up.   Specialty:  Obstetrics and Gynecology Why:  Keep your scheduled prenatal appointment Contact information: [address] [place] [address] [phone]        Danae Orleans, CNM 05/08/2016 8:12 PM

## 2016-05-08 NOTE — MAU Note (Signed)
Noted pink blood every time she wiped after using the restroom.  No hx of bleeding during preg.  Back and stomach are crampy, but that is off and on. Denies pain with urination

## 2016-05-09 LAB — GC/CHLAMYDIA PROBE AMP (~~LOC~~) NOT AT ARMC
CHLAMYDIA, DNA PROBE: NEGATIVE
NEISSERIA GONORRHEA: NEGATIVE

## 2016-05-11 ENCOUNTER — Ambulatory Visit (HOSPITAL_COMMUNITY)
Admission: RE | Admit: 2016-05-11 | Discharge: 2016-05-11 | Disposition: A | Discharge status: Home / Self Care | Payer: Medicaid Other | Source: Ambulatory Visit | Attending: Advanced Practice Midwife | Admitting: Advanced Practice Midwife

## 2016-05-11 ENCOUNTER — Ambulatory Visit (INDEPENDENT_AMBULATORY_CARE_PROVIDER_SITE_OTHER): Payer: Medicaid Other | Admitting: Advanced Practice Midwife

## 2016-05-11 ENCOUNTER — Encounter (HOSPITAL_COMMUNITY): Payer: Self-pay

## 2016-05-11 VITALS — BP 111/70 | HR 67 | Wt 137.6 lb

## 2016-05-11 DIAGNOSIS — Z3482 Encounter for supervision of other normal pregnancy, second trimester: Secondary | ICD-10-CM

## 2016-05-11 DIAGNOSIS — Z3A19 19 weeks gestation of pregnancy: Secondary | ICD-10-CM | POA: Diagnosis not present

## 2016-05-11 DIAGNOSIS — Z363 Encounter for antenatal screening for malformations: Secondary | ICD-10-CM

## 2016-05-11 DIAGNOSIS — Z3689 Encounter for other specified antenatal screening: Secondary | ICD-10-CM | POA: Insufficient documentation

## 2016-05-11 DIAGNOSIS — Z3480 Encounter for supervision of other normal pregnancy, unspecified trimester: Secondary | ICD-10-CM

## 2016-05-11 DIAGNOSIS — Z3A18 18 weeks gestation of pregnancy: Secondary | ICD-10-CM

## 2016-05-11 DIAGNOSIS — O99342 Other mental disorders complicating pregnancy, second trimester: Secondary | ICD-10-CM | POA: Diagnosis not present

## 2016-05-11 DIAGNOSIS — O99512 Diseases of the respiratory system complicating pregnancy, second trimester: Secondary | ICD-10-CM | POA: Insufficient documentation

## 2016-05-11 DIAGNOSIS — Z87898 Personal history of other specified conditions: Secondary | ICD-10-CM

## 2016-05-11 DIAGNOSIS — J45909 Unspecified asthma, uncomplicated: Secondary | ICD-10-CM | POA: Diagnosis not present

## 2016-05-11 DIAGNOSIS — F1991 Other psychoactive substance use, unspecified, in remission: Secondary | ICD-10-CM

## 2016-05-11 DIAGNOSIS — O219 Vomiting of pregnancy, unspecified: Secondary | ICD-10-CM

## 2016-05-11 MED ORDER — ONDANSETRON 4 MG PO TBDP: 4.0000 mg | ORAL_TABLET | Freq: Three times a day (TID) | ORAL | 3 refills | Status: DC | PRN

## 2016-05-11 NOTE — Addendum Note (Signed)
Addended by: Cheree Ditto, Cadarius Nevares A on: 05/11/2016 01:22 PM   Modules accepted: Orders

## 2016-05-11 NOTE — Patient Instructions (Signed)
Second Trimester of Pregnancy The second trimester is from week 14 through week 27 (months 4 through 6). The second trimester is often a time when you feel your best. Your body has adjusted to being pregnant, and you begin to feel better physically. Usually, morning sickness has lessened or quit completely, you may have more energy, and you may have an increase in appetite. The second trimester is also a time when the fetus is growing rapidly. At the end of the sixth month, the fetus is about 9 inches long and weighs about 1 pounds. You will likely begin to feel the baby move (quickening) between 16 and 20 weeks of pregnancy. Body changes during your second trimester Your body continues to go through many changes during your second trimester. The changes vary from woman to woman.  Your weight will continue to increase. You will notice your lower abdomen bulging out.  You may begin to get stretch marks on your hips, abdomen, and breasts.  You may develop headaches that can be relieved by medicines. The medicines should be approved by your health care provider.  You may urinate more often because the fetus is pressing on your bladder.  You may develop or continue to have heartburn as a result of your pregnancy.  You may develop constipation because certain hormones are causing the muscles that push waste through your intestines to slow down.  You may develop hemorrhoids or swollen, bulging veins (varicose veins).  You may have back pain. This is caused by:  Weight gain.  Pregnancy hormones that are relaxing the joints in your pelvis.  A shift in weight and the muscles that support your balance.  Your breasts will continue to grow and they will continue to become tender.  Your gums may bleed and may be sensitive to brushing and flossing.  Dark spots or blotches (chloasma, mask of pregnancy) may develop on your face. This will likely fade after the baby is born.  A dark line from your  belly button to the pubic area (linea nigra) may appear. This will likely fade after the baby is born.  You may have changes in your hair. These can include thickening of your hair, rapid growth, and changes in texture. Some women also have hair loss during or after pregnancy, or hair that feels dry or thin. Your hair will most likely return to normal after your baby is born. What to expect at prenatal visits During a routine prenatal visit:  You will be weighed to make sure you and the fetus are growing normally.  Your blood pressure will be taken.  Your abdomen will be measured to track your baby's growth.  The fetal heartbeat will be listened to.  Any test results from the previous visit will be discussed. Your health care provider may ask you:  How you are feeling.  If you are feeling the baby move.  If you have had any abnormal symptoms, such as leaking fluid, bleeding, severe headaches, or abdominal cramping.  If you are using any tobacco products, including cigarettes, chewing tobacco, and electronic cigarettes.  If you have any questions. Other tests that may be performed during your second trimester include:  Blood tests that check for:  Low iron levels (anemia).  High blood sugar that affects pregnant women (gestational diabetes) between 24 and 28 weeks.  Rh antibodies. This is to check for a protein on red blood cells (Rh factor).  Urine tests to check for infections, diabetes, or protein in the   urine.  An ultrasound to confirm the proper growth and development of the baby.  An amniocentesis to check for possible genetic problems.  Fetal screens for spina bifida and Down syndrome.  HIV (human immunodeficiency virus) testing. Routine prenatal testing includes screening for HIV, unless you choose not to have this test. Follow these instructions at home: Medicines   Follow your health care provider's instructions regarding medicine use. Specific medicines may  be either safe or unsafe to take during pregnancy.  Take a prenatal vitamin that contains at least 600 micrograms (mcg) of folic acid.  If you develop constipation, try taking a stool softener if your health care provider approves. Eating and drinking   Eat a balanced diet that includes fresh fruits and vegetables, whole grains, good sources of protein such as meat, eggs, or tofu, and low-fat dairy. Your health care provider will help you determine the amount of weight gain that is right for you.  Avoid raw meat and uncooked cheese. These carry germs that can cause birth defects in the baby.  If you have low calcium intake from food, talk to your health care provider about whether you should take a daily calcium supplement.  Limit foods that are high in fat and processed sugars, such as fried and sweet foods.  To prevent constipation:  Drink enough fluid to keep your urine clear or pale yellow.  Eat foods that are high in fiber, such as fresh fruits and vegetables, whole grains, and beans. Activity   Exercise only as directed by your health care provider. Most women can continue their usual exercise routine during pregnancy. Try to exercise for 30 minutes at least 5 days a week. Stop exercising if you experience uterine contractions.  Avoid heavy lifting, wear low heel shoes, and practice good posture.  A sexual relationship may be continued unless your health care provider directs you otherwise. Relieving pain and discomfort   Wear a good support bra to prevent discomfort from breast tenderness.  Take warm sitz baths to soothe any pain or discomfort caused by hemorrhoids. Use hemorrhoid cream if your health care provider approves.  Rest with your legs elevated if you have leg cramps or low back pain.  If you develop varicose veins, wear support hose. Elevate your feet for 15 minutes, 3-4 times a day. Limit salt in your diet. Prenatal Care   Write down your questions. Take them  to your prenatal visits.  Keep all your prenatal visits as told by your health care provider. This is important. Safety   Wear your seat belt at all times when driving.  Make a list of emergency phone numbers, including numbers for family, friends, the hospital, and police and fire departments. General instructions   Ask your health care provider for a referral to a local prenatal education class. Begin classes no later than the beginning of month 6 of your pregnancy.  Ask for help if you have counseling or nutritional needs during pregnancy. Your health care provider can offer advice or refer you to specialists for help with various needs.  Do not use hot tubs, steam rooms, or saunas.  Do not douche or use tampons or scented sanitary pads.  Do not cross your legs for long periods of time.  Avoid cat litter boxes and soil used by cats. These carry germs that can cause birth defects in the baby and possibly loss of the fetus by miscarriage or stillbirth.  Avoid all smoking, herbs, alcohol, and unprescribed drugs. Chemicals in   these products can affect the formation and growth of the baby.  Do not use any products that contain nicotine or tobacco, such as cigarettes and e-cigarettes. If you need help quitting, ask your health care provider.  Visit your dentist if you have not gone yet during your pregnancy. Use a soft toothbrush to brush your teeth and be gentle when you floss. Contact a health care provider if:  You have dizziness.  You have mild pelvic cramps, pelvic pressure, or nagging pain in the abdominal area.  You have persistent nausea, vomiting, or diarrhea.  You have a bad smelling vaginal discharge.  You have pain when you urinate. Get help right away if:  You have a fever.  You are leaking fluid from your vagina.  You have spotting or bleeding from your vagina.  You have severe abdominal cramping or pain.  You have rapid weight gain or weight loss.  You  have shortness of breath with chest pain.  You notice sudden or extreme swelling of your face, hands, ankles, feet, or legs.  You have not felt your baby move in over an hour.  You have severe headaches that do not go away when you take medicine.  You have vision changes. Summary  The second trimester is from week 14 through week 27 (months 4 through 6). It is also a time when the fetus is growing rapidly.  Your body goes through many changes during pregnancy. The changes vary from woman to woman.  Avoid all smoking, herbs, alcohol, and unprescribed drugs. These chemicals affect the formation and growth your baby.  Do not use any tobacco products, such as cigarettes, chewing tobacco, and e-cigarettes. If you need help quitting, ask your health care provider.  Contact your health care provider if you have any questions. Keep all prenatal visits as told by your health care provider. This is important. This information is not intended to replace advice given to you by your health care provider. Make sure you discuss any questions you have with your health care provider. Document Released: 12/27/2000 Document Revised: 06/10/2015 Document Reviewed: 03/05/2012 Elsevier Interactive Patient Education  2017 [employer].   Breastfeeding Challenges and Solutions Even though breastfeeding is natural, it can be challenging, especially in the first few weeks after childbirth. It is normal for problems to arise when starting to breastfeed your new baby, even if you have breastfed before. This document provides some solutions to the most common breastfeeding challenges. Challenges and solutions Challenge-Cracked or Sore Nipples  Cracked or sore nipples are commonly experienced by breastfeeding mothers. Cracked or sore nipples often are caused by inadequate latching (when your baby's mouth attaches to your breast to breastfeed). Soreness can also happen if your baby is not positioned properly at your  breast. Although nipple cracking and soreness are common during the first week after birth, nipple pain is never normal. If you experience nipple cracking or soreness that lasts longer than 1 week or nipple pain, call your health care provider or lactation consultant. Solution  Ensure proper latching and positioning of your baby by following the steps below:  Find a comfortable place to sit or lie down, with your neck and back well supported.  Place a pillow or rolled up blanket under your baby to bring him or her to the level of your breast (if you are seated).  Make sure that your baby's abdomen is facing your abdomen.  Gently massage your breast. With your fingertips, massage from your chest wall toward  your nipple in a circular motion. This encourages milk flow. You may need to continue this action during the feeding if your milk flows slowly.  Support your breast with 4 fingers underneath and your thumb above your nipple. Make sure your fingers are well away from your nipple and your baby's mouth.  Stroke your baby's lips gently with your finger or nipple.  When your baby's mouth is open wide enough, quickly bring your baby to your breast, placing your entire nipple and as much of the colored area around your nipple (areola) as possible into your baby's mouth.  More areola should be visible above your baby's upper lip than below the lower lip.  Your baby's tongue should be between his or her lower gum and your breast.  Ensure that your baby's mouth is correctly positioned around your nipple (latched). Your baby's lips should create a seal on your breast and be turned out (everted).  It is common for your baby to suck for about 2-3 minutes in order to start the flow of breast milk. Signs that your baby has successfully latched on to your nipple include:  Quietly tugging or quietly sucking without causing you pain.  Swallowing heard between every 3-4 sucks.  Muscle movement above  and in front of his or her ears with sucking. Signs that your baby has not successfully latched on to nipple include:  Sucking sounds or smacking sounds from your baby while nursing.  Nipple pain. Ensure that your breasts stay moisturized and healthy by:  Avoiding the use of soap on your nipples.  Wearing a supportive bra. Avoid wearing underwire-style bras or tight bras.  Air drying your nipples for 3-4 minutes after each feeding.  Using only cotton bra pads to absorb breast milk leakage. Leaking of breast milk between feedings is normal. Be sure to change the pads if they become soaked with milk.  Using lanolin on your nipples after nursing. Lanolin helps to maintain your skin's normal moisture barrier. If you use pure lanolin you do not need to wash it off before feeding your baby again. Pure lanolin is not toxic to your baby. You may also hand express a few drops of breast milk and gently massage that milk into your nipples, allowing it to air dry. Challenge-Breast Engorgement  Breast engorgement is the overfilling of your breasts with breast milk. In the first few weeks after giving birth, you may experience breast engorgement. Breast engorgement can make your breasts throb and feel hard, tightly stretched, warm, and tender. Engorgement peaks about the fifth day after you give birth. Having breast engorgement does not mean you have to stop breastfeeding your baby. Solution  Breastfeed when you feel the need to reduce the fullness of your breasts or when your baby shows signs of hunger. This is called "breastfeeding on demand."  Newborns (babies younger than 4 weeks) often breastfeed every 1-3 hours during the day. You may need to awaken your baby to feed if he or she is asleep at a feeding time.  Do not allow your baby to sleep longer than 5 hours during the night without a feeding.  Pump or hand express breast milk before breastfeeding to soften your breast, areola, and  nipple.  Apply warm, moist heat (in the shower or with warm water-soaked hand towels) just before feeding or pumping, or massage your breast before or during breastfeeding. This increases circulation and helps your milk to flow.  Completely empty your breasts when breastfeeding or pumping. Afterward,  wear a snug bra (nursing or regular) or tank top for 1-2 days to signal your body to slightly decrease milk production. Only wear snug bras or tank tops to treat engorgement. Tight bras typically should be avoided by breastfeeding mothers. Once engorgement is relieved, return to wearing regular, loose-fitting clothes.  Apply ice packs to your breasts to lessen the pain from engorgement and relieve swelling, unless the ice is uncomfortable for you.  Do not delay feedings. Try to relax when it is time to feed your baby. This helps to trigger your "let-down reflex," which releases milk from your breast.  Ensure your baby is latched on to your breast and positioned properly while breastfeeding.  Allow your baby to remain at your breast as long as he or she is latched on well and actively sucking. Your baby will let you know when he or she is done breastfeeding by pulling away from your breast or falling asleep.  Avoid introducing bottles or pacifiers to your baby in the early weeks of breastfeeding. Wait to introduce these things until after resolving any breastfeeding challenges.  Try to pump your milk on the same schedule as when your baby would breastfeed if you are returning to work or away from home for an extended period.  Drink plenty of fluids to avoid dehydration, which can eventually put you at greater risk of breast engorgement. If you follow these suggestions, your engorgement should improve in 24-48 hours. If you are still experiencing difficulty, call your lactation consultant or health care provider. Challenge-Plugged Milk Ducts  Plugged milk ducts occur when the duct does not drain milk  effectively and becomes swollen. Wearing a tight-fitting nursing bra or having difficulty with latching may cause plugged milk ducts. Not drinking enough water (8-10 c [1.9-2.4 L] per day) can contribute to plugged milk ducts. Once a duct has become plugged, hard lumps, soreness, and redness may develop in your breast. Solution  Do not delay feedings. Feed your baby frequently and try to empty your breasts of milk at each feeding. Try breastfeeding from the affected side first so there is a better chance that the milk will drain completely from that breast. Apply warm, moist towels to your breasts for 5-10 minutes before feeding. Alternatively, a hot shower right before breastfeeding can provide the moist heat that can encourage milk flow. Gentle massage of the sore area before and during a feeding may also help. Avoid wearing tight clothing or bras that put pressure on your breasts. Wear bras that offer good support to your breasts, but avoid underwire bras. If you have a plugged milk duct and develop a fever, you need to see your health care provider. Challenge-Mastitis  Mastitis is inflammation of your breast. It usually is caused by a bacterial infection and can cause flu-like symptoms. You may develop redness in your breast and a fever. Often when mastitis occurs, your breast becomes firm, warm, and very painful. The most common causes of mastitis are poor latching, ineffective sucking from your baby, consistent pressure on your breast (possibly from wearing a tight-fitting bra or shirt that restricts the milk flow), unusual stress or fatigue, or missed feedings. Solution  You will be given antibiotic medicine to treat the infection. It is still important to breastfeed frequently to empty your breasts. Continuing to breastfeed while you recover from mastitis will not harm your baby. Make sure your baby is positioned properly during every feeding. Apply moist heat to your breasts for a few minutes before  feeding to help the milk flow and to help your breasts empty more easily. Challenge-Thrush  Ginette Pitman is a yeast infection that can form on your nipples, in your breast, or in your baby's mouth. It causes itching, soreness, burning or stabbing pain, and sometimes a rash. Solution  You will be given a medicated ointment for your nipples, and your baby will be given a liquid medicine for his or her mouth. It is important that you and your baby are treated at the same time because thrush can be passed between you and your baby. Change disposable nursing pads often. Any bras, towels, or clothing that come in contact with infected areas of your body or your baby's body need to be washed in very hot water every day. Wash your hands and your baby's hands often. All pacifiers, bottle nipples, or toys your baby puts in his or her mouth should be boiled once a day for 20 minutes. After 1 week of treatment, discard pacifiers and bottle nipples and buy new ones. All breast pump parts that touch the milk need to be boiled for 20 minutes every day. Challenge-Low Milk Supply  You may not be producing enough milk if your baby is not gaining the proper amount of weight. Breast milk production is based on a supply-and-demand system. Your milk supply depends on how frequently and effectively your baby empties your breast. Solution  The more you breastfeed and pump, the more breast milk you will produce. It is important that your baby empties at least one of your breasts at each feeding. If this is not happening, then use a breast pump or hand express any milk that remains. This will help to drain as much milk as possible at each feeding. It will also signal your body to produce more milk. If your baby is not emptying your breasts, it may be due to latching, sucking, or positioning problems. If low milk supply continues after addressing these issues, contact your health care provider or a lactation specialist as soon as  possible. Challenge-Inverted or Flat Nipples  Some women have nipples that turn inward instead of protruding outward. Other women have nipples that are flat. Inverted or flat nipples can sometimes make it more difficult for your baby to latch onto your breast. Solution  You may be given a small device that pulls out inverted nipples. This device should be applied right before your baby is brought to your breast. You can also try using a breast pump for a short time before placing the baby at your breast. The pump can pull your nipple outwards to help your infant latch more easily. The baby's sucking motion will help the inverted nipple protrude as well. If you have flat nipples, encourage your baby to latch onto your breast and feed frequently in the early days after birth. This will give your baby practice latching on correctly while your breast is still soft. When your milk supply increases, between the second and fifth day after birth and your breasts become full, your baby will have an easier time latching. Contact a lactation consultant if you still have concerns. She or he can teach you additional techniques to address breastfeeding problems related to nipple shape and position. Where to find more information: [employer]: www.llli.org This information is not intended to replace advice given to you by your health care provider. Make sure you discuss any questions you have with your health care provider. Document Released: 06/26/2005 Document Revised: 06/16/2015 Document  Reviewed: 06/28/2012 Elsevier Interactive Patient Education  2017 [employer].

## 2016-05-11 NOTE — Progress Notes (Signed)
   PRENATAL VISIT NOTE  Subjective:  Monica Munoz is a 26 y.o. G3P2001 at [redacted]w[redacted]d being seen today for ongoing prenatal care.  She is currently monitored for the following issues for this low-risk pregnancy and has Anxiety state, unspecified; Asthma, mild intermittent; Depression; Headache, migraine; Pelvic inflammatory disease; Fitz-Hugh-Curtis syndrome; Tachycardia; Pelvic pain in female; Supervision of normal pregnancy, antepartum; and Death of infant on her problem list.  Patient reports nausea and vomiting controlled by Zofran, but was told to stop Zofran due to pregnancy .  Contractions: Not present. Vag. Bleeding: None.  Movement: Present. Denies leaking of fluid.   The following portions of the patient's history were reviewed and updated as appropriate: allergies, current medications, past family history, past medical history, past social history, past surgical history and problem list. Problem list updated.  Objective:   Vitals:   05/11/16 0908  BP: 111/70  Pulse: 67  Weight: 137 lb 9.6 oz (62.4 kg)    Fetal Status: Fetal Heart Rate (bpm): 147 Fundal Height: 19 cm Movement: Present     General:  Alert, oriented and cooperative. Patient is in no acute distress.  Skin: Skin is warm and dry. No rash noted.   Cardiovascular: Normal heart rate noted  Respiratory: Normal respiratory effort, no problems with respiration noted  Abdomen: Soft, gravid, appropriate for gestational age. Pain/Pressure: Present     Pelvic:  Cervical exam deferred        Extremities: Normal range of motion.  Edema: None  Mental Status: Normal mood and affect. Normal behavior. Normal judgment and thought content.   Assessment and Plan:  Pregnancy: G3P2001 at [redacted]w[redacted]d  1. Supervision of other normal pregnancy, antepartum  - AFP, Serum, Open Spina Bifida  2. Nausea and vomiting of pregnancy, antepartum  - ondansetron (ZOFRAN-ODT) 4 MG disintegrating tablet; Take 1 tablet (4 mg total) by mouth every 8 (eight)  hours as needed for nausea or vomiting.  Dispense: 20 tablet; Refill: 3  3. History of drug use  - POCT Urine Drug Screen--written consent obtained  Preterm labor symptoms and general obstetric precautions including but not limited to vaginal bleeding, contractions, leaking of fluid and fetal movement were reviewed in detail with the patient. Please refer to After Visit Summary for other counseling recommendations.  Return in about 4 weeks (around 06/08/2016) for ROB.   Dorathy Kinsman, CNM

## 2016-05-12 ENCOUNTER — Encounter: Payer: Self-pay | Admitting: Advanced Practice Midwife

## 2016-05-12 DIAGNOSIS — O4442 Low lying placenta NOS or without hemorrhage, second trimester: Secondary | ICD-10-CM | POA: Insufficient documentation

## 2016-05-14 ENCOUNTER — Inpatient Hospital Stay (HOSPITAL_COMMUNITY)
Admission: AD | Admit: 2016-05-14 | Discharge: 2016-05-14 | Disposition: A | Discharge status: Home / Self Care | Payer: Medicaid Other | Source: Ambulatory Visit | Attending: Obstetrics & Gynecology | Admitting: Obstetrics & Gynecology

## 2016-05-14 ENCOUNTER — Encounter (HOSPITAL_COMMUNITY): Payer: Self-pay

## 2016-05-14 DIAGNOSIS — O26852 Spotting complicating pregnancy, second trimester: Secondary | ICD-10-CM | POA: Insufficient documentation

## 2016-05-14 DIAGNOSIS — Z3A19 19 weeks gestation of pregnancy: Secondary | ICD-10-CM | POA: Insufficient documentation

## 2016-05-14 DIAGNOSIS — O4442 Low lying placenta NOS or without hemorrhage, second trimester: Secondary | ICD-10-CM

## 2016-05-14 DIAGNOSIS — Z881 Allergy status to other antibiotic agents status: Secondary | ICD-10-CM | POA: Diagnosis not present

## 2016-05-14 LAB — URINALYSIS, ROUTINE W REFLEX MICROSCOPIC
Bilirubin Urine: NEGATIVE
GLUCOSE, UA: NEGATIVE mg/dL
HGB URINE DIPSTICK: NEGATIVE
Ketones, ur: NEGATIVE mg/dL
Nitrite: NEGATIVE
PH: 7 (ref 5.0–8.0)
PROTEIN: NEGATIVE mg/dL
SPECIFIC GRAVITY, URINE: 1.004 — AB (ref 1.005–1.030)

## 2016-05-14 LAB — WET PREP, GENITAL
SPERM: NONE SEEN
Trich, Wet Prep: NONE SEEN

## 2016-05-14 NOTE — MAU Provider Note (Signed)
Chief Complaint: Vaginal Bleeding  First Provider Initiated Contact with Patient 05/14/16 2005      SUBJECTIVE HPI: Monica Munoz is a 26 y.o. G3P2001 at [redacted]w[redacted]d who presents to Maternity Admissions reporting scant spotting. Had had more pelvic w/ this pregnancy than previous pregnancies. Doesn't thinks she having contractions.  Had anatomy US 05/11/16 showing low-lying placenta, CL 3.7 cm.   Associated signs and symptoms: Neg for contractions, LOF, urinary complaints, GI complaints.  Past Medical History:  Diagnosis Date  . Anxiety   . Asthma   . Depression   . Migraine with aura 04/25/2012   OB History  Gravida Para Term Preterm AB Living  0 0 1  SAB TAB Ectopic Multiple Live Births  0 0 0 0 2    # Outcome Date GA Lbr Len/2nd Weight Sex Delivery Anes PTL Lv  3 Current           2 Term 10/13/14 [redacted]w[redacted]d 10:42 / 00:15 8 lb 3 oz (3.714 kg) F Vag-Spont EPI  DEC  1 Term 05/27/13 [redacted]w[redacted]d 02:30 / 02:19 8 lb 10.3 oz (3.92 kg) M Vag-Spont EPI  LIV    Obstetric Comments  Deceased at 11 months from cyst in throat.    Past Surgical History:  Procedure Laterality Date  . WISDOM TOOTH EXTRACTION     Social History   Social History  . Marital status: Single    Spouse name: N/A  . Number of children: N/A  . Years of education: N/A   Occupational History  . Not on file.   Social History Main Topics  . Smoking status: Never Smoker  . Smokeless tobacco: Never Used  . Alcohol use No  . Drug use: No  . Sexual activity: Yes    Birth control/ protection: None   Other Topics Concern  . Not on file   Social History Narrative  . No narrative on file   Family History  Problem Relation Age of Onset  . Diabetes Maternal Grandfather   . Hyperlipidemia Maternal Grandfather   . Stroke Maternal Grandfather   . Heart murmur Mother   . Miscarriages / [place] Mother   . Diabetes Mother   . Autism Paternal Uncle   . Down syndrome Paternal Uncle   . Stroke Maternal Grandmother     No current facility-administered medications on file prior to encounter.    Current Outpatient Prescriptions on File Prior to Encounter  Medication Sig Dispense Refill  . metroNIDAZOLE (FLAGYL) 500 MG tablet Take 1 tablet (500 mg total) by mouth 2 (two) times daily. (Patient not taking: Reported on 05/11/2016) 14 tablet 0  . Prenatal Vit-Fe Fumarate-FA (PRENATAL MULTIVITAMIN) TABS tablet Take 1 tablet by mouth at bedtime.     Marland Kitchen terconazole (TERAZOL 7) 0.4 % vaginal cream Place 1 applicator vaginally at bedtime. (Patient not taking: Reported on 05/11/2016) 45 g 0   Allergies  Allergen Reactions  . Benadryl [Diphenhydramine Hcl] Other (See Comments)    Reaction:  Red Man Syndrome   . Reglan [Metoclopramide] Shortness Of Breath    Chest pain, face numb and red  . Lorazepam Other (See Comments)    Pt states that this med makes her feel "loopy".   . Vancomycin Itching  . Adhesive [Tape] Itching and Dermatitis    I have reviewed patient's Past Medical Hx, Surgical Hx, Family Hx, Social Hx, medications and allergies.   Review of Systems  Constitutional: Negative for chills and fever.  Gastrointestinal: Positive for constipation. Negative  for abdominal pain, blood in stool, diarrhea, nausea and vomiting.  Genitourinary: Positive for pelvic pain (ongoing, no increase) and vaginal bleeding. Negative for dysuria, hematuria, vaginal discharge and vaginal pain.  Musculoskeletal: Negative for back pain.    OBJECTIVE Patient Vitals for the past 24 hrs:  BP Temp Pulse Resp SpO2 Height Weight  05/14/16 1956 125/63 - 76 16 - - -  05/14/16 1819 111/69 98.3 F (36.8 C) 94 16 100 %  (1.651 m) 136 lb (61.7 kg)   Constitutional: Well-developed, well-nourished female in no acute distress.  Cardiovascular: normal rate Respiratory: normal rate and effort.  GI: Abd soft, non-tender, gravid appropriate for gestational age. MS: Extremities nontender, no edema, normal ROM Neurologic: Alert and  oriented x 4.  GU:  SPECULUM EXAM: NEFG, small-mod amount of curdlike discharge, no blood noted, cervix clean, visually long and closed  BIMANUAL: Deferred due to LLP.   FHR 144 by doppler.   LAB RESULTS Results for orders placed or performed during the hospital encounter of 05/14/16 (from the past 24 hour(s))  Urinalysis, Routine w reflex microscopic     Status: Abnormal   Collection Time: 05/14/16  6:18 PM  Result Value Ref Range   Color, Urine YELLOW YELLOW   APPearance CLEAR CLEAR   Specific Gravity, Urine 1.004 (L) 1.005 - 1.030   pH 7.0 5.0 - 8.0   Glucose, UA NEGATIVE NEGATIVE mg/dL   Hgb urine dipstick NEGATIVE NEGATIVE   Bilirubin Urine NEGATIVE NEGATIVE   Ketones, ur NEGATIVE NEGATIVE mg/dL   Protein, ur NEGATIVE NEGATIVE mg/dL   Nitrite NEGATIVE NEGATIVE   Leukocytes, UA MODERATE (A) NEGATIVE   RBC / HPF 0-5 0 - 5 RBC/hpf   WBC, UA 6-30 0 - 5 WBC/hpf   Bacteria, UA RARE (A) NONE SEEN   Squamous Epithelial / LPF 6-30 (A) NONE SEEN    IMAGING NA  MAU COURSE Orders Placed This Encounter  Procedures  . Wet prep, genital  . Urinalysis, Routine w reflex microscopic    MDM - Spotting w/ LLP. No blood seen on exam. Cervix long and closed. No evidence of cervical dilation or active bleeding from LLP.  - VVC, but pt asymptomatic. Will defer Dx.   ASSESSMENT 1. Low-lying placenta in second trimester   2. Spotting affecting pregnancy in second trimester     PLAN Discharge home in stable condition. Bleeding precautions Pelvic rest until resolution of LLP verified. Repeat US at 30-32 weeks. Follow-up Information    [facility] AD[facility] Follow up.   Why:  in emergencies Contact information: [address] [id number] mc [place] [address] [phone]         Allergies as of 05/14/2016      Reactions   Benadryl [diphenhydramine Hcl] Other (See Comments)   Reaction:  Red Man Syndrome    Reglan  [metoclopramide] Shortness Of Breath   Chest pain, face numb and red   Lorazepam Other (See Comments)   Pt states that this med makes her feel "loopy".    Vancomycin Itching   Adhesive [tape] Itching, Dermatitis      Medication List    STOP taking these medications   terconazole 0.4 % vaginal cream Commonly known as:  TERAZOL 7     TAKE these medications   metroNIDAZOLE 500 MG tablet Commonly known as:  FLAGYL Take 1 tablet (500 mg total) by mouth 2 (two) times daily.   ondansetron 4 MG disintegrating tablet Commonly known as:  ZOFRAN ODT Take 1 tablet (4 mg total) by mouth every 8 (eight) hours as needed for nausea or vomiting.   prenatal multivitamin Tabs tablet Take 1 tablet by mouth at bedtime.        [place], [place] 05/14/2016  8:05 PM

## 2016-05-14 NOTE — Discharge Instructions (Signed)
Placenta Previa Placenta previa is a condition in which the placenta implants in the lower part of the uterus in pregnant women. The placenta either partially or completely covers the opening to the cervix. This is a problem because the baby must pass through the cervix during delivery. There are three types of placenta previa:  Marginal placenta previa/Low lying placenta. The placenta reaches within an inch (2.5 cm) of the cervical opening but does not cover it.  Partial placenta previa. The placenta covers part of the cervical opening.  Complete placenta previa. The placenta covers the entire cervical opening. If the previa is marginal or partial and it is diagnosed in the first half of pregnancy, the placenta may move into a normal position as the pregnancy progresses and may no longer cover the cervix. It is important to keep all prenatal visits with your health care provider so you can be more closely monitored. What are the causes? The cause of this condition is not known. What increases the risk? This condition is more likely to develop in women who:  Are carrying more than one baby (multiples).  Have an abnormally shaped uterus.  Have scars on the lining of the uterus.  Have had surgeries involving the uterus, such as a cesarean delivery.  Have delivered a baby before.  Have a history of placenta previa.  Have smoked or used cocaine during pregnancy.  Are age 36 or older during pregnancy. What are the signs or symptoms? The main symptom of this condition is sudden, painless vaginal bleeding during the second half of pregnancy. The amount of bleeding can be very light at first, and it usually stops on its own. Heavier bleeding episodes may also happen. Some women with placenta previa may have no bleeding at all. How is this diagnosed?  This condition is diagnosed:  From an ultrasound. This test uses sound waves to find where the placenta is located before you have any  bleeding episodes.  During a checkup after vaginal bleeding is noticed.  If you are diagnosed with a partial or complete previa, digital exams with fingers will generally be avoided. Your health care provider will still perform a speculum exam.  If you did not have an ultrasound during your pregnancy, placenta previa may not be diagnosed until bleeding occurs during labor. How is this treated? Treatment for this condition may include:  Decreased activity.  Bed rest at home or in the hospital.  Pelvic rest. Nothing is placed inside the vagina during pelvic rest. This means not having sex and not using tampons or douches.  A blood transfusion to replace blood that you have lost (maternal blood loss).  A cesarean delivery. This may be performed if:  The bleeding is heavy and cannot be controlled.  The placenta completely covers the cervix.  Medicines to stop premature labor or to help the baby's lungs to mature. This treatment may be used if you need delivery before your pregnancy is full-term. Your treatment will be decided based on:  How much you are bleeding, or whether the bleeding has stopped.  How far along you are in your pregnancy.  The condition of your baby.  The type of placenta previa that you have. Follow these instructions at home:  Get plenty of rest and lessen activity as told by your health care provider.  Stay on bed rest for as long as told by your health care provider.  Do not have sex, use tampons, use a douche, or place anything  inside of your vagina if your health care provider recommended pelvic rest.  Take over-the-counter and prescription medicines as told by your health care provider.  Keep all follow-up visits as told by your health care provider. This is important. Get help right away if:  You have vaginal bleeding, even if in small amounts and even if you have no pain.  You have cramping or regular contractions.  You have pain in your  abdomen or your lower back.  You have a feeling of increased pressure in your pelvis.  You have increased watery or bloody mucus from the vagina. This information is not intended to replace advice given to you by your health care provider. Make sure you discuss any questions you have with your health care provider. Document Released: 01/02/2005 Document Revised: 09/22/2015 Document Reviewed: 07/17/2015 Elsevier Interactive Patient Education  2017 Elsevier Inc.   Pelvic Rest Pelvic rest may be recommended if:  Your placenta is partially or completely covering the opening of your cervix (placenta previa).  There is bleeding between the wall of the uterus and the amniotic sac in the first trimester of pregnancy (subchorionic hemorrhage).  You went into labor too early (preterm labor). Based on your overall health and the health of your baby, your health care provider will decide if pelvic rest is right for you. How do I rest my pelvis? For as long as told by your health care provider:  Do not have sex, sexual stimulation, or an orgasm.  Do not use tampons. Do not douche. Do not put anything in your vagina.  Do not lift anything that is heavier than 10 lb (4.5 kg).  Avoid activities that take a lot of effort (are strenuous).  Avoid any activity in which your pelvic muscles could become strained. When should I seek medical care? Seek medical care if you have:  Cramping pain in your lower abdomen.  Vaginal discharge.  A low, dull backache.  Regular contractions.  Uterine tightening. When should I seek immediate medical care? Seek immediate medical care if:  You have vaginal bleeding and you are pregnant. This information is not intended to replace advice given to you by your health care provider. Make sure you discuss any questions you have with your health care provider. Document Released: 04/29/2010 Document Revised: 06/10/2015 Document Reviewed: 07/06/2014 Elsevier  Interactive Patient Education  2017 [employer].

## 2016-05-14 NOTE — MAU Note (Signed)
Pt presents to MAU with complaints of vaginal bleeding when she wipes. Denies any pain. Was evaluated in MAU last week for the same issue

## 2016-05-15 ENCOUNTER — Telehealth: Payer: Self-pay | Admitting: *Deleted

## 2016-05-15 NOTE — Telephone Encounter (Addendum)
-----   Message from [place], [place] sent at 05/12/2016 10:59 AM EDT ----- Please inform pt of low lying placenta. Otherwise Nml anatomy US. Pelvic rest!!! Needs F/U US around 30-32 wks.  Called pt and informed her of [place] results and plan of care as stated above. Pelvic rest was explained thoroughly.  Pt voiced understanding and had no questions.

## 2016-05-16 LAB — AFP, SERUM, OPEN SPINA BIFIDA
AFP MOM: 1.54
AFP Value: 79.5 ng/mL
Gest. Age on Collection Date: 19 weeks
MATERNAL AGE AT EDD: 24.5 a
OSBR RISK 1 IN: 2457
TEST RESULTS AFP: NEGATIVE
Weight: 138 [lb_av]

## 2016-05-22 ENCOUNTER — Telehealth: Payer: Self-pay | Admitting: Advanced Practice Midwife

## 2016-05-22 NOTE — Telephone Encounter (Signed)
Returned call from pt. Message left.

## 2016-05-23 ENCOUNTER — Encounter: Payer: Self-pay | Admitting: Advanced Practice Midwife

## 2016-05-23 DIAGNOSIS — F1210 Cannabis abuse, uncomplicated: Secondary | ICD-10-CM | POA: Insufficient documentation

## 2016-05-23 LAB — PRESCRIPTION ABUSE MONITORING 17P, URINE
6-Acetylmorphine, Urine: NEGATIVE ng/mL
AMPHETAMINE SCREEN URINE: NEGATIVE ng/mL
BARBITURATE SCREEN URINE: NEGATIVE ng/mL
BENZODIAZEPINE SCREEN, URINE: NEGATIVE ng/mL
BUPRENORPHINE, URINE: NEGATIVE ng/mL
CARISOPRODOL/MEPROBAMATE, UR: NEGATIVE ng/mL
COCAINE(METAB.)SCREEN, URINE: NEGATIVE ng/mL
CREATININE(CRT), U: 20.1 mg/dL (ref 20.0–300.0)
EDDP, Urine: NEGATIVE ng/mL
Fentanyl, Urine: NEGATIVE pg/mL
MDMA SCREEN, URINE: NEGATIVE ng/mL
METHADONE SCREEN, URINE: NEGATIVE ng/mL
Meperidine Screen, Urine: NEGATIVE ng/mL
Nitrite Urine, Quantitative: NEGATIVE ug/mL
OPIATE SCREEN URINE: NEGATIVE ng/mL
OXYCODONE+OXYMORPHONE UR QL SCN: NEGATIVE ng/mL
PHENCYCLIDINE QUANTITATIVE URINE: NEGATIVE ng/mL
Ph of Urine: 7.1 (ref 4.5–8.9)
Propoxyphene Scrn, Ur: NEGATIVE ng/mL
SPECIFIC GRAVITY: 1.006
TAPENTADOL, URINE: NEGATIVE ng/mL
Tramadol Screen, Urine: NEGATIVE ng/mL

## 2016-05-23 LAB — CANNABINOID (GC/MS), URINE
CANNABINOID UR: POSITIVE — AB
Carboxy THC (GC/MS): 14 ng/mL

## 2016-05-24 ENCOUNTER — Inpatient Hospital Stay (HOSPITAL_COMMUNITY)
Admission: AD | Admit: 2016-05-24 | Discharge: 2016-05-25 | Disposition: A | Discharge status: Home / Self Care | Payer: Medicaid Other | Source: Ambulatory Visit | Attending: Obstetrics & Gynecology | Admitting: Obstetrics & Gynecology

## 2016-05-24 ENCOUNTER — Inpatient Hospital Stay (HOSPITAL_COMMUNITY): Payer: Medicaid Other

## 2016-05-24 DIAGNOSIS — O99512 Diseases of the respiratory system complicating pregnancy, second trimester: Secondary | ICD-10-CM | POA: Diagnosis not present

## 2016-05-24 DIAGNOSIS — O4702 False labor before 37 completed weeks of gestation, second trimester: Secondary | ICD-10-CM | POA: Insufficient documentation

## 2016-05-24 DIAGNOSIS — J45909 Unspecified asthma, uncomplicated: Secondary | ICD-10-CM | POA: Diagnosis not present

## 2016-05-24 DIAGNOSIS — Z888 Allergy status to other drugs, medicaments and biological substances status: Secondary | ICD-10-CM | POA: Diagnosis not present

## 2016-05-24 DIAGNOSIS — Z818 Family history of other mental and behavioral disorders: Secondary | ICD-10-CM | POA: Insufficient documentation

## 2016-05-24 DIAGNOSIS — O4442 Low lying placenta NOS or without hemorrhage, second trimester: Secondary | ICD-10-CM | POA: Insufficient documentation

## 2016-05-24 DIAGNOSIS — Z9109 Other allergy status, other than to drugs and biological substances: Secondary | ICD-10-CM | POA: Diagnosis not present

## 2016-05-24 DIAGNOSIS — Z8249 Family history of ischemic heart disease and other diseases of the circulatory system: Secondary | ICD-10-CM | POA: Insufficient documentation

## 2016-05-24 DIAGNOSIS — Z9889 Other specified postprocedural states: Secondary | ICD-10-CM | POA: Insufficient documentation

## 2016-05-24 DIAGNOSIS — Z881 Allergy status to other antibiotic agents status: Secondary | ICD-10-CM | POA: Insufficient documentation

## 2016-05-24 DIAGNOSIS — R109 Unspecified abdominal pain: Secondary | ICD-10-CM | POA: Insufficient documentation

## 2016-05-24 DIAGNOSIS — F329 Major depressive disorder, single episode, unspecified: Secondary | ICD-10-CM | POA: Diagnosis not present

## 2016-05-24 DIAGNOSIS — O26892 Other specified pregnancy related conditions, second trimester: Secondary | ICD-10-CM | POA: Insufficient documentation

## 2016-05-24 DIAGNOSIS — Z823 Family history of stroke: Secondary | ICD-10-CM | POA: Diagnosis not present

## 2016-05-24 DIAGNOSIS — M549 Dorsalgia, unspecified: Secondary | ICD-10-CM | POA: Diagnosis present

## 2016-05-24 DIAGNOSIS — Z3A21 21 weeks gestation of pregnancy: Secondary | ICD-10-CM | POA: Diagnosis not present

## 2016-05-24 DIAGNOSIS — Z833 Family history of diabetes mellitus: Secondary | ICD-10-CM | POA: Insufficient documentation

## 2016-05-24 DIAGNOSIS — F419 Anxiety disorder, unspecified: Secondary | ICD-10-CM | POA: Insufficient documentation

## 2016-05-24 DIAGNOSIS — O99342 Other mental disorders complicating pregnancy, second trimester: Secondary | ICD-10-CM | POA: Diagnosis not present

## 2016-05-24 LAB — URINALYSIS, ROUTINE W REFLEX MICROSCOPIC
BACTERIA UA: NONE SEEN
BILIRUBIN URINE: NEGATIVE
Glucose, UA: NEGATIVE mg/dL
Hgb urine dipstick: NEGATIVE
KETONES UR: NEGATIVE mg/dL
Nitrite: NEGATIVE
PH: 7 (ref 5.0–8.0)
Protein, ur: NEGATIVE mg/dL
Specific Gravity, Urine: 1.014 (ref 1.005–1.030)

## 2016-05-24 NOTE — MAU Note (Signed)
Low back pain and low abd cramps since 5 pm.  No bleeding. No leaking. Baby moving well.

## 2016-05-25 DIAGNOSIS — O4442 Low lying placenta NOS or without hemorrhage, second trimester: Secondary | ICD-10-CM

## 2016-05-25 DIAGNOSIS — O4702 False labor before 37 completed weeks of gestation, second trimester: Secondary | ICD-10-CM

## 2016-05-25 NOTE — Discharge Instructions (Signed)
Preterm Birth °Preterm birth is a birth that happens before 37 weeks of pregnancy. Most pregnancies last about 39-41 weeks. Every week in the womb is important and is beneficial to the health of the infant. Infants born before 37 weeks of pregnancy are at a higher risk for complications. Depending on when the infant was born, he or she may be: °· Late preterm. Born between 32 weeks and 37 weeks of pregnancy. °· Very preterm. Born at less than 32 weeks of pregnancy. °· Extremely preterm. Born at less than 25 weeks of pregnancy. °The earlier a baby is born, the more likely the child will have issues related to prematurity. Complications and problems that can be seen in infants born too early include: °· Problems breathing (respiratory distress syndrome). °· Low birth weight. °· Problems feeding. °· Sleeping problems. °· Yellowing of the skin (jaundice). °· Infections such as pneumonia. °Babies born very preterm or extremely preterm are at risk for more serious medical issues. These include: °· More severe breathing issues. °· Eyesight issues. °· Brain development issues (intraventricular hemorrhage). °· Behavioral and emotional development issues. °· Growth and developmental delays. °· Cerebral palsy. °· Serious feeding or bowel complications (necrotizing enterocolitis). °What are the causes? °There are two broad categories of preterm birth. °· Spontaneous preterm birth. This is a birth resulting from preterm labor (not medically induced) or preterm premature rupture of membranes (PPROM). °· Indicated preterm birth. This is a birth resulting from labor being medically induced due to health, personal, or social reasons. °What increases the risk? °Preterm birth may be related to certain medical conditions, lifestyle factors, or demographic factors encountered by the mother or fetus. °· Medical conditions include: °¨ Multiple gestations (twins, triplets, and so on). °¨ Infection. °¨ Diabetes. °¨ Heart disease. °¨ Kidney  disease. °¨ Cervical or uterine abnormalities. °¨ Being underweight. °¨ High blood pressure or preeclampsia. °¨ Premature rupture of membranes (PROM). °¨ Birth defects in the fetus. °· Lifestyle factors include: °¨ Poor prenatal care. °¨ Poor nutrition or anemia. °¨ Cigarette smoking. °¨ Consuming alcohol. °¨ High levels of stress and lack of social or emotional support. °¨ Exposure to chemical or environmental toxins. °¨ Substance abuse. °· Demographic factors include: °¨ African-American ethnicity. °¨ Age (younger than 18 or older than 24 years of age). °¨ Low socioeconomic status. °Women with a history of preterm labor or who become pregnant within 18 months of giving birth are also at increased risk for preterm birth. °How is this diagnosed? °Your health care provider may request additional tests to diagnose underlying complications resulting from preterm birth. Tests on the infant may include: °· Physical exam. °· Blood tests. °· Chest X-rays. °· Heart-lung monitoring. °How is this treated? °After birth, special care will be taken to assess any problems or complications for the infant. Supportive care will be provided for the infant. Treatment depends on what problems are present and any complications that develop. Some preterm infants are cared for in a neonatal intensive care unit. In general, care may include: °· Maintaining temperature and oxygen in a clear heated box (baby isolette). °· Monitoring the infant's heart rate, breathing, and level of oxygen in the blood. °· Monitoring for signs of infection and, if needed, giving IV antibiotic medicine. °· Inserting a feeding tube (nose, mouth) or giving IV nutrition if unable to feed. °· Inserting a breathing tube (ventilation). °· Respiration support (continuous positive airway pressure [CPAP] or oxygen). °Treatment will change as the infant builds up strength and is able   to breathe and eat on his or her own. For some infants, no special treatment is  necessary. Parents may be educated on the potential health risks of prematurity to the infant. °Follow these instructions at home: °· Understand your infant's special conditions and needs. It may be reassuring to learn about infant CPR. °· Monitor your infant in the car seat until he or she grows and matures. Infant car seats can cause breathing difficulties for preterm infants. °· Keep your infant warm. Dress your infant in layers and keep him or her away from drafts, especially in cold months of the year. °· Wash your hands thoroughly after going to the bathroom or changing a diaper. Late preterm infants may be more prone to infection. °· Follow all your health care provider's instructions for providing support and care to your preterm infant. °· Get support from organizations and groups that understand your challenges. °· Follow up with your infant's health care provider as directed. °Prevention  °There are some things you can do to help lower your risk of having a preterm infant in the future. These include: °· Good prenatal care throughout the entire pregnancy. See a health care provider regularly for advice and tests. °· Management of underlying medical conditions. °· Proper self-care and lifestyle changes. °· Proper diet and weight control. °· Watching for signs of various infections. °Where to find more information: °March of Dimes: www.marchofdimes.com °Prematurity.org: www.prematurity.org °Contact a health care provider if: °· Your infant has feeding difficulties. °· Your infant has sleeping difficulties. °· Your infant has breathing difficulties. °· Your infant's skin starts to look yellow. °· Your infant shows signs of infection, such as a stuffy nose, fever, crying, or bluish color of the skin. °This information is not intended to replace advice given to you by your health care provider. Make sure you discuss any questions you have with your health care provider. °Document Released: 03/25/2003 Document  Revised: 06/10/2015 Document Reviewed: 08/01/2012 °Elsevier Interactive Patient Education © 2017 Elsevier Inc. ° °

## 2016-05-25 NOTE — MAU Provider Note (Signed)
Chief Complaint: Back Pain and Abdominal Pain   First Provider Initiated Contact with Patient 05/24/16 2215     SUBJECTIVE HPI: Monica Munoz is a 26 y.o. G3P2001 at 2573w0d who presents to Maternity Admissions reporting ~10 contractions per day. Worried because they have gotten more painful and because she has a low-lying placenta.  Modifying factors: No improvement w/ rest and PO hydration. Showers help. Associated signs and symptoms: Neg for VB, LOF, vaginal discharge, urinary complaints, GI complaints.   Past Medical History:  Diagnosis Date  . Anxiety   . Asthma   . Depression   . Migraine with aura 04/25/2012   OB History  Gravida Para Term Preterm AB Living  3 2 2  0 0 1  SAB TAB Ectopic Multiple Live Births  0 0 0 0 2    # Outcome Date GA Lbr Len/2nd Weight Sex Delivery Anes PTL Lv  3 Current           2 Term 10/13/14 1166w5d 10:42 / 00:15 8 lb 3 oz (3.714 kg) F Vag-Spont EPI  DEC  1 Term 05/27/13 1673w0d 02:30 / 02:19 8 lb 10.3 oz (3.92 kg) M Vag-Spont EPI  LIV    Obstetric Comments  Deceased at 11 months from cyst in throat.    Past Surgical History:  Procedure Laterality Date  . WISDOM TOOTH EXTRACTION     Social History   Social History  . Marital status: Single    Spouse name: N/A  . Number of children: N/A  . Years of education: N/A   Occupational History  . Not on file.   Social History Main Topics  . Smoking status: Never Smoker  . Smokeless tobacco: Never Used  . Alcohol use No  . Drug use: No  . Sexual activity: Yes    Birth control/ protection: None   Other Topics Concern  . Not on file   Social History Narrative  . No narrative on file   Family History  Problem Relation Age of Onset  . Diabetes Maternal Grandfather   . Hyperlipidemia Maternal Grandfather   . Stroke Maternal Grandfather   . Heart murmur Mother   . Miscarriages / [place] Mother   . Diabetes Mother   . Autism Paternal Uncle   . Down syndrome Paternal Uncle   . Stroke  Maternal Grandmother    No current facility-administered medications on file prior to encounter.    Current Outpatient Prescriptions on File Prior to Encounter  Medication Sig Dispense Refill  . metroNIDAZOLE (FLAGYL) 500 MG tablet Take 1 tablet (500 mg total) by mouth 2 (two) times daily. (Patient not taking: Reported on 05/11/2016) 14 tablet 0  . ondansetron (ZOFRAN-ODT) 4 MG disintegrating tablet Take 1 tablet (4 mg total) by mouth every 8 (eight) hours as needed for nausea or vomiting. 20 tablet 3  . Prenatal Vit-Fe Fumarate-FA (PRENATAL MULTIVITAMIN) TABS tablet Take 1 tablet by mouth at bedtime.      Allergies  Allergen Reactions  . Benadryl [Diphenhydramine Hcl] Other (See Comments)    Reaction:  Red Man Syndrome   . Reglan [Metoclopramide] Shortness Of Breath    Chest pain, face numb and red  . Lorazepam Other (See Comments)    Pt states that this med makes her feel "loopy".   . Vancomycin Itching  . Adhesive [Tape] Itching and Dermatitis    I have reviewed patient's Past Medical Hx, Surgical Hx, Family Hx, Social Hx, medications and allergies.   Review of Systems  Constitutional:  Negative for chills and fever.  Gastrointestinal: Positive for abdominal pain (contractions only). Negative for constipation, diarrhea, nausea and vomiting.  Genitourinary: Negative for dysuria, flank pain, frequency, hematuria, vaginal bleeding and vaginal discharge.    OBJECTIVE Patient Vitals for the past 24 hrs:  BP Temp Temp src Pulse Resp SpO2 Height Weight  05/25/16 0028 116/65 - - 68 18 - - -  05/24/16 2132 102/61 98.1 F (36.7 C) Oral 67 16 98 % 5\' 5"  (1.651 m) 140 lb 12 oz (63.8 kg)   Constitutional: Well-developed, well-nourished female in no acute distress.  Cardiovascular: normal rate Respiratory: normal rate and effort.  GI: Abd soft, non-tender, gravid appropriate for gestational age.  Neurologic: Alert and oriented x 4.  GU: Deferred due to LL Placenta.  LAB  RESULTS Results for orders placed or performed during the hospital encounter of 05/24/16 (from the past 24 hour(s))  Urinalysis, Routine w reflex microscopic     Status: Abnormal   Collection Time: 05/24/16  9:37 PM  Result Value Ref Range   Color, Urine YELLOW YELLOW   APPearance TURBID (A) CLEAR   Specific Gravity, Urine 1.014 1.005 - 1.030   pH 7.0 5.0 - 8.0   Glucose, UA NEGATIVE NEGATIVE mg/dL   Hgb urine dipstick NEGATIVE NEGATIVE   Bilirubin Urine NEGATIVE NEGATIVE   Ketones, ur NEGATIVE NEGATIVE mg/dL   Protein, ur NEGATIVE NEGATIVE mg/dL   Nitrite NEGATIVE NEGATIVE   Leukocytes, UA SMALL (A) NEGATIVE   RBC / HPF 0-5 0 - 5 RBC/hpf   WBC, UA TOO NUMEROUS TO COUNT 0 - 5 WBC/hpf   Bacteria, UA NONE SEEN NONE SEEN   Squamous Epithelial / LPF 0-5 (A) NONE SEEN   Mucous PRESENT     IMAGING CL 4 cm. Placenta still low lying.  MAU COURSE Orders Placed This Encounter  Procedures  . Culture, OB Urine  . [place] MFM OB LIMITED  . Urinalysis, Routine w reflex microscopic  . Discharge patient    MDM - Preterm contractions w/ out shortening or funneling of cervix.  ASSESSMENT 1. Preterm uterine contractions in second trimester, antepartum   2. Abdominal pain during pregnancy, second trimester   3. Low-lying placenta without hemorrhage, second trimester     PLAN Discharge home in stable condition. Preterm labor precautions Continue pelvic rest Follow-up Information    [facility] Follow up.   Specialty:  Obstetrics and Gynecology Why:  as scheduled or sooner as needed if symptoms worsen Contact information: [address] [place] [address] [phone]       [facility] [place] [facility]ERN[facility] ADMISS[facility] Follow up.   Why:  in pregnancy emergencies Contact information: [address] [id number] mc [place] [address] [phone]         Allergies as of 05/25/2016      Reactions    Benadryl [diphenhydramine Hcl] Other (See Comments)   Reaction:  Red Man Syndrome    Reglan [metoclopramide] Shortness Of Breath   Chest pain, face numb and red   Lorazepam Other (See Comments)   Pt states that this med makes her feel "loopy".    Vancomycin Itching   Adhesive [tape] Itching, Dermatitis      Medication List    STOP taking these medications   metroNIDAZOLE 500 MG tablet Commonly known as:  FLAGYL     TAKE these medications   ondansetron 4 MG disintegrating tablet Commonly known as:  ZOFRAN ODT Take 1 tablet (4 mg total)  by mouth every 8 (eight) hours as needed for nausea or vomiting.   prenatal multivitamin Tabs tablet Take 1 tablet by mouth at bedtime.        Katrinka Blazing, [place], CNM 05/25/2016  12:40 AM

## 2016-05-26 LAB — CULTURE, OB URINE: Culture: NO GROWTH

## 2016-06-08 ENCOUNTER — Ambulatory Visit (INDEPENDENT_AMBULATORY_CARE_PROVIDER_SITE_OTHER): Payer: Self-pay | Admitting: Advanced Practice Midwife

## 2016-06-08 VITALS — BP 123/82 | HR 83 | Wt 143.6 lb

## 2016-06-08 DIAGNOSIS — O4442 Low lying placenta NOS or without hemorrhage, second trimester: Secondary | ICD-10-CM

## 2016-06-08 DIAGNOSIS — Z3A25 25 weeks gestation of pregnancy: Secondary | ICD-10-CM

## 2016-06-08 DIAGNOSIS — Z3482 Encounter for supervision of other normal pregnancy, second trimester: Secondary | ICD-10-CM

## 2016-06-08 DIAGNOSIS — O26892 Other specified pregnancy related conditions, second trimester: Secondary | ICD-10-CM

## 2016-06-08 DIAGNOSIS — O26899 Other specified pregnancy related conditions, unspecified trimester: Secondary | ICD-10-CM

## 2016-06-08 DIAGNOSIS — R102 Pelvic and perineal pain: Secondary | ICD-10-CM

## 2016-06-08 DIAGNOSIS — Z3480 Encounter for supervision of other normal pregnancy, unspecified trimester: Secondary | ICD-10-CM

## 2016-06-08 DIAGNOSIS — G5600 Carpal tunnel syndrome, unspecified upper limb: Secondary | ICD-10-CM

## 2016-06-08 NOTE — Patient Instructions (Addendum)
Placenta Previa Placenta previa is a condition in which the placenta implants in the lower part of the uterus in pregnant women. The placenta either partially or completely covers the opening to the cervix. This is a problem because the baby must pass through the cervix during delivery. There are three types of placenta previa:  Marginal placenta previa. The placenta reaches within an inch (2.5 cm) of the cervical opening but does not cover it.  Partial placenta previa. The placenta covers part of the cervical opening.  Complete placenta previa. The placenta covers the entire cervical opening. If the previa is marginal or partial and it is diagnosed in the first half of pregnancy, the placenta may move into a normal position as the pregnancy progresses and may no longer cover the cervix. It is important to keep all prenatal visits with your health care provider so you can be more closely monitored. What are the causes? The cause of this condition is not known. What increases the risk? This condition is more likely to develop in women who:  Are carrying more than one baby (multiples).  Have an abnormally shaped uterus.  Have scars on the lining of the uterus.  Have had surgeries involving the uterus, such as a cesarean delivery.  Have delivered a baby before.  Have a history of placenta previa.  Have smoked or used cocaine during pregnancy.  Are age 35 or older during pregnancy. What are the signs or symptoms? The main symptom of this condition is sudden, painless vaginal bleeding during the second half of pregnancy. The amount of bleeding can be very light at first, and it usually stops on its own. Heavier bleeding episodes may also happen. Some women with placenta previa may have no bleeding at all. How is this diagnosed?  This condition is diagnosed:  From an ultrasound. This test uses sound waves to find where the placenta is located before you have any bleeding  episodes.  During a checkup after vaginal bleeding is noticed.  If you are diagnosed with a partial or complete previa, digital exams with fingers will generally be avoided. Your health care provider will still perform a speculum exam.  If you did not have an ultrasound during your pregnancy, placenta previa may not be diagnosed until bleeding occurs during labor. How is this treated? Treatment for this condition may include:  Decreased activity.  Bed rest at home or in the hospital.  Pelvic rest. Nothing is placed inside the vagina during pelvic rest. This means not having sex and not using tampons or douches.  A blood transfusion to replace blood that you have lost (maternal blood loss).  A cesarean delivery. This may be performed if:  The bleeding is heavy and cannot be controlled.  The placenta completely covers the cervix.  Medicines to stop premature labor or to help the baby's lungs to mature. This treatment may be used if you need delivery before your pregnancy is full-term. Your treatment will be decided based on:  How much you are bleeding, or whether the bleeding has stopped.  How far along you are in your pregnancy.  The condition of your baby.  The type of placenta previa that you have. Follow these instructions at home:  Get plenty of rest and lessen activity as told by your health care provider.  Stay on bed rest for as long as told by your health care provider.  Do not have sex, use tampons, use a douche, or place anything inside of your   vagina if your health care provider recommended pelvic rest.  Take over-the-counter and prescription medicines as told by your health care provider.  Keep all follow-up visits as told by your health care provider. This is important. Get help right away if:  You have vaginal bleeding, even if in small amounts and even if you have no pain.  You have cramping or regular contractions.  You have pain in your abdomen or  your lower back.  You have a feeling of increased pressure in your pelvis.  You have increased watery or bloody mucus from the vagina. This information is not intended to replace advice given to you by your health care provider. Make sure you discuss any questions you have with your health care provider. Document Released: 01/02/2005 Document Revised: 09/22/2015 Document Reviewed: 07/17/2015 Elsevier Interactive Patient Education  2017 [employer] Inc.  Round Ligament Pain The round ligament is a cord of muscle and tissue that helps to support the uterus. It can become a source of pain during pregnancy if it becomes stretched or twisted as the baby grows. The pain usually begins in the second trimester of pregnancy, and it can come and go until the baby is delivered. It is not a serious problem, and it does not cause harm to the baby. Round ligament pain is usually a short, sharp, and pinching pain, but it can also be a dull, lingering, and aching pain. The pain is felt in the lower side of the abdomen or in the groin. It usually starts deep in the groin and moves up to the outside of the hip area. Pain can occur with:  A sudden change in position.  Rolling over in bed.  Coughing or sneezing.  Physical activity. Follow these instructions at home: Watch your condition for any changes. Take these steps to help with your pain:  When the pain starts, relax. Then try:  Sitting down.  Flexing your knees up to your abdomen.  Lying on your side with one pillow under your abdomen and another pillow between your legs.  Sitting in a warm bath for 15-20 minutes or until the pain goes away.  Take over-the-counter and prescription medicines only as told by your health care provider.  Move slowly when you sit and stand.  Avoid long walks if they cause pain.  Stop or lessen your physical activities if they cause pain. Contact a health care provider if:  Your pain does not go away with  treatment.  You feel pain in your back that you did not have before.  Your medicine is not helping. Get help right away if:  You develop a fever or chills.  You develop uterine contractions.  You develop vaginal bleeding.  You develop nausea or vomiting.  You develop diarrhea.  You have pain when you urinate. This information is not intended to replace advice given to you by your health care provider. Make sure you discuss any questions you have with your health care provider. Document Released: 10/12/2007 Document Revised: 06/10/2015 Document Reviewed: 03/11/2014 Elsevier Interactive Patient Education  2017 [employer]

## 2016-06-08 NOTE — Progress Notes (Signed)
   PRENATAL VISIT NOTE  Subjective:  Monica Munoz is a 26 y.o. G3P2001 at 7058w0d being seen today for ongoing prenatal care.  She is currently monitored for the following issues for this low-risk pregnancy and has Anxiety state, unspecified; Asthma, mild intermittent; Depression; Headache, migraine; Pelvic inflammatory disease; Fitz-Hugh-Curtis syndrome; Tachycardia; Pelvic pain in female; Supervision of normal pregnancy, antepartum; Death of infant; Low-lying placenta in second trimester; and Marijuana abuse on her problem list.  Patient reports backache and intermittent sharp abdominal pain and episode of left arm tingling and pain yesterday that resolved.  Contractions: Not present. Vag. Bleeding: None.  Movement: Present. Denies leaking of fluid.   The following portions of the patient's history were reviewed and updated as appropriate: allergies, current medications, past family history, past medical history, past social history, past surgical history and problem list. Problem list updated.  Objective:   Vitals:   06/08/16 0942  BP: 123/82  Pulse: 83  Weight: 143 lb 9.6 oz (65.1 kg)    Fetal Status: Fetal Heart Rate (bpm): 140 Fundal Height: 23 cm Movement: Present     General:  Alert, oriented and cooperative. Patient is in no acute distress.  Skin: Skin is warm and dry. No rash noted.   Cardiovascular: Normal heart rate noted  Respiratory: Normal respiratory effort, no problems with respiration noted  Abdomen: Soft, gravid, appropriate for gestational age. Pain/Pressure: Present     Pelvic:  Cervical exam deferred        Extremities: Normal range of motion.  Edema: None  Mental Status: Normal mood and affect. Normal behavior. Normal judgment and thought content.   Assessment and Plan:  Pregnancy: G3P2001 at 2458w0d  1. Supervision of other normal pregnancy, antepartum   2. Low lying placenta nos or without hemorrhage, second trimester --Still low-lying on 5/9. Pt remains on  pelvic rest until resolved. - US MFM OB FOLLOW UP; Future  3. Pain of round ligament affecting pregnancy, antepartum --Rest/ice/heat/warm bath/Tyenol/pregnancy support belt for pain  4. Carpal tunnel syndrome during pregnancy --Likely physiologic, pt to report if arm pain continues or worsens  Preterm labor symptoms and general obstetric precautions including but not limited to vaginal bleeding, contractions, leaking of fluid and fetal movement were reviewed in detail with the patient. Please refer to After Visit Summary for other counseling recommendations.  Return in about 4 weeks (around 07/06/2016).   Sharen CounterLisa Leftwich-Kirby, CNM

## 2016-06-14 ENCOUNTER — Telehealth: Payer: Self-pay

## 2016-06-14 NOTE — Telephone Encounter (Signed)
-----   Message from [place], [place] sent at 05/18/2016 12:49 PM EDT ----- Regarding: RE: Will you tell her I'm not in the office the rest of this week and see if she'll tell you what's going on. You can send me a question by in-basket. ----- Message ----- From: Bea Graffemetrice Trudi Morgenthaler, RMA Sent: 05/17/2016   3:52 PM To: Dorathy KinsmanVirginia Smith, CNM    ----- Message ----- From: Jonathon BellowsBeronica Mendez Sent: 05/17/2016   2:36 PM To: Dorathy KinsmanVirginia Smith, CNM, [facility] Pool  Patient requesting to speak to [place], did not want to talk to nurse. Stated that she was told to call you if something changed and did not elaborate on what was going on.  Her # [phone]  Let me know if you wanted me to reach out.  Thanks!

## 2016-06-14 NOTE — Telephone Encounter (Signed)
Returned patient call no answer or voice mail to follow up on question from [place] last note.

## 2016-06-21 ENCOUNTER — Ambulatory Visit (HOSPITAL_COMMUNITY): Payer: Medicaid Other

## 2016-06-22 ENCOUNTER — Other Ambulatory Visit: Payer: Self-pay | Admitting: Advanced Practice Midwife

## 2016-06-22 ENCOUNTER — Ambulatory Visit (HOSPITAL_COMMUNITY)
Admission: RE | Admit: 2016-06-22 | Discharge: 2016-06-22 | Disposition: A | Discharge status: Home / Self Care | Payer: Medicaid Other | Source: Ambulatory Visit | Attending: Advanced Practice Midwife | Admitting: Advanced Practice Midwife

## 2016-06-22 DIAGNOSIS — O2690 Pregnancy related conditions, unspecified, unspecified trimester: Secondary | ICD-10-CM

## 2016-06-22 DIAGNOSIS — O9989 Other specified diseases and conditions complicating pregnancy, childbirth and the puerperium: Secondary | ICD-10-CM | POA: Diagnosis not present

## 2016-06-22 DIAGNOSIS — O2692 Pregnancy related conditions, unspecified, second trimester: Secondary | ICD-10-CM | POA: Diagnosis not present

## 2016-06-22 DIAGNOSIS — O99340 Other mental disorders complicating pregnancy, unspecified trimester: Secondary | ICD-10-CM

## 2016-06-22 DIAGNOSIS — J45909 Unspecified asthma, uncomplicated: Secondary | ICD-10-CM | POA: Insufficient documentation

## 2016-06-22 DIAGNOSIS — Z3A25 25 weeks gestation of pregnancy: Secondary | ICD-10-CM | POA: Insufficient documentation

## 2016-06-22 DIAGNOSIS — O99342 Other mental disorders complicating pregnancy, second trimester: Secondary | ICD-10-CM | POA: Diagnosis not present

## 2016-06-22 DIAGNOSIS — O4442 Low lying placenta NOS or without hemorrhage, second trimester: Secondary | ICD-10-CM | POA: Diagnosis present

## 2016-06-30 ENCOUNTER — Encounter (HOSPITAL_COMMUNITY): Payer: Self-pay | Admitting: *Deleted

## 2016-06-30 ENCOUNTER — Inpatient Hospital Stay (HOSPITAL_COMMUNITY)
Admission: AD | Admit: 2016-06-30 | Discharge: 2016-07-01 | Disposition: A | Discharge status: Home / Self Care | Payer: Medicaid Other | Source: Ambulatory Visit | Attending: Obstetrics and Gynecology | Admitting: Obstetrics and Gynecology

## 2016-06-30 DIAGNOSIS — Z881 Allergy status to other antibiotic agents status: Secondary | ICD-10-CM | POA: Diagnosis not present

## 2016-06-30 DIAGNOSIS — O26892 Other specified pregnancy related conditions, second trimester: Secondary | ICD-10-CM | POA: Insufficient documentation

## 2016-06-30 DIAGNOSIS — Z3A26 26 weeks gestation of pregnancy: Secondary | ICD-10-CM | POA: Diagnosis not present

## 2016-06-30 DIAGNOSIS — Z3480 Encounter for supervision of other normal pregnancy, unspecified trimester: Secondary | ICD-10-CM

## 2016-06-30 DIAGNOSIS — R109 Unspecified abdominal pain: Secondary | ICD-10-CM | POA: Diagnosis present

## 2016-06-30 DIAGNOSIS — N859 Noninflammatory disorder of uterus, unspecified: Secondary | ICD-10-CM

## 2016-06-30 DIAGNOSIS — N858 Other specified noninflammatory disorders of uterus: Secondary | ICD-10-CM

## 2016-06-30 DIAGNOSIS — O4442 Low lying placenta NOS or without hemorrhage, second trimester: Secondary | ICD-10-CM

## 2016-06-30 LAB — URINALYSIS, ROUTINE W REFLEX MICROSCOPIC
BILIRUBIN URINE: NEGATIVE
GLUCOSE, UA: NEGATIVE mg/dL
HGB URINE DIPSTICK: NEGATIVE
KETONES UR: NEGATIVE mg/dL
NITRITE: NEGATIVE
PH: 6 (ref 5.0–8.0)
Protein, ur: NEGATIVE mg/dL
SPECIFIC GRAVITY, URINE: 1.015 (ref 1.005–1.030)

## 2016-06-30 NOTE — MAU Note (Addendum)
Cramping since this morning and stomach gets hard. Denies LOF or bleeding. Hx low lying placenta but pt states is ok now

## 2016-07-01 DIAGNOSIS — O9989 Other specified diseases and conditions complicating pregnancy, childbirth and the puerperium: Secondary | ICD-10-CM | POA: Diagnosis not present

## 2016-07-01 DIAGNOSIS — N859 Noninflammatory disorder of uterus, unspecified: Secondary | ICD-10-CM

## 2016-07-01 DIAGNOSIS — O26892 Other specified pregnancy related conditions, second trimester: Secondary | ICD-10-CM | POA: Diagnosis not present

## 2016-07-01 NOTE — MAU Provider Note (Signed)
Chief Complaint:  Abdominal Cramping   Seen by provider at 0005   HPI: Monica Munoz is a 26 y.o. G3P2001 at 73w2dwho presents to maternity admissions reporting pelvic cramping with no bleeding.  Started this morning. Did have sex this morning. . She reports good fetal movement, denies LOF, vaginal bleeding, vaginal itching/burning, urinary symptoms, h/a, dizziness, n/v, diarrhea, constipation or fever/chills.    Abdominal Cramping  This is a new problem. The current episode started today. The onset quality is gradual. The problem occurs intermittently. The problem has been unchanged. The pain is located in the LLQ and RLQ. The pain is mild. The quality of the pain is cramping. The abdominal pain does not radiate. Pertinent negatives include no constipation, diarrhea, dysuria, fever, headaches, nausea or vomiting. Nothing aggravates the pain. The pain is relieved by nothing. She has tried nothing for the symptoms.    RN Note: Cramping since this morning and stomach gets hard. Denies LOF or bleeding. Hx low lying placenta but pt states is ok now  Past Medical History: Past Medical History:  Diagnosis Date  . Anxiety   . Asthma   . Depression   . Migraine with aura 04/25/2012    Past obstetric history: OB History  Gravida Para Term Preterm AB Living  3 2 2  0 0 1  SAB TAB Ectopic Multiple Live Births  0 0 0 0 2    # Outcome Date GA Lbr Len/2nd Weight Sex Delivery Anes PTL Lv  3 Current           2 Term 10/13/14 [redacted]w[redacted]d 10:42 / 00:15 8 lb 3 oz (3.714 kg) F Vag-Spont EPI  DEC  1 Term 05/27/13 [redacted]w[redacted]d 02:30 / 02:19 8 lb 10.3 oz (3.92 kg) M Vag-Spont EPI  LIV    Obstetric Comments  Deceased at 11 months from cyst in throat.     Past Surgical History: Past Surgical History:  Procedure Laterality Date  . WISDOM TOOTH EXTRACTION      Family History: Family History  Problem Relation Age of Onset  . Diabetes Maternal Grandfather   . Hyperlipidemia Maternal Grandfather   . Stroke  Maternal Grandfather   . Heart murmur Mother   . Miscarriages / [place] Mother   . Diabetes Mother   . Autism Paternal Uncle   . Down syndrome Paternal Uncle   . Stroke Maternal Grandmother     Social History: Social History  Substance Use Topics  . Smoking status: Never Smoker  . Smokeless tobacco: Never Used  . Alcohol use No    Allergies:  Allergies  Allergen Reactions  . Benadryl [Diphenhydramine Hcl] Other (See Comments)    Reaction:  Red Man Syndrome   . Reglan [Metoclopramide] Shortness Of Breath    Chest pain, face numb and red  . Lorazepam Other (See Comments)    Pt states that this med makes her feel "loopy".   . Lorazepam Other (See Comments)  . Vancomycin Itching  . Adhesive [Tape] Itching and Dermatitis    Meds:  Prescriptions Prior to Admission  Medication Sig Dispense Refill Last Dose  . ondansetron (ZOFRAN-ODT) 4 MG disintegrating tablet Take 1 tablet (4 mg total) by mouth every 8 (eight) hours as needed for nausea or vomiting. 20 tablet 3 Taking  . Prenatal Vit-Fe Fumarate-FA (PRENATAL MULTIVITAMIN) TABS tablet Take 1 tablet by mouth at bedtime.    Taking    I have reviewed patient's Past Medical Hx, Surgical Hx, Family Hx, Social Hx, medications and allergies.  ROS:  Review of Systems  Constitutional: Negative for fever.  Gastrointestinal: Negative for constipation, diarrhea, nausea and vomiting.  Genitourinary: Negative for dysuria.  Neurological: Negative for headaches.   Other systems negative  Physical Exam  Patient Vitals for the past 24 hrs:  BP Temp Pulse Resp Height Weight  06/30/16 2327 118/61 98.2 F (36.8 C) 80 20 5\' 5"  (1.651 m) 151 lb (68.5 kg)   Constitutional: Well-developed, well-nourished female in no acute distress.  Cardiovascular: normal rate and rhythm Respiratory: normal effort, clear to auscultation bilaterally GI: Abd soft, non-tender, gravid appropriate for gestational age.   No rebound or guarding. MS:  Extremities nontender, no edema, normal ROM Neurologic: Alert and oriented x 4.  GU: Neg CVAT.  PELVIC EXAM:  Dilation: Closed Effacement (%): Thick Cervical Position: Posterior Exam by:: Shelia Media  FHT:  Baseline 145 , moderate variability, accelerations present, no decelerations Contractions: Uterine irritability, cramps last only 10 seconds each. No contractions.   Labs: Results for orders placed or performed during the hospital encounter of 06/30/16 (from the past 24 hour(s))  Urinalysis, Routine w reflex microscopic     Status: Abnormal   Collection Time: 06/30/16 11:35 PM  Result Value Ref Range   Color, Urine YELLOW YELLOW   APPearance CLEAR CLEAR   Specific Gravity, Urine 1.015 1.005 - 1.030   pH 6.0 5.0 - 8.0   Glucose, UA NEGATIVE NEGATIVE mg/dL   Hgb urine dipstick NEGATIVE NEGATIVE   Bilirubin Urine NEGATIVE NEGATIVE   Ketones, ur NEGATIVE NEGATIVE mg/dL   Protein, ur NEGATIVE NEGATIVE mg/dL   Nitrite NEGATIVE NEGATIVE   Leukocytes, UA MODERATE (A) NEGATIVE   RBC / HPF 0-5 0 - 5 RBC/hpf   WBC, UA 0-5 0 - 5 WBC/hpf   Bacteria, UA RARE (A) NONE SEEN   Squamous Epithelial / LPF 0-5 (A) NONE SEEN   Mucous PRESENT    A/Positive/-- (03/01 1115)  Imaging:  [place] Mfm Ob Transvaginal  Result Date: 06/22/2016 ----------------------------------------------------------------------  OBSTETRICS REPORT                      (Signed Final 06/22/2016 04:02 pm) ---------------------------------------------------------------------- Patient Info  ID #:       [id number]                          D.O.B.:  [birth year] (26 yrs)  Name:       Monica Munoz                     Visit Date: 06/22/2016 02:55 pm ---------------------------------------------------------------------- Performed By  Performed By:     Eden Lathe BS      Secondary Phy.:   Dorathy Kinsman                    RDMS RVT                                                             CNM  Attending:        Charlsie Merles MD          Address:          [address]                                                             [place]                                                             [id number]  Referred By:      [facility]       Location:         [facility] for                    [facility]                    Healthcare  Ref. Address:     [facility]                    [address]                    [place], [place]                    [address] ---------------------------------------------------------------------- Orders   #  Description                                 Code   1  [place] MFM OB FOLLOW UP                         96295.28   2  [place] MFM OB TRANSVAGINAL                      41324.4  ----------------------------------------------------------------------   #  Ordered By               Order #        Accession #    Episode #   1  LISA LEFTWICH-           [id number]      [id number]     [id number]      KIRBY   2  LISA LEFTWICH-           [id number]      [id number]     [id number]      KIRBY  ---------------------------------------------------------------------- Indications   [redacted] weeks gestation of pregnancy                Z3A.25   Asthma  O99.89 j45.909   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)   Other mental disorder complicating             O99.340   pregnancy, second trimester (anxiety,   depression)   Low lying placenta, antepartum                 O44.40  ---------------------------------------------------------------------- OB History  Blood Type:            Height:  5'5"   Weight (lb):  140       BMI:  23.29  Gravidity:    3         Term:   2        Prem:   0        SAB:   0  TOP:          0       Ectopic:  0        Living: 1 ---------------------------------------------------------------------- Fetal  Evaluation  Num Of Fetuses:     1  Fetal Heart         149  Rate(bpm):  Cardiac Activity:   Observed  Presentation:       Breech  Placenta:           Posterior, above cervical os  P. Cord Insertion:  Visualized  Amniotic Fluid  AFI FV:      Subjectively within normal limits                              Largest Pocket(cm)                              7.43 ---------------------------------------------------------------------- Biometry  BPD:      60.1  mm     G. Age:  24w 4d         25  %    CI:         68.7   %    70 - 86                                                          FL/HC:      19.3   %    18.7 - 20.3  HC:      231.7  mm     G. Age:  25w 1d         36  %    HC/AC:      1.07        1.04 - 1.22  AC:      217.3  mm     G. Age:  26w 2d         77  %    FL/BPD:     74.4   %    71 - 87  FL:       44.7  mm     G. Age:  24w 5d         29  %    FL/AC:      20.6   %    20 - 24  HUM:      42.5  mm     G. Age:  25w 4d         56  %  CER:      28.2  mm     G. Age:  25w 1d         53  %  CM:        8.2  mm  Est. FW:     816  gm    1 lb 13 oz      63  % ---------------------------------------------------------------------- Gestational Age  LMP:           26w 6d        Date:  12/17/15                 EDD:   09/22/16  U/S Today:     25w 1d                                        EDD:   10/04/16  Best:          Alcus Dad 0d     Det. ByMarcella Dubs         EDD:   10/05/16                                      (02/18/16) ---------------------------------------------------------------------- Anatomy  Cranium:               Appears normal         Aortic Arch:            Previously seen  Cavum:                 Appears normal         Ductal Arch:            Appears normal  Ventricles:            Appears normal         Diaphragm:              Appears normal  Choroid Plexus:        Appears normal         Stomach:                Appears normal, left                                                                        sided   Cerebellum:            Appears normal         Abdomen:                Appears normal  Posterior Fossa:       Appears normal         Abdominal Wall:         Previously seen  Nuchal Fold:           Previously seen        Cord Vessels:  Previously seen  Face:                  Appears normal         Kidneys:                Left sided                         (orbits and profile)                                                                        pyelectasis  Lips:                  Appears normal         Bladder:                Appears normal  Thoracic:              Appears normal         Spine:                  Previously seen  Heart:                 Appears normal         Upper Extremities:      Previously seen                         (4CH, axis, and situs  RVOT:                  Appears normal         Lower Extremities:      Previously seen  LVOT:                  Appears normal  Other:  Fetus appears to be a female.Marland Kitchen Heels previously visualized. LT 5th digit          previously visualized. Technically difficult due to fetal position. ---------------------------------------------------------------------- Cervix Uterus Adnexa  Cervix  Length:            3.1  cm.  Normal appearance by transvaginal scan  Uterus  No abnormality visualized.  Left Ovary  Not visualized.  Right Ovary  Not visualized.  Cul De Sac:   No free fluid seen.  Adnexa:       No abnormality visualized. ---------------------------------------------------------------------- Impression  Single living intrauterine pregnancy at [redacted]w[redacted]d.  Posterior placenta, no longer low-lying.  Appropriate fetal growth (63%).  Normal amniotic fluid volume.  Normal fetal anatomy. ---------------------------------------------------------------------- Recommendations  Follow-up ultrasounds as clinically indicated. ----------------------------------------------------------------------                 Charlsie Merles, MD Electronically Signed Final Report   06/22/2016 04:02  pm ----------------------------------------------------------------------  [place] Mfm Ob Follow Up  Result Date: 06/22/2016 ----------------------------------------------------------------------  OBSTETRICS REPORT                      (Signed Final 06/22/2016 04:02 pm) ---------------------------------------------------------------------- Patient Info  ID #:       [id number]  D.O.B.:  05-20-1990 (26 yrs)  Name:       TANYIAH LAURICH                     Visit Date: 06/22/2016 02:55 pm ---------------------------------------------------------------------- Performed By  Performed By:     Eden Lathe BS      Secondary Phy.:   Dorathy Kinsman                    RDMS RVT                                                             CNM  Attending:        Charlsie Merles MD         Address:          [address]                                                             [address]                                                             [place]                                                             [phone]  Referred By:      [facility]       Location:         [facility] for                    [facility]                    Healthcare  Ref. Address:     [facility]                    [address]                    [place], [place]                    [address] ---------------------------------------------------------------------- Orders   #  Description  Code   1  [place] MFM OB FOLLOW UP                         [id number]   2  [place] MFM OB TRANSVAGINAL                      09811.9  ----------------------------------------------------------------------   #  Ordered By               Order #        Accession #    Episode #   1  LISA LEFTWICH-           [id number]      [id number]     [id number]      KIRBY   2  LISA LEFTWICH-           [id number]      [id number]     [id number]      KIRBY   ---------------------------------------------------------------------- Indications   [redacted] weeks gestation of pregnancy                Z3A.25   Asthma                                         O99.89 j45.909   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)   Other mental disorder complicating             O99.340   pregnancy, second trimester (anxiety,   depression)   Low lying placenta, antepartum                 O44.40  ---------------------------------------------------------------------- OB History  Blood Type:            Height:  5'5"   Weight (lb):  140       BMI:  23.29  Gravidity:    3         Term:   2        Prem:   0        SAB:   0  TOP:          0       Ectopic:  0        Living: 1 ---------------------------------------------------------------------- Fetal Evaluation  Num Of Fetuses:     1  Fetal Heart         149  Rate(bpm):  Cardiac Activity:   Observed  Presentation:       Breech  Placenta:           Posterior, above cervical os  P. Cord Insertion:  Visualized  Amniotic Fluid  AFI FV:      Subjectively within normal limits                              Largest Pocket(cm)                              7.43 ---------------------------------------------------------------------- Biometry  BPD:      60.1  mm     G. Age:  24w 4d         25  %    CI:  68.7   %    70 - 86                                                          FL/HC:      19.3   %    18.7 - 20.3  HC:      231.7  mm     G. Age:  25w 1d         36  %    HC/AC:      1.07        1.04 - 1.22  AC:      217.3  mm     G. Age:  26w 2d         77  %    FL/BPD:     74.4   %    71 - 87  FL:       44.7  mm     G. Age:  24w 5d         29  %    FL/AC:      20.6   %    20 - 24  HUM:      42.5  mm     G. Age:  25w 4d         56  %  CER:      28.2  mm     G. Age:  25w 1d         53  %  CM:        8.2  mm  Est. FW:     816  gm    1 lb 13 oz      63  % ----------------------------------------------------------------------  Gestational Age  LMP:           26w 6d        Date:  12/17/15                 EDD:   09/22/16  U/S Today:     25w 1d                                        EDD:   10/04/16  Best:          25w 0d     Det. By:  Marcella Dubs         EDD:   10/05/16                                      (02/18/16) ---------------------------------------------------------------------- Anatomy  Cranium:               Appears normal         Aortic Arch:            Previously seen  Cavum:                 Appears normal         Ductal Arch:            Appears normal  Ventricles:            Appears  normal         Diaphragm:              Appears normal  Choroid Plexus:        Appears normal         Stomach:                Appears normal, left                                                                        sided  Cerebellum:            Appears normal         Abdomen:                Appears normal  Posterior Fossa:       Appears normal         Abdominal Wall:         Previously seen  Nuchal Fold:           Previously seen        Cord Vessels:           Previously seen  Face:                  Appears normal         Kidneys:                Left sided                         (orbits and profile)                                                                        pyelectasis  Lips:                  Appears normal         Bladder:                Appears normal  Thoracic:              Appears normal         Spine:                  Previously seen  Heart:                 Appears normal         Upper Extremities:      Previously seen                         (4CH, axis, and situs  RVOT:                  Appears normal         Lower Extremities:      Previously seen  LVOT:  Appears normal  Other:  Fetus appears to be a female.Marland Kitchen. Heels previously visualized. LT 5th digit          previously visualized. Technically difficult due to fetal position. ---------------------------------------------------------------------- Cervix Uterus  Adnexa  Cervix  Length:            3.1  cm.  Normal appearance by transvaginal scan  Uterus  No abnormality visualized.  Left Ovary  Not visualized.  Right Ovary  Not visualized.  Cul De Sac:   No free fluid seen.  Adnexa:       No abnormality visualized. ---------------------------------------------------------------------- Impression  Single living intrauterine pregnancy at [redacted]w[redacted]d.  Posterior placenta, no longer low-lying.  Appropriate fetal growth (63%).  Normal amniotic fluid volume.  Normal fetal anatomy. ---------------------------------------------------------------------- Recommendations  Follow-up ultrasounds as clinically indicated. ----------------------------------------------------------------------                 Charlsie MerlesMark Newman, MD Electronically Signed Final Report   06/22/2016 04:02 pm ----------------------------------------------------------------------   MAU Course/MDM: I have ordered labs and reviewed results.  NST reviewed and noted to be quite reactive, even more than expected at this gest age.  Treatments in MAU included PO hydration and monitoring.   Recent intercourse could have contributed to irritability. Will advise temporary  Pelvic rest  Assessment: Single IUP at 4445w2d Uterine irritability without cervical change  Plan: Discharge home Preter Labor precautions and fetal kick counts Follow up in Office for prenatal visits and recheck of cervix  Encouraged to return here or to other Urgent Care/ED if she develops worsening of symptoms, increase in pain, fever, or other concerning symptoms.   Pt stable at time of discharge.  Wynelle BourgeoisMarie Seraiah Nowack CNM, MSN Certified Nurse-Midwife 07/01/2016 12:03 AM

## 2016-07-01 NOTE — Discharge Instructions (Signed)
Preterm Labor and Birth Information (you did NOT have Preterm Labor, this is information about it) Pregnancy normally lasts 39-41 weeks. Preterm labor is when labor starts early. It starts before you have been pregnant for 37 whole weeks. What are the risk factors for preterm labor? Preterm labor is more likely to occur in women who:  Have an infection while pregnant.  Have a cervix that is short.  Have gone into preterm labor before.  Have had surgery on their cervix.  Are younger than age 12.  Are older than age 56.  Are African American.  Are pregnant with two or more babies.  Take street drugs while pregnant.  Smoke while pregnant.  Do not gain enough weight while pregnant.  Got pregnant right after another pregnancy.  What are the symptoms of preterm labor? Symptoms of preterm labor include:  Cramps. The cramps may feel like the cramps some women get during their period. The cramps may happen with watery poop (diarrhea).  Pain in the belly (abdomen).  Pain in the lower back.  Regular contractions or tightening. It may feel like your belly is getting tighter.  Pressure in the lower belly that seems to get stronger.  More fluid (discharge) leaking from the vagina. The fluid may be watery or bloody.  Water breaking.  Why is it important to notice signs of preterm labor? Babies who are born early may not be fully developed. They have a higher chance for:  Long-term heart problems.  Long-term lung problems.  Trouble controlling body systems, like breathing.  Bleeding in the brain.  A condition called cerebral palsy.  Learning difficulties.  Death.  These risks are highest for babies who are born before 34 weeks of pregnancy. How is preterm labor treated? Treatment depends on:  How long you were pregnant.  Your condition.  The health of your baby.  Treatment may involve:  Having a stitch (suture) placed in your cervix. When you give birth,  your cervix opens so the baby can come out. The stitch keeps the cervix from opening too soon.  Staying at the hospital.  Taking or getting medicines, such as: ? Hormone medicines. ? Medicines to stop contractions. ? Medicines to help the babys lungs develop. ? Medicines to prevent your baby from having cerebral palsy.  What should I do if I am in preterm labor? If you think you are going into labor too soon, call your doctor right away. How can I prevent preterm labor?  Do not use any tobacco products. ? Examples of these are cigarettes, chewing tobacco, and e-cigarettes. ? If you need help quitting, ask your doctor.  Do not use street drugs.  Do not use any medicines unless you ask your doctor if they are safe for you.  Talk with your doctor before taking any herbal supplements.  Make sure you gain enough weight.  Watch for infection. If you think you might have an infection, get it checked right away.  If you have gone into preterm labor before, tell your doctor. This information is not intended to replace advice given to you by your health care provider. Make sure you discuss any questions you have with your health care provider. Document Released: 03/31/2008 Document Revised: 06/15/2015 Document Reviewed: 05/26/2015 Elsevier Interactive Patient Education  2018 Else[employer]. Pelvic Rest Pelvic rest may be recommended if:  Your placenta is partially or completely covering the opening of your cervix (placenta previa).  There is bleeding between the wall of the uterus  and the amniotic sac in the first trimester of pregnancy (subchorionic hemorrhage).  You went into labor too early (preterm labor).  Based on your overall health and the health of your baby, your health care provider will decide if pelvic rest is right for you. How do I rest my pelvis? For as long as told by your health care provider:  Do not have sex, sexual stimulation, or an orgasm.  Do not use  tampons. Do not douche. Do not put anything in your vagina.  Do not lift anything that is heavier than 10 lb (4.5 kg).  Avoid activities that take a lot of effort (are strenuous).  Avoid any activity in which your pelvic muscles could become strained.  When should I seek medical care? Seek medical care if you have:  Cramping pain in your lower abdomen.  Vaginal discharge.  A low, dull backache.  Regular contractions.  Uterine tightening.  When should I seek immediate medical care? Seek immediate medical care if:  You have vaginal bleeding and you are pregnant.  This information is not intended to replace advice given to you by your health care provider. Make sure you discuss any questions you have with your health care provider. Document Released: 04/29/2010 Document Revised: 06/10/2015 Document Reviewed: 07/06/2014 Elsevier Interactive Patient Education  [employer]2018 Else[employer] Inc[employer]

## 2016-07-06 ENCOUNTER — Encounter: Payer: Self-pay | Admitting: Advanced Practice Midwife

## 2016-07-06 ENCOUNTER — Ambulatory Visit (INDEPENDENT_AMBULATORY_CARE_PROVIDER_SITE_OTHER): Payer: Medicaid Other | Admitting: Advanced Practice Midwife

## 2016-07-06 VITALS — BP 127/65 | HR 64 | Wt 151.9 lb

## 2016-07-06 DIAGNOSIS — Z3490 Encounter for supervision of normal pregnancy, unspecified, unspecified trimester: Secondary | ICD-10-CM

## 2016-07-06 DIAGNOSIS — Z3480 Encounter for supervision of other normal pregnancy, unspecified trimester: Secondary | ICD-10-CM

## 2016-07-06 DIAGNOSIS — Z3482 Encounter for supervision of other normal pregnancy, second trimester: Secondary | ICD-10-CM | POA: Diagnosis not present

## 2016-07-06 DIAGNOSIS — Z23 Encounter for immunization: Secondary | ICD-10-CM | POA: Diagnosis not present

## 2016-07-06 DIAGNOSIS — R51 Headache: Secondary | ICD-10-CM

## 2016-07-06 DIAGNOSIS — R519 Headache, unspecified: Secondary | ICD-10-CM

## 2016-07-06 MED ORDER — TETANUS-DIPHTH-ACELL PERTUSSIS 5-2.5-18.5 LF-MCG/0.5 IM SUSP
0.5000 mL | Freq: Once | INTRAMUSCULAR | Status: AC
  Administered 2016-07-06: 0.5 mL via INTRAMUSCULAR

## 2016-07-06 NOTE — Progress Notes (Signed)
   PRENATAL VISIT NOTE  Subjective:  Monica Munoz is a 26 y.o. G3P2001 at 1865w0d being seen today for ongoing prenatal care.  She is currently monitored for the following issues for this low-risk pregnancy and has Anxiety state, unspecified; Asthma, mild intermittent; Depression; Headache, migraine; Pelvic inflammatory disease; Fitz-Hugh-Curtis syndrome; Tachycardia; Pelvic pain in female; Supervision of normal pregnancy, antepartum; Death of infant; Low-lying placenta in second trimester; and Marijuana abuse on her problem list.  Patient reports fatigue, headache and pelvic discomfort..  Contractions: Irregular. Vag. Bleeding: None.  Movement: Present. Denies leaking of fluid or blood.   Her headache occurs nearly every day with pregnancy and she rates the pain an 8/10. She describes it as throbbing and it lasts for most of the day.  Associated symptoms include visual changes such as right eye blurriness and floating black dots.  She has not tried any medications to relieve her symptoms.  She denies difficulty with speech, numbness or tingling, or muscle weakness.  The following portions of the patient's history were reviewed and updated as appropriate: allergies, current medications, past family history, past medical history, past social history, past surgical history and problem list. Problem list updated.  Objective:   Vitals:   07/06/16 1048  BP: 127/65  Pulse: 64  Weight: 151 lb 14.4 oz (68.9 kg)    Fetal Status: Fetal Heart Rate (bpm): 135   Movement: Present      Fundus height: 27 cm  General:  Alert, oriented and cooperative. Patient is in no acute distress.  Skin: Skin is warm and dry. No rash noted.   Cardiovascular: Normal heart rate noted  Respiratory: Normal respiratory effort, no problems with respiration noted  Abdomen: Soft, gravid, appropriate for gestational age. Pain/Pressure: Absent.  Experiences some abdominal cramping with activity   Pelvic:  Cervical exam deferred         Extremities: Normal range of motion.  Edema: None  Mental Status: Normal mood and affect. Normal behavior. Normal judgment and thought content.    Assessment and Plan:  Pregnancy: G3P2001 at 6165w0d  1. Encounter for supervision of low-risk pregnancy, antepartum  - Glucose Tolerance, 2 Hours w/1 Hour - RPR - HIV antibody (with reflex) - CBC  2. Supervision of other normal pregnancy, antepartum   3.  Suspected migraine with Hormonal Trigger Patient was recommended to follow up with her primary care provider for further evaluation of her headaches and to determine neurology consult is necessary.  She should go to ED if she has any speech difficulty, numbness/tingling, or muscle weakness associated with a headache    Preterm labor symptoms and general obstetric precautions including but not limited to vaginal bleeding, contractions, leaking of fluid and fetal movement were reviewed in detail with the patient. Please refer to After Visit Summary for other counseling recommendations.  No Follow-up on file.   Chrissie NoaMatthew Janoah Menna, Student-PA

## 2016-07-07 LAB — CBC
HEMATOCRIT: 34.1 % (ref 34.0–46.6)
Hemoglobin: 11.8 g/dL (ref 11.1–15.9)
MCH: 33.1 pg — ABNORMAL HIGH (ref 26.6–33.0)
MCHC: 34.6 g/dL (ref 31.5–35.7)
MCV: 96 fL (ref 79–97)
PLATELETS: 264 10*3/uL (ref 150–379)
RBC: 3.56 x10E6/uL — ABNORMAL LOW (ref 3.77–5.28)
RDW: 13 % (ref 12.3–15.4)
WBC: 15.1 10*3/uL — AB (ref 3.4–10.8)

## 2016-07-07 LAB — GLUCOSE TOLERANCE, 2 HOURS W/ 1HR
GLUCOSE, 1 HOUR: 128 mg/dL (ref 65–179)
GLUCOSE, 2 HOUR: 85 mg/dL (ref 65–152)
GLUCOSE, FASTING: 68 mg/dL (ref 65–91)

## 2016-07-07 LAB — SYPHILIS: RPR W/REFLEX TO RPR TITER AND TREPONEMAL ANTIBODIES, TRADITIONAL SCREENING AND DIAGNOSIS ALGORITHM: RPR Ser Ql: NONREACTIVE

## 2016-07-07 LAB — HIV ANTIBODY (ROUTINE TESTING W REFLEX): HIV Screen 4th Generation wRfx: NONREACTIVE

## 2016-07-09 NOTE — Patient Instructions (Signed)
General Headache Without Cause A headache is pain or discomfort felt around the head or neck area. The specific cause of a headache may not be found. There are many causes and types of headaches. A few common ones are:  Tension headaches.  Migraine headaches.  Cluster headaches.  Chronic daily headaches.  Follow these instructions at home: Watch your condition for any changes. Take these steps to help with your condition: Managing pain  Take over-the-counter and prescription medicines only as told by your health care provider.  Lie down in a dark, quiet room when you have a headache.  If directed, apply ice to the head and neck area: ? Put ice in a plastic bag. ? Place a towel between your skin and the bag. ? Leave the ice on for 20 minutes, 2-3 times per day.  Use a heating pad or hot shower to apply heat to the head and neck area as told by your health care provider.  Keep lights dim if bright lights bother you or make your headaches worse. Eating and drinking  Eat meals on a regular schedule.  Limit alcohol use.  Decrease the amount of caffeine you drink, or stop drinking caffeine. General instructions  Keep all follow-up visits as told by your health care provider. This is important.  Keep a headache journal to help find out what may trigger your headaches. For example, write down: ? What you eat and drink. ? How much sleep you get. ? Any change to your diet or medicines.  Try massage or other relaxation techniques.  Limit stress.  Sit up straight, and do not tense your muscles.  Do not use tobacco products, including cigarettes, chewing tobacco, or e-cigarettes. If you need help quitting, ask your health care provider.  Exercise regularly as told by your health care provider.  Sleep on a regular schedule. Get 7-9 hours of sleep, or the amount recommended by your health care provider. Contact a health care provider if:  Your symptoms are not helped by  medicine.  You have a headache that is different from the usual headache.  You have nausea or you vomit.  You have a fever. Get help right away if:  Your headache becomes severe.  You have repeated vomiting.  You have a stiff neck.  You have a loss of vision.  You have problems with speech.  You have pain in the eye or ear.  You have muscular weakness or loss of muscle control.  You lose your balance or have trouble walking.  You feel faint or pass out.  You have confusion. This information is not intended to replace advice given to you by your health care provider. Make sure you discuss any questions you have with your health care provider. Document Released: 01/02/2005 Document Revised: 06/10/2015 Document Reviewed: 04/27/2014 Elsevier Interactive Patient Education  2017 [employer]  

## 2016-07-20 ENCOUNTER — Encounter: Payer: Self-pay | Admitting: Advanced Practice Midwife

## 2016-07-20 ENCOUNTER — Ambulatory Visit (INDEPENDENT_AMBULATORY_CARE_PROVIDER_SITE_OTHER): Payer: Medicaid Other | Admitting: Advanced Practice Midwife

## 2016-07-20 VITALS — BP 123/76 | HR 80 | Wt 156.3 lb

## 2016-07-20 DIAGNOSIS — O26843 Uterine size-date discrepancy, third trimester: Secondary | ICD-10-CM

## 2016-07-20 NOTE — Patient Instructions (Signed)
Third Trimester of Pregnancy The third trimester is from week 28 through week 40 (months 7 through 9). The third trimester is a time when the unborn baby (fetus) is growing rapidly. At the end of the ninth month, the fetus is about 20 inches in length and weighs 6-10 pounds. Body changes during your third trimester Your body will continue to go through many changes during pregnancy. The changes vary from woman to woman. During the third trimester:  Your weight will continue to increase. You can expect to gain 25-35 pounds (11-16 kg) by the end of the pregnancy.  You may begin to get stretch marks on your hips, abdomen, and breasts.  You may urinate more often because the fetus is moving lower into your pelvis and pressing on your bladder.  You may develop or continue to have heartburn. This is caused by increased hormones that slow down muscles in the digestive tract.  You may develop or continue to have constipation because increased hormones slow digestion and cause the muscles that push waste through your intestines to relax.  You may develop hemorrhoids. These are swollen veins (varicose veins) in the rectum that can itch or be painful.  You may develop swollen, bulging veins (varicose veins) in your legs.  You may have increased body aches in the pelvis, back, or thighs. This is due to weight gain and increased hormones that are relaxing your joints.  You may have changes in your hair. These can include thickening of your hair, rapid growth, and changes in texture. Some women also have hair loss during or after pregnancy, or hair that feels dry or thin. Your hair will most likely return to normal after your baby is born.  Your breasts will continue to grow and they will continue to become tender. A yellow fluid (colostrum) may leak from your breasts. This is the first milk you are producing for your baby.  Your belly button may stick out.  You may notice more swelling in your hands,  face, or ankles.  You may have increased tingling or numbness in your hands, arms, and legs. The skin on your belly may also feel numb.  You may feel short of breath because of your expanding uterus.  You may have more problems sleeping. This can be caused by the size of your belly, increased need to urinate, and an increase in your body's metabolism.  You may notice the fetus "dropping," or moving lower in your abdomen (lightening).  You may have increased vaginal discharge.  You may notice your joints feel loose and you may have pain around your pelvic bone.  What to expect at prenatal visits You will have prenatal exams every 2 weeks until week 36. Then you will have weekly prenatal exams. During a routine prenatal visit:  You will be weighed to make sure you and the baby are growing normally.  Your blood pressure will be taken.  Your abdomen will be measured to track your baby's growth.  The fetal heartbeat will be listened to.  Any test results from the previous visit will be discussed.  You may have a cervical check near your due date to see if your cervix has softened or thinned (effaced).  You will be tested for Group B streptococcus. This happens between 35 and 37 weeks.  Your health care provider may ask you:  What your birth plan is.  How you are feeling.  If you are feeling the baby move.  If you have had   any abnormal symptoms, such as leaking fluid, bleeding, severe headaches, or abdominal cramping.  If you are using any tobacco products, including cigarettes, chewing tobacco, and electronic cigarettes.  If you have any questions.  Other tests or screenings that may be performed during your third trimester include:  Blood tests that check for low iron levels (anemia).  Fetal testing to check the health, activity level, and growth of the fetus. Testing is done if you have certain medical conditions or if there are problems during the  pregnancy.  Nonstress test (NST). This test checks the health of your baby to make sure there are no signs of problems, such as the baby not getting enough oxygen. During this test, a belt is placed around your belly. The baby is made to move, and its heart rate is monitored during movement.  What is false labor? False labor is a condition in which you feel small, irregular tightenings of the muscles in the womb (contractions) that usually go away with rest, changing position, or drinking water. These are called Braxton Hicks contractions. Contractions may last for hours, days, or even weeks before true labor sets in. If contractions come at regular intervals, become more frequent, increase in intensity, or become painful, you should see your health care provider. What are the signs of labor?  Abdominal cramps.  Regular contractions that start at 10 minutes apart and become stronger and more frequent with time.  Contractions that start on the top of the uterus and spread down to the lower abdomen and back.  Increased pelvic pressure and dull back pain.  A watery or bloody mucus discharge that comes from the vagina.  Leaking of amniotic fluid. This is also known as your "water breaking." It could be a slow trickle or a gush. Let your health care provider know if it has a color or strange odor. If you have any of these signs, call your health care provider right away, even if it is before your due date. Follow these instructions at home: Medicines  Follow your health care provider's instructions regarding medicine use. Specific medicines may be either safe or unsafe to take during pregnancy.  Take a prenatal vitamin that contains at least 600 micrograms (mcg) of folic acid.  If you develop constipation, try taking a stool softener if your health care provider approves. Eating and drinking  Eat a balanced diet that includes fresh fruits and vegetables, whole grains, good sources of protein  such as meat, eggs, or tofu, and low-fat dairy. Your health care provider will help you determine the amount of weight gain that is right for you.  Avoid raw meat and uncooked cheese. These carry germs that can cause birth defects in the baby.  If you have low calcium intake from food, talk to your health care provider about whether you should take a daily calcium supplement.  Eat four or five small meals rather than three large meals a day.  Limit foods that are high in fat and processed sugars, such as fried and sweet foods.  To prevent constipation: ? Drink enough fluid to keep your urine clear or pale yellow. ? Eat foods that are high in fiber, such as fresh fruits and vegetables, whole grains, and beans. Activity  Exercise only as directed by your health care provider. Most women can continue their usual exercise routine during pregnancy. Try to exercise for 30 minutes at least 5 days a week. Stop exercising if you experience uterine contractions.  Avoid heavy   lifting.  Do not exercise in extreme heat or humidity, or at high altitudes.  Wear low-heel, comfortable shoes.  Practice good posture.  You may continue to have sex unless your health care provider tells you otherwise. Relieving pain and discomfort  Take frequent breaks and rest with your legs elevated if you have leg cramps or low back pain.  Take warm sitz baths to soothe any pain or discomfort caused by hemorrhoids. Use hemorrhoid cream if your health care provider approves.  Wear a good support bra to prevent discomfort from breast tenderness.  If you develop varicose veins: ? Wear support pantyhose or compression stockings as told by your healthcare provider. ? Elevate your feet for 15 minutes, 3-4 times a day. Prenatal care  Write down your questions. Take them to your prenatal visits.  Keep all your prenatal visits as told by your health care provider. This is important. Safety  Wear your seat belt at  all times when driving.  Make a list of emergency phone numbers, including numbers for family, friends, the hospital, and police and fire departments. General instructions  Avoid cat litter boxes and soil used by cats. These carry germs that can cause birth defects in the baby. If you have a cat, ask someone to clean the litter box for you.  Do not travel far distances unless it is absolutely necessary and only with the approval of your health care provider.  Do not use hot tubs, steam rooms, or saunas.  Do not drink alcohol.  Do not use any products that contain nicotine or tobacco, such as cigarettes and e-cigarettes. If you need help quitting, ask your health care provider.  Do not use any medicinal herbs or unprescribed drugs. These chemicals affect the formation and growth of the baby.  Do not douche or use tampons or scented sanitary pads.  Do not cross your legs for long periods of time.  To prepare for the arrival of your baby: ? Take prenatal classes to understand, practice, and ask questions about labor and delivery. ? Make a trial run to the hospital. ? Visit the hospital and tour the maternity area. ? Arrange for maternity or paternity leave through employers. ? Arrange for family and friends to take care of pets while you are in the hospital. ? Purchase a rear-facing car seat and make sure you know how to install it in your car. ? Pack your hospital bag. ? Prepare the baby's nursery. Make sure to remove all pillows and stuffed animals from the baby's crib to prevent suffocation.  Visit your dentist if you have not gone during your pregnancy. Use a soft toothbrush to brush your teeth and be gentle when you floss. Contact a health care provider if:  You are unsure if you are in labor or if your water has broken.  You become dizzy.  You have mild pelvic cramps, pelvic pressure, or nagging pain in your abdominal area.  You have lower back pain.  You have persistent  nausea, vomiting, or diarrhea.  You have an unusual or bad smelling vaginal discharge.  You have pain when you urinate. Get help right away if:  Your water breaks before 37 weeks.  You have regular contractions less than 5 minutes apart before 37 weeks.  You have a fever.  You are leaking fluid from your vagina.  You have spotting or bleeding from your vagina.  You have severe abdominal pain or cramping.  You have rapid weight loss or weight gain.    You have shortness of breath with chest pain.  You notice sudden or extreme swelling of your face, hands, ankles, feet, or legs.  Your baby makes fewer than 10 movements in 2 hours.  You have severe headaches that do not go away when you take medicine.  You have vision changes. Summary  The third trimester is from week 28 through week 40, months 7 through 9. The third trimester is a time when the unborn baby (fetus) is growing rapidly.  During the third trimester, your discomfort may increase as you and your baby continue to gain weight. You may have abdominal, leg, and back pain, sleeping problems, and an increased need to urinate.  During the third trimester your breasts will keep growing and they will continue to become tender. A yellow fluid (colostrum) may leak from your breasts. This is the first milk you are producing for your baby.  False labor is a condition in which you feel small, irregular tightenings of the muscles in the womb (contractions) that eventually go away. These are called Braxton Hicks contractions. Contractions may last for hours, days, or even weeks before true labor sets in.  Signs of labor can include: abdominal cramps; regular contractions that start at 10 minutes apart and become stronger and more frequent with time; watery or bloody mucus discharge that comes from the vagina; increased pelvic pressure and dull back pain; and leaking of amniotic fluid. This information is not intended to replace advice  given to you by your health care provider. Make sure you discuss any questions you have with your health care provider. Document Released: 12/27/2000 Document Revised: 06/10/2015 Document Reviewed: 03/05/2012 Elsevier Interactive Patient Education  2017 [employer]  

## 2016-07-20 NOTE — Progress Notes (Addendum)
   PRENATAL VISIT NOTE  Subjective:  Monica Munoz is a 26 y.o. G3P2001 at 10172w0d being seen today for ongoing prenatal care.  She is currently monitored for the following issues for this low-risk pregnancy and has Anxiety state, unspecified; Asthma, mild intermittent; Depression; Headache, migraine; Pelvic inflammatory disease; Fitz-Hugh-Curtis syndrome; Tachycardia; Pelvic pain in female; Supervision of normal pregnancy, antepartum; Death of infant; Low-lying placenta in second trimester; and Marijuana abuse on her problem list.  Patient reports backache.  Contractions: Not present. Vag. Bleeding: None.  Movement: Present. Denies leaking of fluid.   The following portions of the patient's history were reviewed and updated as appropriate: allergies, current medications, past family history, past medical history, past social history, past surgical history and problem list. Problem list updated.  Objective:   Vitals:   07/20/16 1045  BP: 123/76  Pulse: 80  Weight: 156 lb 4.8 oz (70.9 kg)    Fetal Status: Fetal Heart Rate (bpm): 135   Movement: Present     General:  Alert, oriented and cooperative. Patient is in no acute distress.  Skin: Skin is warm and dry. No rash noted.   Cardiovascular: Normal heart rate noted  Respiratory: Normal respiratory effort, no problems with respiration noted  Abdomen: Soft, gravid, appropriate for gestational age. Pain/Pressure: Present     Pelvic:  Cervical exam deferred       Fundal Height 27 cm (was also 27 cm on 6/21 visit)  Extremities: Normal range of motion.  Edema: None  Mental Status: Normal mood and affect. Normal behavior. Normal judgment and thought content.   Assessment and Plan:  Pregnancy: G3P2001 at 7372w0d  1. Size of fetus inconsistent with dates in third trimester  - US MFM OB FOLLOW UP; Future  Preterm labor symptoms and general obstetric precautions including but not limited to vaginal bleeding, contractions, leaking of fluid and  fetal movement were reviewed in detail with the patient. Please refer to After Visit Summary for other counseling recommendations.  Return in about 2 weeks (around 08/03/2016).   Monica Munoz, CNM  Monica Munoz 11:24 AM

## 2016-07-28 ENCOUNTER — Ambulatory Visit (HOSPITAL_COMMUNITY)
Admission: RE | Admit: 2016-07-28 | Discharge: 2016-07-28 | Disposition: A | Discharge status: Home / Self Care | Payer: Medicaid Other | Source: Ambulatory Visit | Attending: Advanced Practice Midwife | Admitting: Advanced Practice Midwife

## 2016-07-28 ENCOUNTER — Other Ambulatory Visit: Payer: Self-pay | Admitting: Advanced Practice Midwife

## 2016-07-28 DIAGNOSIS — O99513 Diseases of the respiratory system complicating pregnancy, third trimester: Secondary | ICD-10-CM | POA: Insufficient documentation

## 2016-07-28 DIAGNOSIS — J45909 Unspecified asthma, uncomplicated: Secondary | ICD-10-CM | POA: Insufficient documentation

## 2016-07-28 DIAGNOSIS — Z3A30 30 weeks gestation of pregnancy: Secondary | ICD-10-CM

## 2016-07-28 DIAGNOSIS — O283 Abnormal ultrasonic finding on antenatal screening of mother: Secondary | ICD-10-CM | POA: Insufficient documentation

## 2016-07-28 DIAGNOSIS — O26843 Uterine size-date discrepancy, third trimester: Secondary | ICD-10-CM | POA: Diagnosis not present

## 2016-07-28 DIAGNOSIS — O99343 Other mental disorders complicating pregnancy, third trimester: Secondary | ICD-10-CM | POA: Insufficient documentation

## 2016-08-03 ENCOUNTER — Encounter: Payer: Self-pay | Admitting: Obstetrics and Gynecology

## 2016-08-03 ENCOUNTER — Ambulatory Visit (INDEPENDENT_AMBULATORY_CARE_PROVIDER_SITE_OTHER): Payer: Medicaid Other | Admitting: Obstetrics and Gynecology

## 2016-08-03 VITALS — BP 108/65 | HR 89 | Wt 156.8 lb

## 2016-08-03 DIAGNOSIS — J4520 Mild intermittent asthma, uncomplicated: Secondary | ICD-10-CM

## 2016-08-03 DIAGNOSIS — Z3480 Encounter for supervision of other normal pregnancy, unspecified trimester: Secondary | ICD-10-CM

## 2016-08-03 DIAGNOSIS — Z3483 Encounter for supervision of other normal pregnancy, third trimester: Secondary | ICD-10-CM

## 2016-08-03 NOTE — Progress Notes (Signed)
Prenatal Visit Note Date: 08/03/2016 [facility]  Subjective:  Monica Munoz is a 26 y.o. G3P2001 at 6081w0d being seen today for ongoing prenatal care.  She is currently monitored for the following issues for this low-risk pregnancy and has Anxiety state, unspecified; Asthma, mild intermittent; Depression; Headache, migraine; Supervision of normal pregnancy, antepartum; Death of infant; and Marijuana abuse on her problem list.  Patient reports qhs contractions. would like cx check. no s/s currently.   Contractions: Irregular. Vag. Bleeding: None.  Movement: Present. Denies leaking of fluid.   The following portions of the patient's history were reviewed and updated as appropriate: allergies, current medications, past family history, past medical history, past social history, past surgical history and problem list. Problem list updated.  Objective:   Vitals:   08/03/16 1314  BP: 108/65  Pulse: 89  Weight: 156 lb 12.8 oz (71.1 kg)    Fetal Status: Fetal Heart Rate (bpm): 135 Fundal Height: 31 cm Movement: Present     General:  Alert, oriented and cooperative. Patient is in no acute distress.  Skin: Skin is warm and dry. No rash noted.   Cardiovascular: Normal heart rate noted  Respiratory: Normal respiratory effort, no problems with respiration noted  Abdomen: Soft, gravid, appropriate for gestational age. Pain/Pressure: Present     Pelvic:  Cervical exam performed Dilation: Closed Effacement (%): Thick Station: Ballotable  Extremities: Normal range of motion.     Mental Status: Normal mood and affect. Normal behavior. Normal judgment and thought content.   Urinalysis:      Assessment and Plan:  Pregnancy: G3P2001 at 8681w0d  1Supervision of other normal pregnancy, antepartum Routine care.  Preterm labor symptoms and general obstetric precautions including but not limited to vaginal bleeding, contractions, leaking of fluid and fetal movement were reviewed  in detail with the patient. Please refer to After Visit Summary for other counseling recommendations.  Return in about 2 weeks (around 08/17/2016) for rob.   [place] BingPickens, Blanch Stang, MD

## 2016-08-10 ENCOUNTER — Encounter: Payer: Medicaid Other | Admitting: Student

## 2016-08-15 ENCOUNTER — Inpatient Hospital Stay (HOSPITAL_COMMUNITY)
Admission: AD | Admit: 2016-08-15 | Discharge: 2016-08-15 | Disposition: A | Discharge status: Home / Self Care | Payer: Medicaid Other | Source: Ambulatory Visit | Attending: Obstetrics & Gynecology | Admitting: Obstetrics & Gynecology

## 2016-08-15 ENCOUNTER — Telehealth (HOSPITAL_COMMUNITY): Payer: Self-pay

## 2016-08-15 ENCOUNTER — Encounter (HOSPITAL_COMMUNITY): Payer: Self-pay

## 2016-08-15 DIAGNOSIS — Z3A32 32 weeks gestation of pregnancy: Secondary | ICD-10-CM | POA: Insufficient documentation

## 2016-08-15 DIAGNOSIS — Z3480 Encounter for supervision of other normal pregnancy, unspecified trimester: Secondary | ICD-10-CM

## 2016-08-15 DIAGNOSIS — R102 Pelvic and perineal pain: Secondary | ICD-10-CM

## 2016-08-15 DIAGNOSIS — O26893 Other specified pregnancy related conditions, third trimester: Secondary | ICD-10-CM

## 2016-08-15 DIAGNOSIS — R1030 Lower abdominal pain, unspecified: Secondary | ICD-10-CM | POA: Diagnosis present

## 2016-08-15 DIAGNOSIS — R109 Unspecified abdominal pain: Secondary | ICD-10-CM | POA: Diagnosis not present

## 2016-08-15 DIAGNOSIS — O26899 Other specified pregnancy related conditions, unspecified trimester: Secondary | ICD-10-CM

## 2016-08-15 DIAGNOSIS — R82998 Other abnormal findings in urine: Secondary | ICD-10-CM

## 2016-08-15 LAB — URINALYSIS, ROUTINE W REFLEX MICROSCOPIC
Bilirubin Urine: NEGATIVE
GLUCOSE, UA: NEGATIVE mg/dL
Ketones, ur: NEGATIVE mg/dL
NITRITE: NEGATIVE
PH: 7 (ref 5.0–8.0)
Protein, ur: 30 mg/dL — AB
SPECIFIC GRAVITY, URINE: 1.008 (ref 1.005–1.030)

## 2016-08-15 MED ORDER — PHENAZOPYRIDINE HCL 200 MG PO TABS: 200.0000 mg | ORAL_TABLET | Freq: Three times a day (TID) | ORAL | 1 refills | Status: DC | PRN

## 2016-08-15 MED ORDER — OXYCODONE-ACETAMINOPHEN 5-325 MG PO TABS: 1.0000 | ORAL_TABLET | Freq: Four times a day (QID) | ORAL | 0 refills | Status: DC | PRN

## 2016-08-15 MED ORDER — CEPHALEXIN 500 MG PO CAPS: 500.0000 mg | ORAL_CAPSULE | Freq: Four times a day (QID) | ORAL | 0 refills | Status: DC

## 2016-08-15 NOTE — MAU Note (Signed)
Pt c/o lower abdominal cramping since 11:00p. States she has tried hot bath, rest, walking, etc. And nothing is helping. Pt denies vaginal bleeding or LOF. Reports good fetal movement. Denies urinary s/s.

## 2016-08-15 NOTE — Discharge Instructions (Signed)

## 2016-08-15 NOTE — MAU Provider Note (Signed)
Chief Complaint:  Contractions   First Provider Initiated Contact with Patient 08/15/16 0157     HPI: Monica Munoz is a 26 y.o. G3P2001 at 5332w5dwho presents to maternity admissions reporting lower abdominal cramping.  Hurts mostly over suprapubic area.  . She reports good fetal movement, denies LOF, vaginal bleeding, vaginal itching/burning, urinary symptoms, h/a, dizziness, n/v, diarrhea, constipation or fever/chills.  .  Abdominal Pain  This is a new problem. The current episode started in the past 7 days. The onset quality is gradual. The problem occurs intermittently. The problem has been unchanged. The pain is located in the suprapubic region. The quality of the pain is cramping. The abdominal pain does not radiate. Pertinent negatives include no constipation, diarrhea, fever, headaches, myalgias, nausea or vomiting. Nothing aggravates the pain. The pain is relieved by nothing. She has tried nothing for the symptoms.    RN Note: Pt c/o lower abdominal cramping since 11:00p. States she has tried hot bath, rest, walking, etc. And nothing is helping. Pt denies vaginal bleeding or LOF. Reports good fetal movement. Denies urinary s/s.   Past Medical History: Past Medical History:  Diagnosis Date  . Anxiety   . Asthma   . Depression   . 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 Migraine with aura 04/25/2012  . Pelvic inflammatory disease 04/25/2015    Past obstetric history: OB History  Gravida Para Term Preterm AB Living  3 2 2  0 0 1  SAB TAB Ectopic Multiple Live Births  0 0 0 0 2    # Outcome Date GA Lbr Len/2nd Weight Sex Delivery Anes PTL Lv  3 Current           2 Term 10/13/14 2251w5d 10:42 / 00:15 8 lb 3 oz (3.714 kg) F Vag-Spont EPI  DEC  1 Term 05/27/13 3281w0d 02:30 / 02:19 8 lb 10.3 oz (3.92 kg) M Vag-Spont EPI  LIV    Obstetric Comments  Deceased at 11 months from cyst in throat.     Past Surgical History: Past Surgical History:  Procedure Laterality Date  . WISDOM TOOTH  EXTRACTION      Family History: Family History  Problem Relation Age of Onset  . Diabetes Maternal Grandfather   . Hyperlipidemia Maternal Grandfather   . Stroke Maternal Grandfather   . Heart murmur Mother   . Miscarriages / [place] Mother   . Diabetes Mother   . Autism Paternal Uncle   . Down syndrome Paternal Uncle   . Stroke Maternal Grandmother     Social History: Social History  Substance Use Topics  . Smoking status: Never Smoker  . Smokeless tobacco: Never Used  . Alcohol use No    Allergies:  Allergies  Allergen Reactions  . Benadryl [Diphenhydramine Hcl] Other (See Comments)    Reaction:  Red Man Syndrome   . Reglan [Metoclopramide] Shortness Of Breath    Chest pain, face numb and red  . Lorazepam Other (See Comments)    Pt states that this med makes her feel "loopy".   . Lorazepam Other (See Comments)  . Vancomycin Itching  . Adhesive [Tape] Itching and Dermatitis    Meds:  Prescriptions Prior to Admission  Medication Sig Dispense Refill Last Dose  . ondansetron (ZOFRAN-ODT) 4 MG disintegrating tablet Take 1 tablet (4 mg total) by mouth every 8 (eight) hours as needed for nausea or vomiting. 20 tablet 3 Past Week at Unknown time  . Prenatal Vit-Fe Fumarate-FA (PRENATAL MULTIVITAMIN) TABS tablet Take 1 tablet by mouth  at bedtime.    08/14/2016 at Unknown time    I have reviewed patient's Past Medical Hx, Surgical Hx, Family Hx, Social Hx, medications and allergies.   ROS:  Review of Systems  Constitutional: Negative for fever.  Gastrointestinal: Positive for abdominal pain. Negative for constipation, diarrhea, nausea and vomiting.  Musculoskeletal: Negative for myalgias.  Neurological: Negative for headaches.   Other systems negative  Physical Exam  Patient Vitals for the past 24 hrs:  BP Temp Temp src Pulse Resp SpO2 Height Weight  08/15/16 0136 118/72 98.2 F (36.8 C) Oral 89 16 98 % 5\' 5"  (1.651 m) 160 lb (72.6 kg)   Constitutional:  Well-developed, well-nourished female in no acute distress.  Cardiovascular: normal rate and rhythm Respiratory: normal effort, clear to auscultation bilaterally GI: Abd soft, non-tender, gravid appropriate for gestational age.   No rebound or guarding. MS: Extremities nontender, no edema, normal ROM Neurologic: Alert and oriented x 4.  GU: Neg CVAT.  PELVIC EXAM: Dilation: Closed Effacement (%): Thick Cervical Position: Posterior Station: Ballotable Exam by:: Artelia LarocheM. Orton Capell CNM  FHT:  Baseline 135 , moderate variability, accelerations present, no decelerations Contractions:   Rare   Labs: A/Positive/-- (03/01 1115) Results for orders placed or performed during the hospital encounter of 08/15/16 (from the past 24 hour(s))  Urinalysis, Routine w reflex microscopic     Status: Abnormal   Collection Time: 08/15/16  1:33 AM  Result Value Ref Range   Color, Urine YELLOW YELLOW   APPearance HAZY (A) CLEAR   Specific Gravity, Urine 1.008 1.005 - 1.030   pH 7.0 5.0 - 8.0   Glucose, UA NEGATIVE NEGATIVE mg/dL   Hgb urine dipstick LARGE (A) NEGATIVE   Bilirubin Urine NEGATIVE NEGATIVE   Ketones, ur NEGATIVE NEGATIVE mg/dL   Protein, ur 30 (A) NEGATIVE mg/dL   Nitrite NEGATIVE NEGATIVE   Leukocytes, UA LARGE (A) NEGATIVE   RBC / HPF TOO NUMEROUS TO COUNT 0 - 5 RBC/hpf   WBC, UA TOO NUMEROUS TO COUNT 0 - 5 WBC/hpf   Bacteria, UA RARE (A) NONE SEEN   Squamous Epithelial / LPF 0-5 (A) NONE SEEN   Mucous PRESENT     Imaging:    MAU Course/MDM: I have ordered labs and reviewed results. Urine appears to be indicative of UTI.  Suspect this may be responsible for her suprapubic cramping.  NST reviewed  No labor pattern     Assessment: 1. Supervision of other normal pregnancy, antepartum   2.    Suprapubic pain/cramping 3.    Probable UTI  Plan: Discharge home Rx Keflex for probable UTI Rx Pyridium for pain Rx #5 Percocet for pain until meds work Preterm Labor precautions and  fetal kick counts Follow up in Office for prenatal visits and recheck of progress  Encouraged to return here or to other Urgent Care/ED if she develops worsening of symptoms, increase in pain, fever, or other concerning symptoms.   Pt stable at time of discharge.  Wynelle BourgeoisMarie Joshva Labreck CNM, MSN Certified Nurse-Midwife 08/15/2016 1:57 AM

## 2016-08-15 NOTE — Telephone Encounter (Signed)
Pt called asking about her rx that was sext to her pharmacy from her visit this morning. Pt stated she was prescribed Keflex, percocet, and another medicine. Looking through Monica Munoz, CNM note, it states she was prescribed pyridium, keflex, and percocet. Pt was only able to pick up percocet from pharmacy.

## 2016-08-16 LAB — CULTURE, OB URINE

## 2016-08-21 ENCOUNTER — Ambulatory Visit (INDEPENDENT_AMBULATORY_CARE_PROVIDER_SITE_OTHER): Payer: Medicaid Other | Admitting: Student

## 2016-08-21 VITALS — BP 108/73 | HR 88 | Wt 162.0 lb

## 2016-08-21 DIAGNOSIS — Z3483 Encounter for supervision of other normal pregnancy, third trimester: Secondary | ICD-10-CM

## 2016-08-21 DIAGNOSIS — Z3480 Encounter for supervision of other normal pregnancy, unspecified trimester: Secondary | ICD-10-CM

## 2016-08-21 DIAGNOSIS — Z3403 Encounter for supervision of normal first pregnancy, third trimester: Secondary | ICD-10-CM

## 2016-08-21 NOTE — Patient Instructions (Signed)

## 2016-08-22 DIAGNOSIS — Z3403 Encounter for supervision of normal first pregnancy, third trimester: Secondary | ICD-10-CM | POA: Insufficient documentation

## 2016-08-22 NOTE — Progress Notes (Signed)
   PRENATAL VISIT NOTE  Subjective:  Achilles Dunkori Altergott is a 26 y.o. G3P2001 at 421w5d being seen today for ongoing prenatal care.  She is currently monitored for the following issues for this low-risk pregnancy and has Anxiety state, unspecified; Asthma, mild intermittent; Depression; Headache, migraine; Supervision of normal pregnancy, antepartum; Death of infant; Marijuana abuse; and Supervision of low-risk first pregnancy, third trimester on her problem list.  Patient reports no complaints.  Contractions: Not present. Vag. Bleeding: None.  Movement: Present. Denies leaking of fluid.   The following portions of the patient's history were reviewed and updated as appropriate: allergies, current medications, past family history, past medical history, past social history, past surgical history and problem list. Problem list updated.  Objective:   Vitals:   08/21/16 1445  BP: 108/73  Pulse: 88  Weight: 162 lb (73.5 kg)    Fetal Status: Fetal Heart Rate (bpm): 131 Fundal Height: 33 cm Movement: Present     General:  Alert, oriented and cooperative. Patient is in no acute distress.  Skin: Skin is warm and dry. No rash noted.   Cardiovascular: Normal heart rate noted  Respiratory: Normal respiratory effort, no problems with respiration noted  Abdomen: Soft, gravid, appropriate for gestational age.  Pain/Pressure: Present     Pelvic: Cervical exam deferred        Extremities: Normal range of motion.  Edema: Trace  Mental Status:  Normal mood and affect. Normal behavior. Normal judgment and thought content.   Assessment and Plan:  Pregnancy: G3P2001 at 4921w5d  1. Supervision of low-risk first pregnancy, third trimester Patient doing well but very, very anxious about delivering on her daughter's birthday (September 27) or on Sept 16 ( 1 year anniversary of her infant daughter's death). She is requesting membrane stripping at 37 weeks. I empathised with her and said that we can talk about it closer  to the time, but that it seemed reasonable to try to avoid delivering around these two important dates.     Preterm labor symptoms and general obstetric precautions including but not limited to vaginal bleeding, contractions, leaking of fluid and fetal movement were reviewed in detail with the patient. Please refer to After Visit Summary for other counseling recommendations.  Return in about 2 weeks (around 09/04/2016).   Marylene LandKathryn Lorraine Chantee Cerino, CNM

## 2016-09-06 ENCOUNTER — Other Ambulatory Visit (HOSPITAL_COMMUNITY)
Admission: RE | Admit: 2016-09-06 | Discharge: 2016-09-06 | Disposition: A | Discharge status: Home / Self Care | Payer: Medicaid Other | Source: Ambulatory Visit | Attending: Medical | Admitting: Medical

## 2016-09-06 ENCOUNTER — Ambulatory Visit (INDEPENDENT_AMBULATORY_CARE_PROVIDER_SITE_OTHER): Payer: Medicaid Other | Admitting: Medical

## 2016-09-06 VITALS — BP 123/76 | HR 84 | Wt 165.7 lb

## 2016-09-06 DIAGNOSIS — Z3400 Encounter for supervision of normal first pregnancy, unspecified trimester: Secondary | ICD-10-CM

## 2016-09-06 DIAGNOSIS — Z3A00 Weeks of gestation of pregnancy not specified: Secondary | ICD-10-CM | POA: Diagnosis not present

## 2016-09-06 DIAGNOSIS — Z3483 Encounter for supervision of other normal pregnancy, third trimester: Secondary | ICD-10-CM

## 2016-09-06 LAB — OB RESULTS CONSOLE GBS: GBS: NEGATIVE

## 2016-09-06 LAB — OB RESULTS CONSOLE GC/CHLAMYDIA: Gonorrhea: NEGATIVE

## 2016-09-06 NOTE — Patient Instructions (Signed)
Fetal Movement Counts °Patient Name: ________________________________________________ Patient Due Date: ____________________ °What is a fetal movement count? °A fetal movement count is the number of times that you feel your baby move during a certain amount of time. This may also be called a fetal kick count. A fetal movement count is recommended for every pregnant woman. You may be asked to start counting fetal movements as early as week 28 of your pregnancy. °Pay attention to when your baby is most active. You may notice your baby's sleep and wake cycles. You may also notice things that make your baby move more. You should do a fetal movement count: °· When your baby is normally most active. °· At the same time each day. ° °A good time to count movements is while you are resting, after having something to eat and drink. °How do I count fetal movements? °1. Find a quiet, comfortable area. Sit, or lie down on your side. °2. Write down the date, the start time and stop time, and the number of movements that you felt between those two times. Take this information with you to your health care visits. °3. For 2 hours, count kicks, flutters, swishes, rolls, and jabs. You should feel at least 10 movements during 2 hours. °4. You may stop counting after you have felt 10 movements. °5. If you do not feel 10 movements in 2 hours, have something to eat and drink. Then, keep resting and counting for 1 hour. If you feel at least 4 movements during that hour, you may stop counting. °Contact a health care provider if: °· You feel fewer than 4 movements in 2 hours. °· Your baby is not moving like he or she usually does. °Date: ____________ Start time: ____________ Stop time: ____________ Movements: ____________ °Date: ____________ Start time: ____________ Stop time: ____________ Movements: ____________ °Date: ____________ Start time: ____________ Stop time: ____________ Movements: ____________ °Date: ____________ Start time:  ____________ Stop time: ____________ Movements: ____________ °Date: ____________ Start time: ____________ Stop time: ____________ Movements: ____________ °Date: ____________ Start time: ____________ Stop time: ____________ Movements: ____________ °Date: ____________ Start time: ____________ Stop time: ____________ Movements: ____________ °Date: ____________ Start time: ____________ Stop time: ____________ Movements: ____________ °Date: ____________ Start time: ____________ Stop time: ____________ Movements: ____________ °This information is not intended to replace advice given to you by your health care provider. Make sure you discuss any questions you have with your health care provider. °Document Released: 02/01/2006 Document Revised: 09/01/2015 Document Reviewed: 02/11/2015 °Elsevier Interactive Patient Education © 2018 [employer] °Braxton Hicks Contractions °Contractions of the uterus can occur throughout pregnancy, but they are not always a sign that you are in labor. You may have practice contractions called Braxton Hicks contractions. These false labor contractions are sometimes confused with true labor. °What are Braxton Hicks contractions? °Braxton Hicks contractions are tightening movements that occur in the muscles of the uterus before labor. Unlike true labor contractions, these contractions do not result in opening (dilation) and thinning of the cervix. Toward the end of pregnancy (32-34 weeks), Braxton Hicks contractions can happen more often and may become stronger. These contractions are sometimes difficult to tell apart from true labor because they can be very uncomfortable. You should not feel embarrassed if you go to the hospital with false labor. °Sometimes, the only way to tell if you are in true labor is for your health care provider to look for changes in the cervix. The health care provider will do a physical exam and may monitor your contractions. If   you are not in true labor, the exam  should show that your cervix is not dilating and your water has not broken. °If there are no prenatal problems or other health problems associated with your pregnancy, it is completely safe for you to be sent home with false labor. You may continue to have Braxton Hicks contractions until you go into true labor. °How can I tell the difference between true labor and false labor? °· Differences °? False labor °? Contractions last 30-70 seconds.: Contractions are usually shorter and not as strong as true labor contractions. °? Contractions become very regular.: Contractions are usually irregular. °? Discomfort is usually felt in the top of the uterus, and it spreads to the lower abdomen and low back.: Contractions are often felt in the front of the lower abdomen and in the groin. °? Contractions do not go away with walking.: Contractions may go away when you walk around or change positions while lying down. °? Contractions usually become more intense and increase in frequency.: Contractions get weaker and are shorter-lasting as time goes on. °? The cervix dilates and gets thinner.: The cervix usually does not dilate or become thin. °Follow these instructions at home: °· Take over-the-counter and prescription medicines only as told by your health care provider. °· Keep up with your usual exercises and follow other instructions from your health care provider. °· Eat and drink lightly if you think you are going into labor. °· If Braxton Hicks contractions are making you uncomfortable: °? Change your position from lying down or resting to walking, or change from walking to resting. °? Sit and rest in a tub of warm water. °? Drink enough fluid to keep your urine clear or pale yellow. Dehydration may cause these contractions. °? Do slow and deep breathing several times an hour. °· Keep all follow-up prenatal visits as told by your health care provider. This is important. °Contact a health care provider if: °· You have a  fever. °· You have continuous pain in your abdomen. °Get help right away if: °· Your contractions become stronger, more regular, and closer together. °· You have fluid leaking or gushing from your vagina. °· You pass blood-tinged mucus (bloody show). °· You have bleeding from your vagina. °· You have low back pain that you never had before. °· You feel your baby’s head pushing down and causing pelvic pressure. °· Your baby is not moving inside you as much as it used to. °Summary °· Contractions that occur before labor are called Braxton Hicks contractions, false labor, or practice contractions. °· Braxton Hicks contractions are usually shorter, weaker, farther apart, and less regular than true labor contractions. True labor contractions usually become progressively stronger and regular and they become more frequent. °· Manage discomfort from Braxton Hicks contractions by changing position, resting in a warm bath, drinking plenty of water, or practicing deep breathing. °This information is not intended to replace advice given to you by your health care provider. Make sure you discuss any questions you have with your health care provider. °Document Released: 01/02/2005 Document Revised: 11/22/2015 Document Reviewed: 11/22/2015 °Elsevier Interactive Patient Education © 2017 [employer] ° °

## 2016-09-06 NOTE — Progress Notes (Signed)
Pt reports some cramping and back pain.

## 2016-09-07 LAB — CERVICOVAGINAL ANCILLARY ONLY
CHLAMYDIA, DNA PROBE: NEGATIVE
NEISSERIA GONORRHEA: NEGATIVE

## 2016-09-10 LAB — CULTURE, BETA STREP (GROUP B ONLY): Strep Gp B Culture: NEGATIVE

## 2016-09-13 ENCOUNTER — Ambulatory Visit (INDEPENDENT_AMBULATORY_CARE_PROVIDER_SITE_OTHER): Payer: Medicaid Other | Admitting: Advanced Practice Midwife

## 2016-09-13 VITALS — BP 120/69 | HR 72 | Wt 168.5 lb

## 2016-09-13 DIAGNOSIS — Z3483 Encounter for supervision of other normal pregnancy, third trimester: Secondary | ICD-10-CM

## 2016-09-13 DIAGNOSIS — Z3480 Encounter for supervision of other normal pregnancy, unspecified trimester: Secondary | ICD-10-CM

## 2016-09-13 NOTE — Patient Instructions (Signed)

## 2016-09-13 NOTE — Progress Notes (Signed)
   PRENATAL VISIT NOTE  Subjective:  Monica Munoz is a 26 y.o. G3P2001 at 4478w6d being seen today for ongoing prenatal care.  She is currently monitored for the following issues for this low-risk pregnancy and has Anxiety state, unspecified; Asthma, mild intermittent; Depression; Headache, migraine; Supervision of normal pregnancy, antepartum; Death of infant; and Marijuana abuse on her problem list.  Patient reports backache, nausea, no bleeding, occasional irregular contractions, no cramping, no leaking and round liagment pain.  Contractions: Irritability.  .  Movement: Present. Denies leaking of fluid.   The following portions of the patient's history were reviewed and updated as appropriate: allergies, current medications, past family history, past medical history, past social history, past surgical history and problem list. Problem list updated.  Objective:   Vitals:   09/13/16 0938  BP: 120/69  Pulse: 72  Weight: 168 lb 8 oz (76.4 kg)    Fetal Status: Fetal Heart Rate (bpm): 140   Movement: Present   Fundal Height: 37cm  General:  Alert, oriented and cooperative. Patient is in no acute distress.  Skin: Skin is warm and dry. No rash noted.   Cardiovascular: Normal heart rate noted  Respiratory: Normal respiratory effort, no problems with respiration noted  Abdomen: Soft, gravid, appropriate for gestational age. Pain/Pressure: Present     Pelvic:  Cervical exam performed: 1.5/ 50        Extremities: Normal range of motion.  Edema: Trace  Mental Status: Normal mood and affect. Normal behavior. Normal judgment and thought content.   Assessment and Plan:  Pregnancy: G3P2001 at 1578w6d  1. Supervision of low-risk pregnancy, third trimester Doing well, patient desires stripping of membranes because she does not want to deliver on daughter's birthday (sep [id number]) or on Sept 16 (anniverssary of infant daughters death).  - Strip membranes at next appt - Routine care  There are no diagnoses  linked to this encounter. Term labor symptoms and general obstetric precautions including but not limited to vaginal bleeding, contractions, leaking of fluid and fetal movement were reviewed in detail with the patient. Please refer to After Visit Summary for other counseling recommendations.  No Follow-up on file.

## 2016-09-18 ENCOUNTER — Encounter (HOSPITAL_COMMUNITY): Payer: Self-pay

## 2016-09-18 ENCOUNTER — Inpatient Hospital Stay (HOSPITAL_COMMUNITY)
Admission: AD | Admit: 2016-09-18 | Discharge: 2016-09-18 | Disposition: A | Discharge status: Home / Self Care | Payer: Medicaid Other | Source: Ambulatory Visit | Attending: Obstetrics & Gynecology | Admitting: Obstetrics & Gynecology

## 2016-09-18 DIAGNOSIS — O99283 Endocrine, nutritional and metabolic diseases complicating pregnancy, third trimester: Secondary | ICD-10-CM | POA: Insufficient documentation

## 2016-09-18 DIAGNOSIS — E860 Dehydration: Secondary | ICD-10-CM | POA: Diagnosis not present

## 2016-09-18 DIAGNOSIS — G44209 Tension-type headache, unspecified, not intractable: Secondary | ICD-10-CM

## 2016-09-18 DIAGNOSIS — Z3A37 37 weeks gestation of pregnancy: Secondary | ICD-10-CM | POA: Diagnosis not present

## 2016-09-18 DIAGNOSIS — R0981 Nasal congestion: Secondary | ICD-10-CM | POA: Diagnosis present

## 2016-09-18 DIAGNOSIS — J069 Acute upper respiratory infection, unspecified: Secondary | ICD-10-CM | POA: Insufficient documentation

## 2016-09-18 DIAGNOSIS — R51 Headache: Secondary | ICD-10-CM | POA: Diagnosis present

## 2016-09-18 DIAGNOSIS — O9989 Other specified diseases and conditions complicating pregnancy, childbirth and the puerperium: Secondary | ICD-10-CM | POA: Diagnosis not present

## 2016-09-18 DIAGNOSIS — O99513 Diseases of the respiratory system complicating pregnancy, third trimester: Secondary | ICD-10-CM | POA: Insufficient documentation

## 2016-09-18 DIAGNOSIS — O26893 Other specified pregnancy related conditions, third trimester: Secondary | ICD-10-CM | POA: Diagnosis present

## 2016-09-18 LAB — URINALYSIS, ROUTINE W REFLEX MICROSCOPIC
Bilirubin Urine: NEGATIVE
GLUCOSE, UA: NEGATIVE mg/dL
Hgb urine dipstick: NEGATIVE
Ketones, ur: 80 mg/dL — AB
Nitrite: NEGATIVE
PROTEIN: NEGATIVE mg/dL
Specific Gravity, Urine: 1.014 (ref 1.005–1.030)
pH: 7 (ref 5.0–8.0)

## 2016-09-18 MED ORDER — ACETAMINOPHEN 500 MG PO TABS
1000.0000 mg | ORAL_TABLET | Freq: Once | ORAL | Status: AC
  Administered 2016-09-18: 1000 mg via ORAL
  Filled 2016-09-18: qty 2

## 2016-09-18 MED ORDER — ONDANSETRON HCL 4 MG PO TABS: 4.0000 mg | ORAL_TABLET | Freq: Four times a day (QID) | ORAL | 0 refills | Status: DC

## 2016-09-18 MED ORDER — GUAIFENESIN-DM 100-10 MG/5ML PO SYRP: 5.0000 mL | ORAL_SOLUTION | ORAL | 0 refills | Status: DC | PRN

## 2016-09-18 MED ORDER — ONDANSETRON 4 MG PO TBDP
4.0000 mg | ORAL_TABLET | Freq: Once | ORAL | Status: AC
  Administered 2016-09-18: 4 mg via ORAL
  Filled 2016-09-18: qty 1

## 2016-09-18 MED ORDER — GUAIFENESIN-DM 100-10 MG/5ML PO SYRP
5.0000 mL | ORAL_SOLUTION | ORAL | Status: DC | PRN
  Administered 2016-09-18: 5 mL via ORAL
  Filled 2016-09-18 (×2): qty 5

## 2016-09-18 MED ORDER — PSEUDOEPHEDRINE HCL 30 MG PO TABS: 30.0000 mg | ORAL_TABLET | ORAL | 0 refills | Status: DC | PRN

## 2016-09-18 MED ORDER — LACTATED RINGERS IV SOLN
25.0000 mg | Freq: Once | INTRAVENOUS | Status: AC
  Administered 2016-09-18: 25 mg via INTRAVENOUS
  Filled 2016-09-18: qty 1

## 2016-09-18 MED ORDER — LACTATED RINGERS IV BOLUS (SEPSIS): 1000.0000 mL | Freq: Once | INTRAVENOUS | Status: DC

## 2016-09-18 NOTE — MAU Note (Signed)
Reports congestion, runny nose, and headache since yesterday. Some facial pressure. Did not take temp at home but her mom felt her head and "it felt warm". +fetal movement, some cramping, no bledding, no lof.

## 2016-09-18 NOTE — Discharge Instructions (Signed)
Cool Mist Vaporizer A cool mist vaporizer is a device that releases a cool mist into the air. If you have a cough or a cold, using a vaporizer may help relieve your symptoms. The mist adds moisture to the air, which may help thin your mucus and make it less sticky. When your mucus is thin and less sticky, it easier for you to breathe and to cough up secretions. Do not use a vaporizer if you are allergic to mold. Follow these instructions at home:  Follow the instructions that come with the vaporizer.  Do not use anything other than distilled water in the vaporizer.  Do not run the vaporizer all of the time. Doing that can cause mold or bacteria to grow in the vaporizer.  Clean the vaporizer after each time that you use it.  Clean and dry the vaporizer well before storing it.  Stop using the vaporizer if your breathing symptoms get worse. This information is not intended to replace advice given to you by your health care provider. Make sure you discuss any questions you have with your health care provider. Document Released: 09/30/2003 Document Revised: 07/23/2015 Document Reviewed: 04/03/2015 Elsevier Interactive Patient Education  2018 [employer]. Dehydration, Adult Dehydration is a condition in which there is not enough fluid or water in the body. This happens when you lose more fluids than you take in. Important organs, such as the kidneys, brain, and heart, cannot function without a proper amount of fluids. Any loss of fluids from the body can lead to dehydration. Dehydration can range from mild to severe. This condition should be treated right away to prevent it from becoming severe. What are the causes? This condition may be caused by:  Vomiting.  Diarrhea.  Excessive sweating, such as from heat exposure or exercise.  Not drinking enough fluid, especially: ? When ill. ? While doing activity that requires a lot of energy.  Excessive  urination.  Fever.  Infection.  Certain medicines, such as medicines that cause the body to lose excess fluid (diuretics).  Inability to access safe drinking water.  Reduced physical ability to get adequate water and food.  What increases the risk? This condition is more likely to develop in people:  Who have a poorly controlled long-term (chronic) illness, such as diabetes, heart disease, or kidney disease.  Who are age 52 or older.  Who are disabled.  Who live in a place with high altitude.  Who play endurance sports.  What are the signs or symptoms? Symptoms of mild dehydration may include:  Thirst.  Dry lips.  Slightly dry mouth.  Dry, warm skin.  Dizziness. Symptoms of moderate dehydration may include:  Very dry mouth.  Muscle cramps.  Dark urine. Urine may be the color of tea.  Decreased urine production.  Decreased tear production.  Heartbeat that is irregular or faster than normal (palpitations).  Headache.  Light-headedness, especially when you stand up from a sitting position.  Fainting (syncope). Symptoms of severe dehydration may include:  Changes in skin, such as: ? Cold and clammy skin. ? Blotchy (mottled) or pale skin. ? Skin that does not quickly return to normal after being lightly pinched and released (poor skin turgor).  Changes in body fluids, such as: ? Extreme thirst. ? No tear production. ? Inability to sweat when body temperature is high, such as in hot weather. ? Very little urine production.  Changes in vital signs, such as: ? Weak pulse. ? Pulse that is more than 100  beats a minute when sitting still. ? Rapid breathing. ? Low blood pressure.  Other changes, such as: ? Sunken eyes. ? Cold hands and feet. ? Confusion. ? Lack of energy (lethargy). ? Difficulty waking up from sleep. ? Short-term weight loss. ? Unconsciousness. How is this diagnosed? This condition is diagnosed based on your symptoms and a  physical exam. Blood and urine tests may be done to help confirm the diagnosis. How is this treated? Treatment for this condition depends on the severity. Mild or moderate dehydration can often be treated at home. Treatment should be started right away. Do not wait until dehydration becomes severe. Severe dehydration is an emergency and it needs to be treated in a hospital. Treatment for mild dehydration may include:  Drinking more fluids.  Replacing salts and minerals in your blood (electrolytes) that you may have lost. Treatment for moderate dehydration may include:  Drinking an oral rehydration solution (ORS). This is a drink that helps you replace fluids and electrolytes (rehydrate). It can be found at pharmacies and retail stores. Treatment for severe dehydration may include:  Receiving fluids through an IV tube.  Receiving an electrolyte solution through a feeding tube that is passed through your nose and into your stomach (nasogastric tube, or NG tube).  Correcting any abnormalities in electrolytes.  Treating the underlying cause of dehydration. Follow these instructions at home:  If directed by your health care provider, drink an ORS: ? Make an ORS by following instructions on the package. ? Start by drinking small amounts, about  cup (120 mL) every 5-10 minutes. ? Slowly increase how much you drink until you have taken the amount recommended by your health care provider.  Drink enough clear fluid to keep your urine clear or pale yellow. If you were told to drink an ORS, finish the ORS first, then start slowly drinking other clear fluids. Drink fluids such as: ? Water. Do not drink only water. Doing that can lead to having too little salt (sodium) in the body (hyponatremia). ? Ice chips. ? Fruit juice that you have added water to (diluted fruit juice). ? Low-calorie sports drinks.  Avoid: ? Alcohol. ? Drinks that contain a lot of sugar. These include high-calorie sports  drinks, fruit juice that is not diluted, and soda. ? Caffeine. ? Foods that are greasy or contain a lot of fat or sugar.  Take over-the-counter and prescription medicines only as told by your health care provider.  Do not take sodium tablets. This can lead to having too much sodium in the body (hypernatremia).  Eat foods that contain a healthy balance of electrolytes, such as bananas, oranges, potatoes, tomatoes, and spinach.  Keep all follow-up visits as told by your health care provider. This is important. Contact a health care provider if:  You have abdominal pain that: ? Gets worse. ? Stays in one area (localizes).  You have a rash.  You have a stiff neck.  You are more irritable than usual.  You are sleepier or more difficult to wake up than usual.  You feel weak or dizzy.  You feel very thirsty.  You have urinated only a small amount of very dark urine over 6-8 hours. Get help right away if:  You have symptoms of severe dehydration.  You cannot drink fluids without vomiting.  Your symptoms get worse with treatment.  You have a fever.  You have a severe headache.  You have vomiting or diarrhea that: ? Gets worse. ? Does not go  away.  You have blood or green matter (bile) in your vomit.  You have blood in your stool. This may cause stool to look black and tarry.  You have not urinated in 6-8 hours.  You faint.  Your heart rate while sitting still is over 100 beats a minute.  You have trouble breathing. This information is not intended to replace advice given to you by your health care provider. Make sure you discuss any questions you have with your health care provider. Document Released: 01/02/2005 Document Revised: 07/30/2015 Document Reviewed: 02/26/2015 Elsevier Interactive Patient Education  2018 [employer] Upper Respiratory Infection, Adult Most upper respiratory infections (URIs) are a viral infection of the air passages leading to the  lungs. A URI affects the nose, throat, and upper air passages. The most common type of URI is nasopharyngitis and is typically referred to as "the common cold." URIs run their course and usually go away on their own. Most of the time, a URI does not require medical attention, but sometimes a bacterial infection in the upper airways can follow a viral infection. This is called a secondary infection. Sinus and middle ear infections are common types of secondary upper respiratory infections. Bacterial pneumonia can also complicate a URI. A URI can worsen asthma and chronic obstructive pulmonary disease (COPD). Sometimes, these complications can require emergency medical care and may be life threatening. What are the causes? Almost all URIs are caused by viruses. A virus is a type of germ and can spread from one person to another. What increases the risk? You may be at risk for a URI if:  You smoke.  You have chronic heart or lung disease.  You have a weakened defense (immune) system.  You are very young or very old.  You have nasal allergies or asthma.  You work in crowded or poorly ventilated areas.  You work in health care facilities or schools.  What are the signs or symptoms? Symptoms typically develop 2-3 days after you come in contact with a cold virus. Most viral URIs last 7-10 days. However, viral URIs from the influenza virus (flu virus) can last 14-18 days and are typically more severe. Symptoms may include:  Runny or stuffy (congested) nose.  Sneezing.  Cough.  Sore throat.  Headache.  Fatigue.  Fever.  Loss of appetite.  Pain in your forehead, behind your eyes, and over your cheekbones (sinus pain).  Muscle aches.  How is this diagnosed? Your health care provider may diagnose a URI by:  Physical exam.  Tests to check that your symptoms are not due to another condition such as: ? Strep throat. ? Sinusitis. ? Pneumonia. ? Asthma.  How is this treated? A  URI goes away on its own with time. It cannot be cured with medicines, but medicines may be prescribed or recommended to relieve symptoms. Medicines may help:  Reduce your fever.  Reduce your cough.  Relieve nasal congestion.  Follow these instructions at home:  Take medicines only as directed by your health care provider.  Gargle warm saltwater or take cough drops to comfort your throat as directed by your health care provider.  Use a warm mist humidifier or inhale steam from a shower to increase air moisture. This may make it easier to breathe.  Drink enough fluid to keep your urine clear or pale yellow.  Eat soups and other clear broths and maintain good nutrition.  Rest as needed.  Return to work when your temperature has returned to  normal or as your health care provider advises. You may need to stay home longer to avoid infecting others. You can also use a face mask and careful hand washing to prevent spread of the virus.  Increase the usage of your inhaler if you have asthma.  Do not use any tobacco products, including cigarettes, chewing tobacco, or electronic cigarettes. If you need help quitting, ask your health care provider. How is this prevented? The best way to protect yourself from getting a cold is to practice good hygiene.  Avoid oral or hand contact with people with cold symptoms.  Wash your hands often if contact occurs.  There is no clear evidence that vitamin C, vitamin E, echinacea, or exercise reduces the chance of developing a cold. However, it is always recommended to get plenty of rest, exercise, and practice good nutrition. Contact a health care provider if:  You are getting worse rather than better.  Your symptoms are not controlled by medicine.  You have chills.  You have worsening shortness of breath.  You have brown or red mucus.  You have yellow or brown nasal discharge.  You have pain in your face, especially when you bend  forward.  You have a fever.  You have swollen neck glands.  You have pain while swallowing.  You have white areas in the back of your throat. Get help right away if:  You have severe or persistent: ? Headache. ? Ear pain. ? Sinus pain. ? Chest pain.  You have chronic lung disease and any of the following: ? Wheezing. ? Prolonged cough. ? Coughing up blood. ? A change in your usual mucus.  You have a stiff neck.  You have changes in your: ? Vision. ? Hearing. ? Thinking. ? Mood. This information is not intended to replace advice given to you by your health care provider. Make sure you discuss any questions you have with your health care provider. Document Released: 06/28/2000 Document Revised: 09/05/2015 Document Reviewed: 04/09/2013 Elsevier Interactive Patient Education  2017 [employer].

## 2016-09-18 NOTE — MAU Provider Note (Signed)
History     CSN: [id number]  Arrival date and time: 09/18/16 1301   First Provider Initiated Contact with Patient 09/18/16 1411      Chief Complaint  Patient presents with  . Nasal Congestion  . Headache   Monica Munoz is a 26 y.o. G3P2001 at 10318w4d presenting with recent onset of upper respiratory symptoms. Her son has similar symptoms of congestion and runny nose. Cough is productive of clear phlegm. She has vomited phlegm and whatever she drinks 3-4 times since early morning and hasn't eaten due to nausea.  She has a throbbing headache is continuous and located over face and head.     URI   This is a new problem. The current episode started yesterday. The problem has been unchanged. There has been no fever. Associated symptoms include abdominal pain, congestion, coughing, headaches, nausea, rhinorrhea, sneezing, a sore throat and vomiting. Pertinent negatives include no chest pain, diarrhea, dysuria, ear pain, joint pain, neck pain, plugged ear sensation, sinus pain, swollen glands or wheezing. She has tried nothing for the symptoms.    OB History  Gravida Para Term Preterm AB Living  3 2 2  0 0 1  SAB TAB Ectopic Multiple Live Births  0 0 0 0 2    # Outcome Date GA Lbr Len/2nd Weight Sex Delivery Anes PTL Lv  3 Current           2 Term 10/13/14 4475w5d 10:42 / 00:15 8 lb 3 oz (3.714 kg) F Vag-Spont EPI  DEC  1 Term 05/27/13 2167w0d 02:30 / 02:19 8 lb 10.3 oz (3.92 kg) M Vag-Spont EPI  LIV    Obstetric Comments  Deceased at 11 months from cyst in throat.   '   Past Medical History:  Diagnosis Date  . Anxiety   . Asthma   . Depression   . 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 Migraine with aura 04/25/2012  . Pelvic inflammatory disease 04/25/2015    Past Surgical History:  Procedure Laterality Date  . WISDOM TOOTH EXTRACTION      Family History  Problem Relation Age of Onset  . Diabetes Maternal Grandfather   . Hyperlipidemia Maternal Grandfather   . Stroke Maternal  Grandfather   . Heart murmur Mother   . Miscarriages / [place] Mother   . Diabetes Mother   . Autism Paternal Uncle   . Down syndrome Paternal Uncle   . Stroke Maternal Grandmother     Social History  Substance Use Topics  . Smoking status: Never Smoker  . Smokeless tobacco: Never Used  . Alcohol use No    Allergies:  Allergies  Allergen Reactions  . Benadryl [Diphenhydramine Hcl] Other (See Comments)    Reaction:  Red Man Syndrome   . Reglan [Metoclopramide] Shortness Of Breath    Chest pain, face numb and red  . Lorazepam Other (See Comments)    Pt states that this med makes her feel "loopy".   . Lorazepam Other (See Comments)  . Vancomycin Itching  . Adhesive [Tape] Itching and Dermatitis    Prescriptions Prior to Admission  Medication Sig Dispense Refill Last Dose  . Prenatal Vit-Fe Fumarate-FA (PRENATAL MULTIVITAMIN) TABS tablet Take 1 tablet by mouth at bedtime.    09/18/2016 at Unknown time  . albuterol (PROAIR HFA) 108 (90 Base) MCG/ACT inhaler Inhale 2 puffs into the lungs 4 (four) times daily as needed for wheezing or shortness of breath.    prn  . cephALEXin (KEFLEX) 500 MG capsule Take 1  capsule (500 mg total) by mouth 4 (four) times daily. (Patient not taking: Reported on 09/06/2016) 28 capsule 0 Not Taking  . ondansetron (ZOFRAN-ODT) 4 MG disintegrating tablet Take 1 tablet (4 mg total) by mouth every 8 (eight) hours as needed for nausea or vomiting. (Patient not taking: Reported on 09/06/2016) 20 tablet 3 Not Taking  . phenazopyridine (PYRIDIUM) 200 MG tablet Take 1 tablet (200 mg total) by mouth 3 (three) times daily as needed for pain (urethral spasm). (Patient not taking: Reported on 09/06/2016) 10 tablet 1 Not Taking    Review of Systems  Constitutional: Positive for appetite change and fatigue. Negative for chills and fever.  HENT: Positive for congestion, postnasal drip, rhinorrhea, sneezing, sore throat and trouble swallowing. Negative for ear pain,  facial swelling and sinus pain.   Eyes: Negative for discharge.  Respiratory: Positive for cough. Negative for wheezing.   Cardiovascular: Negative for chest pain.  Gastrointestinal: Positive for abdominal pain, nausea and vomiting. Negative for diarrhea.       Mild contractions  Genitourinary: Negative for dysuria, flank pain, frequency, urgency, vaginal bleeding and vaginal discharge.  Musculoskeletal: Negative for joint pain and neck pain.  Neurological: Positive for headaches.  Psychiatric/Behavioral: Negative for agitation. The patient is not nervous/anxious.    Physical Exam   Blood pressure 114/85, pulse (!) 132, temperature 98.5 F (36.9 C), temperature source Oral, resp. rate 20, last menstrual period 12/17/2015, unknown if currently breastfeeding.  Pulse 123  Physical Exam  Nursing note and vitals reviewed. Constitutional: She is oriented to person, place, and time. She appears well-developed and well-nourished. No distress.  HENT:  Head: Normocephalic.  Mouth/Throat: Oropharynx is clear and moist. No oropharyngeal exudate.  NT to tap over frontal and maxillary sinuses  Eyes: Conjunctivae are normal.  Neck: Neck supple. No thyromegaly present.  Cardiovascular: Regular rhythm and normal heart sounds.   No murmur heard. Respiratory: Effort normal. No respiratory distress. She has no wheezes. She has no rales. She exhibits no tenderness.  Loose nonproductive cough  GI: Soft. There is no tenderness.  Term size gravid   Musculoskeletal: Normal range of motion.  Neurological: She is alert and oriented to person, place, and time.  Skin: Skin is warm and dry.  Psychiatric: She has a normal mood and affect. Her behavior is normal.    MAU Course  Procedures Results for orders placed or performed during the hospital encounter of 09/18/16 (from the past 24 hour(s))  Urinalysis, Routine w reflex microscopic     Status: Abnormal   Collection Time: 09/18/16  2:40 PM  Result  Value Ref Range   Color, Urine YELLOW YELLOW   APPearance HAZY (A) CLEAR   Specific Gravity, Urine 1.014 1.005 - 1.030   pH 7.0 5.0 - 8.0   Glucose, UA NEGATIVE NEGATIVE mg/dL   Hgb urine dipstick NEGATIVE NEGATIVE   Bilirubin Urine NEGATIVE NEGATIVE   Ketones, ur 80 (A) NEGATIVE mg/dL   Protein, ur NEGATIVE NEGATIVE mg/dL   Nitrite NEGATIVE NEGATIVE   Leukocytes, UA LARGE (A) NEGATIVE   RBC / HPF 0-5 0 - 5 RBC/hpf   WBC, UA 6-30 0 - 5 WBC/hpf   Bacteria, UA RARE (A) NONE SEEN   Squamous Epithelial / LPF 6-30 (A) NONE SEEN   Mucus PRESENT   urine culture sent   Fetal monitoring Fetal heart rate baseline 145-150, moderate variability, accelerations present, no decelerations Toco: Uterine irritability   Acetaminophen 1000mg  po, Zofran 5mg  ODT po given IVLR 1000 with Phenergan  25 mg > Headache improved Tolerating po's and no vomiting while observed in MAU  Discharge home on rest, fluids, gradual advancement of diet  Assessment and Plan  G3P2001 at [redacted]w[redacted]d FWB by EFM  1. Viral upper respiratory tract infection   2. Dehydration   3. Acute non intractable tension-type headache    Allergies as of 09/18/2016      Reactions   Benadryl [diphenhydramine Hcl] Other (See Comments)   Reaction:  Red Man Syndrome    Reglan [metoclopramide] Shortness Of Breath   Chest pain, face numb and red   Lorazepam Other (See Comments)   Pt states that this med makes her feel "loopy".    Lorazepam Other (See Comments)   Vancomycin Itching   Adhesive [tape] Itching, Dermatitis      Medication List    STOP taking these medications   cephALEXin 500 MG capsule Commonly known as:  KEFLEX   ondansetron 4 MG disintegrating tablet Commonly known as:  ZOFRAN ODT   phenazopyridine 200 MG tablet Commonly known as:  PYRIDIUM     TAKE these medications   guaiFENesin-dextromethorphan 100-10 MG/5ML syrup Commonly known as:  ROBITUSSIN DM Take 5 mLs by mouth every 4 (four) hours as needed for  cough.   ondansetron 4 MG tablet Commonly known as:  ZOFRAN Take 1 tablet (4 mg total) by mouth every 6 (six) hours.   prenatal multivitamin Tabs tablet Take 1 tablet by mouth at bedtime.   PROAIR HFA 108 (90 Base) MCG/ACT inhaler Generic drug:  albuterol Inhale 2 puffs into the lungs 4 (four) times daily as needed for wheezing or shortness of breath.   pseudoephedrine 30 MG tablet Commonly known as:  SUDAFED Take 1 tablet (30 mg total) by mouth every 4 (four) hours as needed for congestion.            Discharge Care Instructions        Start     Ordered   09/18/16 0000  pseudoephedrine (SUDAFED) 30 MG tablet  Every 4 hours PRN    Question:  Supervising Provider  Answer:  Lazaro Arms   09/18/16 1617   09/18/16 0000  ondansetron (ZOFRAN) 4 MG tablet  Every 6 hours    Question:  Supervising Provider  Answer:  Lazaro Arms   09/18/16 1617   09/18/16 0000  guaiFENesin-dextromethorphan (ROBITUSSIN DM) 100-10 MG/5ML syrup  Every 4 hours PRN    Question:  Supervising Provider  Answer:  Lazaro Arms   09/18/16 1631     Follow-up Information    [facility]   Specialty:  Obstetrics and Gynecology Contact information: [address] [place] [address] [phone]         Thierno Hun CNM 09/18/2016, 2:26 PM

## 2016-09-18 NOTE — MAU Note (Signed)
Urine sent to lab 

## 2016-09-20 ENCOUNTER — Telehealth: Payer: Self-pay | Admitting: *Deleted

## 2016-09-20 ENCOUNTER — Ambulatory Visit (INDEPENDENT_AMBULATORY_CARE_PROVIDER_SITE_OTHER): Payer: Medicaid Other | Admitting: Advanced Practice Midwife

## 2016-09-20 VITALS — BP 129/82 | HR 91 | Wt 165.4 lb

## 2016-09-20 DIAGNOSIS — Z3483 Encounter for supervision of other normal pregnancy, third trimester: Secondary | ICD-10-CM

## 2016-09-20 DIAGNOSIS — O368130 Decreased fetal movements, third trimester, not applicable or unspecified: Secondary | ICD-10-CM

## 2016-09-20 DIAGNOSIS — Z3480 Encounter for supervision of other normal pregnancy, unspecified trimester: Secondary | ICD-10-CM

## 2016-09-20 LAB — CULTURE, OB URINE

## 2016-09-20 NOTE — Progress Notes (Signed)
   PRENATAL VISIT NOTE  Subjective:  Monica Munoz is a 26 y.o. G3P2001 at 3754w6d being seen today for ongoing prenatal care.  She is currently monitored for the following issues for this low-risk pregnancy and has Anxiety state, unspecified; Asthma, mild intermittent; Depression; Headache, migraine; Supervision of normal pregnancy, antepartum; Death of infant; and Marijuana abuse on her problem list.  Patient reports occasional contractions.  Contractions: Irregular. Vag. Bleeding: None.  Movement: (!) Decreased. Denies leaking of fluid.   The following portions of the patient's history were reviewed and updated as appropriate: allergies, current medications, past family history, past medical history, past social history, past surgical history and problem list. Problem list updated.  Objective:   Vitals:   09/20/16 1002  BP: 129/82  Pulse: 91  Weight: 165 lb 6.4 oz (75 kg)    Fetal Status: Fetal Heart Rate (bpm): 119 Fundal Height: 38 cm Movement: (!) Decreased  Presentation: Vertex   NST reactive  General:  Alert, oriented and cooperative. Patient is in no acute distress.  Skin: Skin is warm and dry. No rash noted.   Cardiovascular: Normal heart rate noted  Respiratory: Normal respiratory effort, no problems with respiration noted  Abdomen: Soft, gravid, appropriate for gestational age.  Pain/Pressure: Present     Pelvic: Cervical exam performed Dilation: 3 Effacement (%): 50 Station: -2  Extremities: Normal range of motion.  Edema: Trace  Mental Status:  Normal mood and affect. Normal behavior. Normal judgment and thought content.   Assessment and Plan:  Pregnancy: G3P2001 at 2154w6d  1. Supervision of other normal pregnancy, antepartum  2. Decreased fetal mvmt - NST reactive active fetus  Term labor symptoms and general obstetric precautions including but not limited to vaginal bleeding, contractions, leaking of fluid and fetal movement were reviewed in detail with the  patient. Please refer to After Visit Summary for other counseling recommendations.  Return in about 1 week (around 09/27/2016) for ROB.   Dorathy KinsmanVirginia Donnette Macmullen, CNM

## 2016-09-20 NOTE — Patient Instructions (Signed)

## 2016-09-20 NOTE — Telephone Encounter (Signed)
Monica Munoz called front desk asking for someone to call her back, told registrar she thought we were calling her back.  Patient seen in office this am, per notes do not see that we were calling her back I called the number she left with front office [phone] and left a message I am calling back and sorry that I was late. Please call office in am.

## 2016-09-25 NOTE — Telephone Encounter (Addendum)
I called Altagracia back at number that was left and left another message I am returning your call, please call us if you still need information. Also called her mobile number and left a message I am returning your call, please call us back if you still have a question.

## 2016-09-27 ENCOUNTER — Ambulatory Visit (INDEPENDENT_AMBULATORY_CARE_PROVIDER_SITE_OTHER): Payer: Medicaid Other | Admitting: Advanced Practice Midwife

## 2016-09-27 ENCOUNTER — Encounter: Payer: Self-pay | Admitting: Advanced Practice Midwife

## 2016-09-27 VITALS — BP 130/76 | HR 94 | Wt 168.8 lb

## 2016-09-27 DIAGNOSIS — Z3480 Encounter for supervision of other normal pregnancy, unspecified trimester: Secondary | ICD-10-CM

## 2016-09-27 DIAGNOSIS — Z3483 Encounter for supervision of other normal pregnancy, third trimester: Secondary | ICD-10-CM

## 2016-09-27 IMAGING — CT CT ABD-PELV W/ CM
2 of 4 series · 16 of 46 positions shown, 18 images · IV contrast (APPLIED)
Comparison: None.

CLINICAL DATA: Generalized abdominal pain and back pain for 2 days

EXAM:
CT ABDOMEN AND PELVIS WITH CONTRAST
TECHNIQUE: Multidetector CT imaging of the abdomen and pelvis was performed
using the standard protocol following bolus administration of
intravenous contrast.
CONTRAST:  80 mL FOEACB-M11 IOPAMIDOL (FOEACB-M11) INJECTION 61%

[Series 2: abd/ pelvis 5.0 i30f 1 · axial · 0.78mm/px · z∈[-460,+0]mm · 13 of 100 slices shown, 15 images]
[im 4/100  soft-tissue]
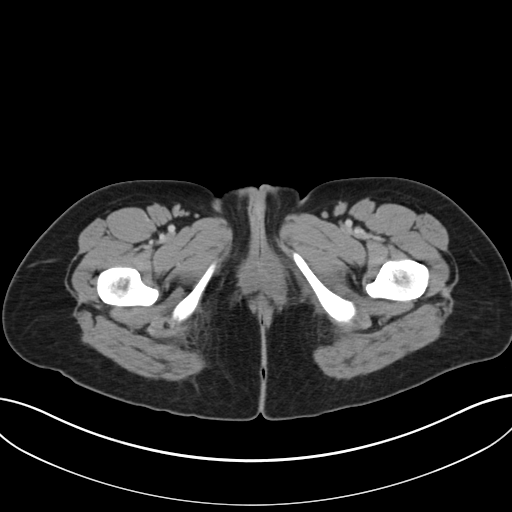
[im 4/100  bone]
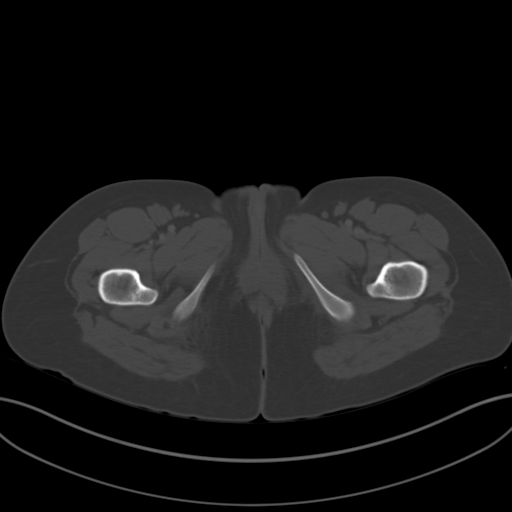
[im 12/100  soft-tissue]
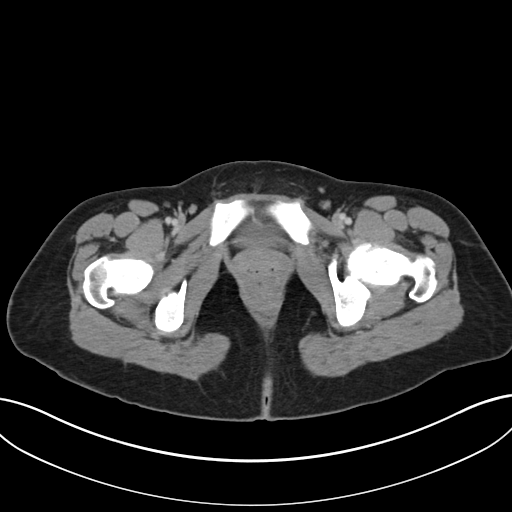
[im 20/100  soft-tissue]
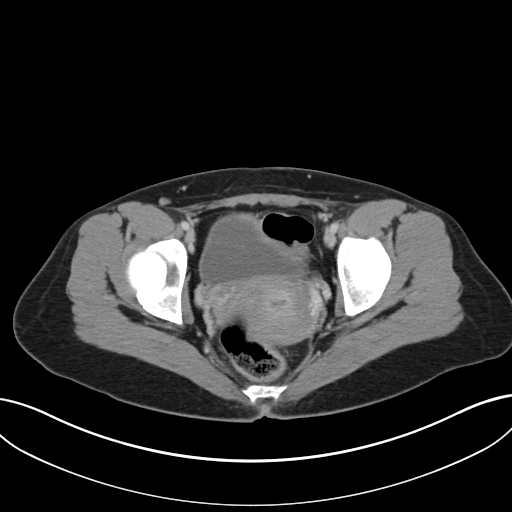
[im 27/100  soft-tissue]
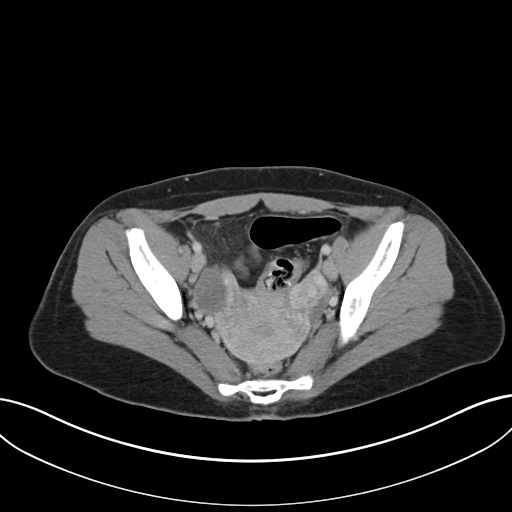
[im 35/100  soft-tissue]
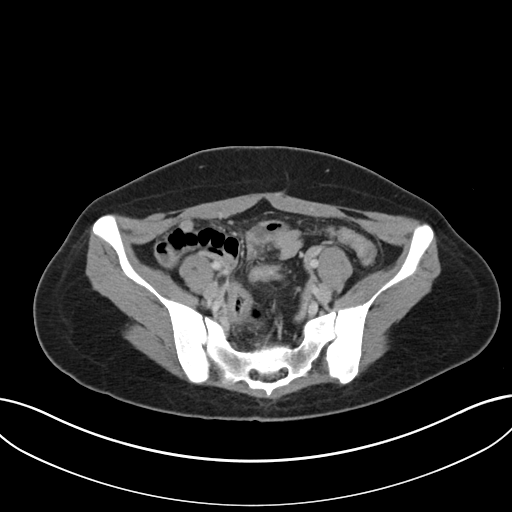
[im 42/100  soft-tissue]
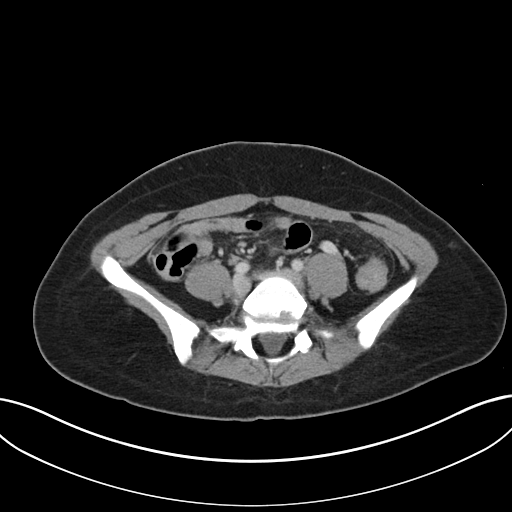
[im 50/100  soft-tissue]
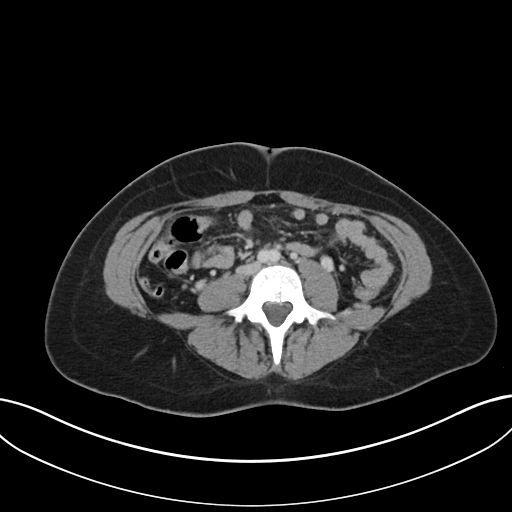
[im 58/100  soft-tissue]
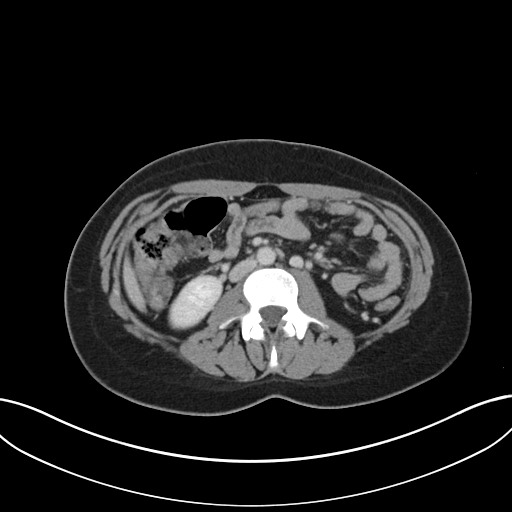
[im 65/100  soft-tissue]
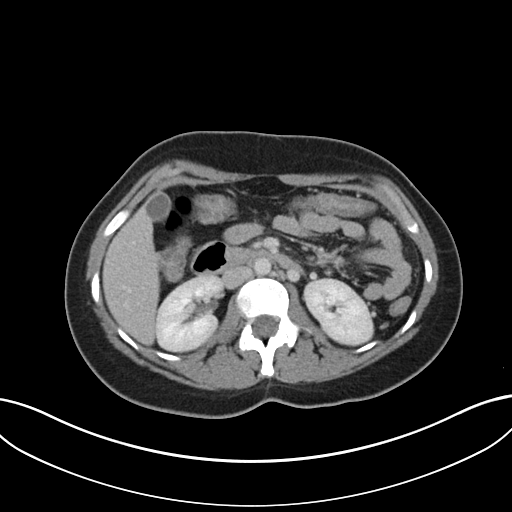
[im 65/100  bone]
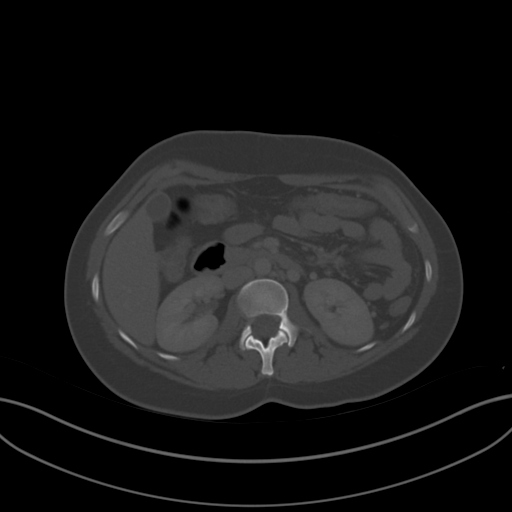
[im 73/100  soft-tissue]
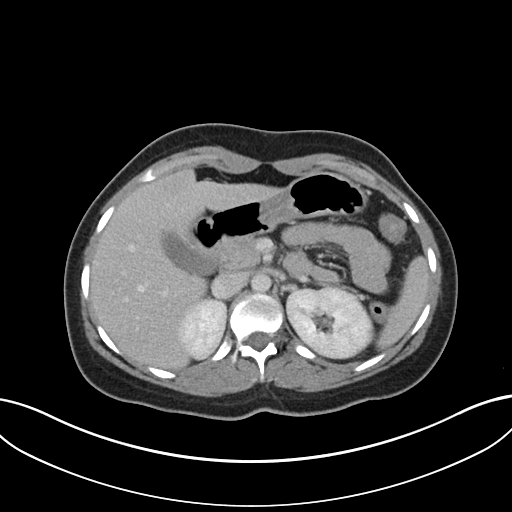
[im 80/100  soft-tissue]
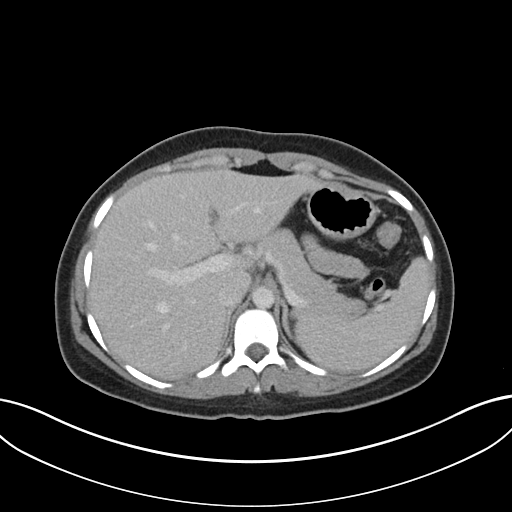
[im 88/100  soft-tissue]
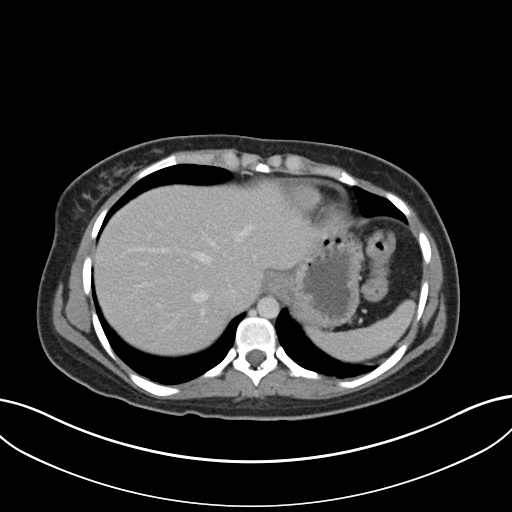
[im 96/100  soft-tissue]
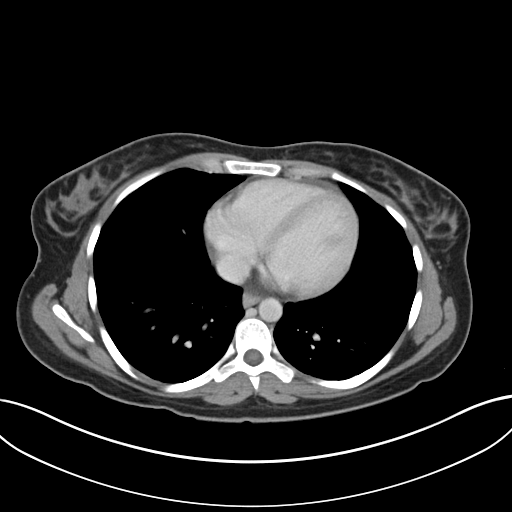

[Series 5: coronal soft tissue · coronal · 0.76mm/px · 3 of 78 slices shown]
[im 26/78  soft-tissue]
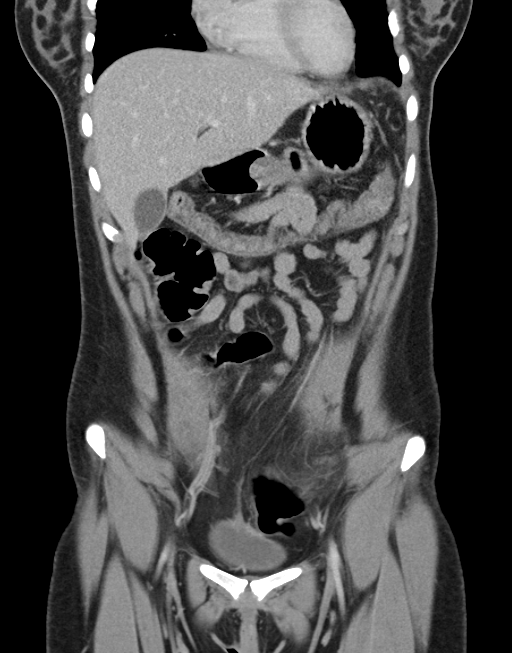
[im 35/78  soft-tissue]
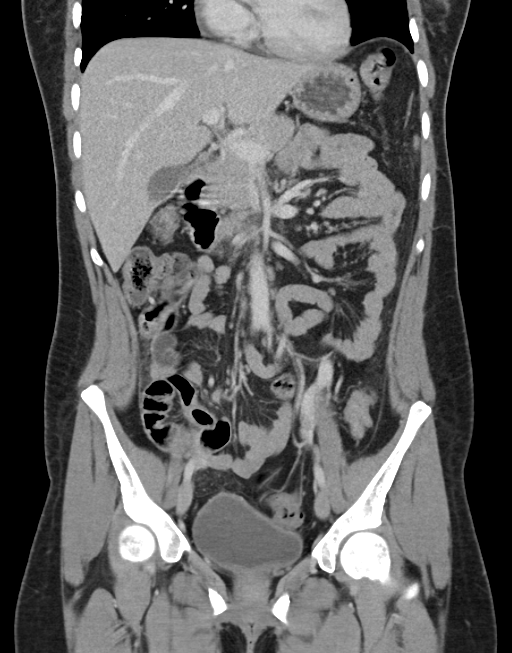
[im 43/78  soft-tissue]
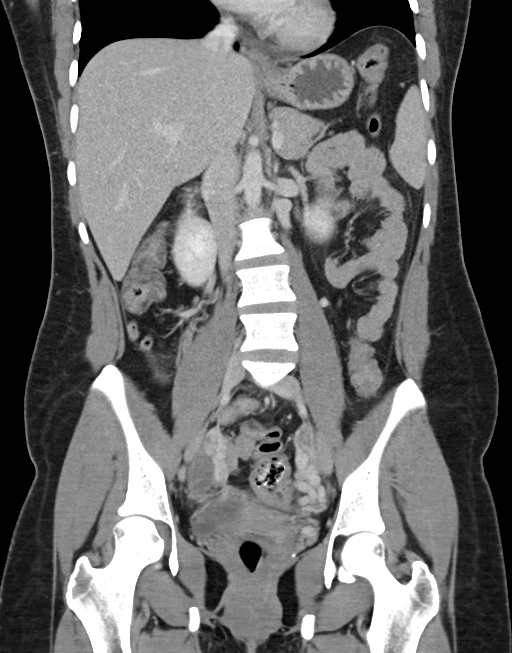

[16 of 46 positions shown; findings below may reference images not displayed]

FINDINGS: Lung bases are free of acute infiltrate or sizable effusion.

The liver, gallbladder, spleen, adrenal glands and pancreas are
within normal limits. The kidneys demonstrate a normal enhancement
pattern bilaterally. No renal calculi or obstructive changes are
noted.

The appendix is air-filled and within normal limits. The bladder is
partially distended. The uterus is within normal limits. Follicular
changes are noted within the ovaries bilaterally. Very minimal
pericolonic inflammatory changes noted in the distal descending
colon and extending into the proximal sigmoid colon. No abscess or
perforation is identified. Prominent vasculature is noted within the
pelvis which may represent some mild pelvic varices. No bony
abnormality is noted.
IMPRESSION: Changes consistent with focal colitis in the descending and sigmoid
colon. No other focal abnormality is noted.

## 2016-09-27 NOTE — Progress Notes (Signed)
C/o last 2 days having purplish / red vaginal discharge every time she voids and wipes.  Addendum:  2:30  Scheduled IOL for 10/05/16 0730 as requested. Called and notified Grissel.

## 2016-09-27 NOTE — Patient Instructions (Signed)
Breastfeeding Challenges and Solutions  Even though breastfeeding is natural, it can be challenging, especially in the first few weeks after childbirth. It is normal for problems to arise when starting to breastfeed your new baby, even if you have breastfed before. This document provides some solutions to the most common breastfeeding challenges.  Challenges and solutions  Challenge--Cracked or Sore Nipples  Cracked or sore nipples are commonly experienced by breastfeeding mothers. Cracked or sore nipples often are caused by inadequate latching (when your baby's mouth attaches to your breast to breastfeed). Soreness can also happen if your baby is not positioned properly at your breast. Although nipple cracking and soreness are common during the first week after birth, nipple pain is never normal. If you experience nipple cracking or soreness that lasts longer than 1 week or nipple pain, call your health care provider or lactation consultant.  Solution  Ensure proper latching and positioning of your baby by following the steps below:  · Find a comfortable place to sit or lie down, with your neck and back well supported.  · Place a pillow or rolled up blanket under your baby to bring him or her to the level of your breast (if you are seated).  · Make sure that your baby's abdomen is facing your abdomen.  · Gently massage your breast. With your fingertips, massage from your chest wall toward your nipple in a circular motion. This encourages milk flow. You may need to continue this action during the feeding if your milk flows slowly.  · Support your breast with 4 fingers underneath and your thumb above your nipple. Make sure your fingers are well away from your nipple and your baby’s mouth.  · Stroke your baby's lips gently with your finger or nipple.  · When your baby's mouth is open wide enough, quickly bring your baby to your breast, placing your entire nipple and as much of the colored area around your nipple  (areola) as possible into your baby's mouth.  ? More areola should be visible above your baby's upper lip than below the lower lip.  ? Your baby's tongue should be between his or her lower gum and your breast.  · Ensure that your baby's mouth is correctly positioned around your nipple (latched). Your baby's lips should create a seal on your breast and be turned out (everted).  · It is common for your baby to suck for about 2-3 minutes in order to start the flow of breast milk.    Signs that your baby has successfully latched on to your nipple include:  · Quietly tugging or quietly sucking without causing you pain.  · Swallowing heard between every 3-4 sucks.  · Muscle movement above and in front of his or her ears with sucking.    Signs that your baby has not successfully latched on to nipple include:  · Sucking sounds or smacking sounds from your baby while nursing.  · Nipple pain.    Ensure that your breasts stay moisturized and healthy by:  · Avoiding the use of soap on your nipples.  · Wearing a supportive bra. Avoid wearing underwire-style bras or tight bras.  · Air drying your nipples for 3-4 minutes after each feeding.  · Using only cotton bra pads to absorb breast milk leakage. Leaking of breast milk between feedings is normal. Be sure to change the pads if they become soaked with milk.  · Using lanolin on your nipples after nursing. Lanolin helps to maintain your   skin's normal moisture barrier. If you use pure lanolin you do not need to wash it off before feeding your baby again. Pure lanolin is not toxic to your baby. You may also hand express a few drops of breast milk and gently massage that milk into your nipples, allowing it to air dry.    Challenge--Breast Engorgement  Breast engorgement is the overfilling of your breasts with breast milk. In the first few weeks after giving birth, you may experience breast engorgement. Breast engorgement can make your breasts throb and feel hard, tightly stretched,  warm, and tender. Engorgement peaks about the fifth day after you give birth. Having breast engorgement does not mean you have to stop breastfeeding your baby.  Solution  · Breastfeed when you feel the need to reduce the fullness of your breasts or when your baby shows signs of hunger. This is called "breastfeeding on demand."  · Newborns (babies younger than 4 weeks) often breastfeed every 1-3 hours during the day. You may need to awaken your baby to feed if he or she is asleep at a feeding time.  · Do not allow your baby to sleep longer than 5 hours during the night without a feeding.  · Pump or hand express breast milk before breastfeeding to soften your breast, areola, and nipple.  · Apply warm, moist heat (in the shower or with warm water-soaked hand towels) just before feeding or pumping, or massage your breast before or during breastfeeding. This increases circulation and helps your milk to flow.  · Completely empty your breasts when breastfeeding or pumping. Afterward, wear a snug bra (nursing or regular) or tank top for 1-2 days to signal your body to slightly decrease milk production. Only wear snug bras or tank tops to treat engorgement. Tight bras typically should be avoided by breastfeeding mothers. Once engorgement is relieved, return to wearing regular, loose-fitting clothes.  · Apply ice packs to your breasts to lessen the pain from engorgement and relieve swelling, unless the ice is uncomfortable for you.  · Do not delay feedings. Try to relax when it is time to feed your baby. This helps to trigger your "let-down reflex," which releases milk from your breast.  · Ensure your baby is latched on to your breast and positioned properly while breastfeeding.  · Allow your baby to remain at your breast as long as he or she is latched on well and actively sucking. Your baby will let you know when he or she is done breastfeeding by pulling away from your breast or falling asleep.  · Avoid introducing bottles  or pacifiers to your baby in the early weeks of breastfeeding. Wait to introduce these things until after resolving any breastfeeding challenges.  · Try to pump your milk on the same schedule as when your baby would breastfeed if you are returning to work or away from home for an extended period.  · Drink plenty of fluids to avoid dehydration, which can eventually put you at greater risk of breast engorgement.    If you follow these suggestions, your engorgement should improve in 24-48 hours. If you are still experiencing difficulty, call your lactation consultant or health care provider.  Challenge--Plugged Milk Ducts  Plugged milk ducts occur when the duct does not drain milk effectively and becomes swollen. Wearing a tight-fitting nursing bra or having difficulty with latching may cause plugged milk ducts. Not drinking enough water (8-10 c [1.9-2.4 L] per day) can contribute to plugged milk ducts. Once a   duct has become plugged, hard lumps, soreness, and redness may develop in your breast.  Solution  Do not delay feedings. Feed your baby frequently and try to empty your breasts of milk at each feeding. Try breastfeeding from the affected side first so there is a better chance that the milk will drain completely from that breast. Apply warm, moist towels to your breasts for 5-10 minutes before feeding. Alternatively, a hot shower right before breastfeeding can provide the moist heat that can encourage milk flow. Gentle massage of the sore area before and during a feeding may also help. Avoid wearing tight clothing or bras that put pressure on your breasts. Wear bras that offer good support to your breasts, but avoid underwire bras. If you have a plugged milk duct and develop a fever, you need to see your health care provider.  Challenge--Mastitis  Mastitis is inflammation of your breast. It usually is caused by a bacterial infection and can cause flu-like symptoms. You may develop redness in your breast and a  fever. Often when mastitis occurs, your breast becomes firm, warm, and very painful. The most common causes of mastitis are poor latching, ineffective sucking from your baby, consistent pressure on your breast (possibly from wearing a tight-fitting bra or shirt that restricts the milk flow), unusual stress or fatigue, or missed feedings.  Solution  You will be given antibiotic medicine to treat the infection. It is still important to breastfeed frequently to empty your breasts. Continuing to breastfeed while you recover from mastitis will not harm your baby. Make sure your baby is positioned properly during every feeding. Apply moist heat to your breasts for a few minutes before feeding to help the milk flow and to help your breasts empty more easily.  Challenge--Thrush  Thrush is a yeast infection that can form on your nipples, in your breast, or in your baby's mouth. It causes itching, soreness, burning or stabbing pain, and sometimes a rash.  Solution  You will be given a medicated ointment for your nipples, and your baby will be given a liquid medicine for his or her mouth. It is important that you and your baby are treated at the same time because thrush can be passed between you and your baby. Change disposable nursing pads often. Any bras, towels, or clothing that come in contact with infected areas of your body or your baby's body need to be washed in very hot water every day. Wash your hands and your baby's hands often. All pacifiers, bottle nipples, or toys your baby puts in his or her mouth should be boiled once a day for 20 minutes. After 1 week of treatment, discard pacifiers and bottle nipples and buy new ones. All breast pump parts that touch the milk need to be boiled for 20 minutes every day.  Challenge--Low Milk Supply  You may not be producing enough milk if your baby is not gaining the proper amount of weight. Breast milk production is based on a supply-and-demand system. Your milk supply depends  on how frequently and effectively your baby empties your breast.  Solution  The more you breastfeed and pump, the more breast milk you will produce. It is important that your baby empties at least one of your breasts at each feeding. If this is not happening, then use a breast pump or hand express any milk that remains. This will help to drain as much milk as possible at each feeding. It will also signal your body to produce more   milk. If your baby is not emptying your breasts, it may be due to latching, sucking, or positioning problems. If low milk supply continues after addressing these issues, contact your health care provider or a lactation specialist as soon as possible.  Challenge--Inverted or Flat Nipples  Some women have nipples that turn inward instead of protruding outward. Other women have nipples that are flat. Inverted or flat nipples can sometimes make it more difficult for your baby to latch onto your breast.  Solution  You may be given a small device that pulls out inverted nipples. This device should be applied right before your baby is brought to your breast. You can also try using a breast pump for a short time before placing the baby at your breast. The pump can pull your nipple outwards to help your infant latch more easily. The baby's sucking motion will help the inverted nipple protrude as well.  If you have flat nipples, encourage your baby to latch onto your breast and feed frequently in the early days after birth. This will give your baby practice latching on correctly while your breast is still soft. When your milk supply increases, between the second and fifth day after birth and your breasts become full, your baby will have an easier time latching.  Contact a lactation consultant if you still have concerns. She or he can teach you additional techniques to address breastfeeding problems related to nipple shape and position.  Where to find more information:  [employer]:  www.llli.org  This information is not intended to replace advice given to you by your health care provider. Make sure you discuss any questions you have with your health care provider.  Document Released: 06/26/2005 Document Revised: 06/16/2015 Document Reviewed: 06/28/2012  Elsevier Interactive Patient Education © 2017 [employer]

## 2016-09-27 NOTE — Progress Notes (Signed)
   PRENATAL VISIT NOTE  Subjective:  Monica Munoz is a 26 y.o. G3P2001 at 24 w6d being seen today for ongoing prenatal care.  She is currently monitored for the following issues for this high-risk pregnancy and has Anxiety state, unspecified; Asthma, mild intermittent; Depression; Headache, migraine; Supervision of normal pregnancy, antepartum; Death of infant; and Marijuana abuse on her problem list.  Patient reports occasional contractions.  Contractions: Irregular. Vag. Bleeding: Bloody Show.  Movement: Present. Denies leaking of fluid.   The following portions of the patient's history were reviewed and updated as appropriate: allergies, current medications, past family history, past medical history, past social history, past surgical history and problem list. Problem list updated.  Objective:   Vitals:   09/27/16 0859  BP: 130/76  Pulse: 94  Weight: 168 lb 12.8 oz (76.6 kg)    Fetal Status: Fetal Heart Rate (bpm): 120 Fundal Height: 39 cm Movement: Present  Presentation: Vertex  General:  Alert, oriented and cooperative. Patient is in no acute distress.  Skin: Skin is warm and dry. No rash noted.   Cardiovascular: Normal heart rate noted  Respiratory: Normal respiratory effort, no problems with respiration noted  Abdomen: Soft, gravid, appropriate for gestational age.  Pain/Pressure: Present     Pelvic: Cervical exam performed Dilation: 3.5 Effacement (%): 50 Station: -2  Extremities: Normal range of motion.  Edema: None  Mental Status:  Normal mood and affect. Normal behavior. Normal judgment and thought content.   Assessment and Plan:  Pregnancy: G3P2001 at 24 w6d  1. Supervision of other normal pregnancy, antepartum   Term labor symptoms and general obstetric precautions including but not limited to vaginal bleeding, contractions, leaking of fluid and fetal movement were reviewed in detail with the patient. Please refer to After Visit Summary for other counseling  recommendations.  Return in about 1 week (around 10/04/2016) for ROB.  Requesting that IOL be before 41 weeks because it falls on her deceased baby's birthday. Will discuss w/ attending.    Monica Munoz, CNM

## 2016-09-28 ENCOUNTER — Telehealth (HOSPITAL_COMMUNITY): Payer: Self-pay | Admitting: *Deleted

## 2016-09-28 NOTE — Telephone Encounter (Signed)
Preadmission screen  

## 2016-09-29 ENCOUNTER — Encounter (HOSPITAL_COMMUNITY): Payer: Self-pay | Admitting: *Deleted

## 2016-09-29 ENCOUNTER — Telehealth (HOSPITAL_COMMUNITY): Payer: Self-pay | Admitting: *Deleted

## 2016-09-29 NOTE — Telephone Encounter (Signed)
Preadmission screen  

## 2016-10-03 ENCOUNTER — Encounter (HOSPITAL_COMMUNITY): Payer: Self-pay

## 2016-10-03 ENCOUNTER — Inpatient Hospital Stay (HOSPITAL_COMMUNITY)
Admission: AD | Admit: 2016-10-03 | Discharge: 2016-10-05 | DRG: 775 | Disposition: A | Discharge status: Home / Self Care | Payer: Medicaid Other | Source: Ambulatory Visit | Attending: Family Medicine | Admitting: Family Medicine

## 2016-10-03 ENCOUNTER — Observation Stay (HOSPITAL_COMMUNITY): Payer: Medicaid Other | Admitting: Anesthesiology

## 2016-10-03 ENCOUNTER — Inpatient Hospital Stay (HOSPITAL_COMMUNITY): Payer: Medicaid Other

## 2016-10-03 DIAGNOSIS — J45909 Unspecified asthma, uncomplicated: Secondary | ICD-10-CM | POA: Diagnosis present

## 2016-10-03 DIAGNOSIS — Z3480 Encounter for supervision of other normal pregnancy, unspecified trimester: Secondary | ICD-10-CM

## 2016-10-03 DIAGNOSIS — Z3A39 39 weeks gestation of pregnancy: Secondary | ICD-10-CM

## 2016-10-03 DIAGNOSIS — O9952 Diseases of the respiratory system complicating childbirth: Secondary | ICD-10-CM | POA: Diagnosis present

## 2016-10-03 DIAGNOSIS — O9902 Anemia complicating childbirth: Secondary | ICD-10-CM | POA: Diagnosis present

## 2016-10-03 DIAGNOSIS — O4693 Antepartum hemorrhage, unspecified, third trimester: Secondary | ICD-10-CM | POA: Diagnosis present

## 2016-10-03 DIAGNOSIS — O99324 Drug use complicating childbirth: Secondary | ICD-10-CM | POA: Diagnosis present

## 2016-10-03 DIAGNOSIS — D649 Anemia, unspecified: Secondary | ICD-10-CM | POA: Diagnosis present

## 2016-10-03 DIAGNOSIS — O321XX1 Maternal care for breech presentation, fetus 1: Secondary | ICD-10-CM | POA: Diagnosis present

## 2016-10-03 DIAGNOSIS — F1290 Cannabis use, unspecified, uncomplicated: Secondary | ICD-10-CM | POA: Diagnosis present

## 2016-10-03 DIAGNOSIS — O321XX0 Maternal care for breech presentation, not applicable or unspecified: Secondary | ICD-10-CM | POA: Diagnosis present

## 2016-10-03 LAB — TYPE AND SCREEN
ABO/RH(D): A POS
ANTIBODY SCREEN: NEGATIVE

## 2016-10-03 LAB — CBC
HCT: 31.7 % — ABNORMAL LOW (ref 36.0–46.0)
Hemoglobin: 11 g/dL — ABNORMAL LOW (ref 12.0–15.0)
MCH: 32.5 pg (ref 26.0–34.0)
MCHC: 34.7 g/dL (ref 30.0–36.0)
MCV: 93.8 fL (ref 78.0–100.0)
PLATELETS: 262 10*3/uL (ref 150–400)
RBC: 3.38 MIL/uL — ABNORMAL LOW (ref 3.87–5.11)
RDW: 12.9 % (ref 11.5–15.5)
WBC: 16.2 10*3/uL — ABNORMAL HIGH (ref 4.0–10.5)

## 2016-10-03 LAB — RPR: RPR Ser Ql: NONREACTIVE

## 2016-10-03 MED ORDER — MEASLES, MUMPS & RUBELLA VAC ~~LOC~~ INJ: 0.5000 mL | INJECTION | Freq: Once | SUBCUTANEOUS | Status: DC

## 2016-10-03 MED ORDER — ONDANSETRON HCL 4 MG PO TABS: 4.0000 mg | ORAL_TABLET | ORAL | Status: DC | PRN

## 2016-10-03 MED ORDER — OXYCODONE-ACETAMINOPHEN 5-325 MG PO TABS: 1.0000 | ORAL_TABLET | ORAL | Status: DC | PRN

## 2016-10-03 MED ORDER — LACTATED RINGERS IV SOLN
INTRAVENOUS | Status: DC
  Administered 2016-10-03: 10:00:00 via INTRAVENOUS
  Administered 2016-10-03: 125 mL/h via INTRAVENOUS

## 2016-10-03 MED ORDER — BENZOCAINE-MENTHOL 20-0.5 % EX AERO
1.0000 "application " | INHALATION_SPRAY | CUTANEOUS | Status: DC | PRN
  Administered 2016-10-03: 1 via TOPICAL
  Filled 2016-10-03: qty 56

## 2016-10-03 MED ORDER — LIDOCAINE HCL (PF) 1 % IJ SOLN
30.0000 mL | INTRAMUSCULAR | Status: DC | PRN
  Filled 2016-10-03: qty 30

## 2016-10-03 MED ORDER — ONDANSETRON HCL 4 MG/2ML IJ SOLN: 4.0000 mg | INTRAMUSCULAR | Status: DC | PRN

## 2016-10-03 MED ORDER — OXYCODONE-ACETAMINOPHEN 5-325 MG PO TABS: 2.0000 | ORAL_TABLET | ORAL | Status: DC | PRN

## 2016-10-03 MED ORDER — LIDOCAINE HCL (PF) 1 % IJ SOLN
INTRAMUSCULAR | Status: DC | PRN
  Administered 2016-10-03: 2 mL via EPIDURAL
  Administered 2016-10-03: 5 mL via EPIDURAL
  Administered 2016-10-03: 3 mL via EPIDURAL

## 2016-10-03 MED ORDER — OXYTOCIN BOLUS FROM INFUSION
500.0000 mL | Freq: Once | INTRAVENOUS | Status: AC
  Administered 2016-10-03: 500 mL via INTRAVENOUS

## 2016-10-03 MED ORDER — SIMETHICONE 80 MG PO CHEW: 80.0000 mg | CHEWABLE_TABLET | ORAL | Status: DC | PRN

## 2016-10-03 MED ORDER — FERROUS SULFATE 325 (65 FE) MG PO TABS
325.0000 mg | ORAL_TABLET | Freq: Two times a day (BID) | ORAL | Status: DC
  Administered 2016-10-04 – 2016-10-05 (×3): 325 mg via ORAL
  Filled 2016-10-03 (×3): qty 1

## 2016-10-03 MED ORDER — PHENYLEPHRINE 40 MCG/ML (10ML) SYRINGE FOR IV PUSH (FOR BLOOD PRESSURE SUPPORT)
80.0000 ug | PREFILLED_SYRINGE | INTRAVENOUS | Status: DC | PRN
  Filled 2016-10-03: qty 5

## 2016-10-03 MED ORDER — PHENYLEPHRINE 40 MCG/ML (10ML) SYRINGE FOR IV PUSH (FOR BLOOD PRESSURE SUPPORT)
80.0000 ug | PREFILLED_SYRINGE | INTRAVENOUS | Status: DC | PRN
  Filled 2016-10-03: qty 5
  Filled 2016-10-03: qty 10

## 2016-10-03 MED ORDER — MAGNESIUM HYDROXIDE 400 MG/5ML PO SUSP: 30.0000 mL | ORAL | Status: DC | PRN

## 2016-10-03 MED ORDER — OXYTOCIN 40 UNITS IN LACTATED RINGERS INFUSION - SIMPLE MED
1.0000 m[IU]/min | INTRAVENOUS | Status: DC
  Administered 2016-10-03: 2 m[IU]/min via INTRAVENOUS

## 2016-10-03 MED ORDER — LACTATED RINGERS IV SOLN: 500.0000 mL | INTRAVENOUS | Status: DC | PRN

## 2016-10-03 MED ORDER — ZOLPIDEM TARTRATE 5 MG PO TABS: 5.0000 mg | ORAL_TABLET | Freq: Every evening | ORAL | Status: DC | PRN

## 2016-10-03 MED ORDER — TERBUTALINE SULFATE 1 MG/ML IJ SOLN
0.2500 mg | Freq: Once | INTRAMUSCULAR | Status: AC
  Administered 2016-10-03: 0.25 mg via SUBCUTANEOUS
  Filled 2016-10-03: qty 1

## 2016-10-03 MED ORDER — NALBUPHINE HCL 10 MG/ML IJ SOLN: 5.0000 mg | INTRAMUSCULAR | Status: DC | PRN

## 2016-10-03 MED ORDER — COCONUT OIL OIL: 1.0000 "application " | TOPICAL_OIL | Status: DC | PRN

## 2016-10-03 MED ORDER — EPHEDRINE 5 MG/ML INJ
10.0000 mg | INTRAVENOUS | Status: DC | PRN
  Filled 2016-10-03: qty 2

## 2016-10-03 MED ORDER — SOD CITRATE-CITRIC ACID 500-334 MG/5ML PO SOLN: 30.0000 mL | ORAL | Status: DC | PRN

## 2016-10-03 MED ORDER — LACTATED RINGERS IV SOLN
500.0000 mL | Freq: Once | INTRAVENOUS | Status: AC
  Administered 2016-10-03: 500 mL via INTRAVENOUS

## 2016-10-03 MED ORDER — WITCH HAZEL-GLYCERIN EX PADS: 1.0000 "application " | MEDICATED_PAD | CUTANEOUS | Status: DC | PRN

## 2016-10-03 MED ORDER — OXYTOCIN 40 UNITS IN LACTATED RINGERS INFUSION - SIMPLE MED
2.5000 [IU]/h | INTRAVENOUS | Status: DC
  Filled 2016-10-03: qty 1000

## 2016-10-03 MED ORDER — LACTATED RINGERS IV SOLN: 500.0000 mL | Freq: Once | INTRAVENOUS | Status: DC

## 2016-10-03 MED ORDER — ACETAMINOPHEN 325 MG PO TABS: 650.0000 mg | ORAL_TABLET | ORAL | Status: DC | PRN

## 2016-10-03 MED ORDER — PRENATAL MULTIVITAMIN CH
1.0000 | ORAL_TABLET | Freq: Every day | ORAL | Status: DC
  Administered 2016-10-04 – 2016-10-05 (×2): 1 via ORAL
  Filled 2016-10-03 (×2): qty 1

## 2016-10-03 MED ORDER — ONDANSETRON HCL 4 MG/2ML IJ SOLN: 4.0000 mg | Freq: Four times a day (QID) | INTRAMUSCULAR | Status: DC | PRN

## 2016-10-03 MED ORDER — FENTANYL 2.5 MCG/ML BUPIVACAINE 1/10 % EPIDURAL INFUSION (WH - ANES)
14.0000 mL/h | INTRAMUSCULAR | Status: DC | PRN
  Administered 2016-10-03: 14 mL/h via EPIDURAL
  Filled 2016-10-03: qty 100

## 2016-10-03 MED ORDER — TERBUTALINE SULFATE 1 MG/ML IJ SOLN
0.2500 mg | Freq: Once | INTRAMUSCULAR | Status: DC | PRN
  Filled 2016-10-03: qty 1

## 2016-10-03 MED ORDER — IBUPROFEN 600 MG PO TABS
600.0000 mg | ORAL_TABLET | Freq: Four times a day (QID) | ORAL | Status: DC
  Administered 2016-10-03 – 2016-10-05 (×8): 600 mg via ORAL
  Filled 2016-10-03 (×8): qty 1

## 2016-10-03 MED ORDER — TETANUS-DIPHTH-ACELL PERTUSSIS 5-2.5-18.5 LF-MCG/0.5 IM SUSP: 0.5000 mL | Freq: Once | INTRAMUSCULAR | Status: DC

## 2016-10-03 MED ORDER — DIBUCAINE 1 % RE OINT: 1.0000 "application " | TOPICAL_OINTMENT | RECTAL | Status: DC | PRN

## 2016-10-03 NOTE — H&P (Addendum)
Monica Munoz is an 26 y.o. G3P2001 [redacted]w[redacted]d female.   Chief Complaint: Vaginal bleeding HPI:  Monica Munoz is a 26 y.o. G3P2001 at [redacted]w[redacted]d who presents today with vaginal bleeding. She states that the bleeding started around 0100. She got up to use the bathroom, and felt "a gush of something". She saw it was blood. She denies any pain or contractions. She reports normal fetal movement.   Past Medical History:  Diagnosis Date  . Anxiety   . Asthma   . Borderline personality disorder   . Depression   . 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 Manic depression (HCC)   . Migraine with aura 04/25/2012  . Pelvic inflammatory disease 04/25/2015    Past Surgical History:  Procedure Laterality Date  . WISDOM TOOTH EXTRACTION      Family History  Problem Relation Age of Onset  . Diabetes Maternal Grandfather   . Hyperlipidemia Maternal Grandfather   . Stroke Maternal Grandfather   . Cancer Maternal Grandfather   . Hypertension Maternal Grandfather   . Heart disease Maternal Grandfather   . Heart murmur Mother   . Miscarriages / [place] Mother   . Diabetes Mother   . Stroke Mother   . Lupus Mother    Social History:  reports that she has never smoked. She has never used smokeless tobacco. She reports that she does not drink alcohol or use drugs.    Allergies  Allergen Reactions  . Benadryl [Diphenhydramine Hcl] Other (See Comments)    Reaction:  Red Man Syndrome   . Reglan [Metoclopramide] Shortness Of Breath    Chest pain, face numb and red  . Lorazepam Other (See Comments)    Pt states that this med makes her feel "loopy".   . Lorazepam Other (See Comments)  . Vancomycin Itching  . Adhesive [Tape] Itching and Dermatitis    No current facility-administered medications on file prior to encounter.    Current Outpatient Prescriptions on File Prior to Encounter  Medication Sig Dispense Refill  . Prenatal Vit-Fe Fumarate-FA (PRENATAL MULTIVITAMIN) TABS tablet Take 1 tablet by mouth  at bedtime.     Marland Kitchen albuterol (PROAIR HFA) 108 (90 Base) MCG/ACT inhaler Inhale 2 puffs into the lungs 4 (four) times daily as needed for wheezing or shortness of breath.     . guaiFENesin-dextromethorphan (ROBITUSSIN DM) 100-10 MG/5ML syrup Take 5 mLs by mouth every 4 (four) hours as needed for cough. 118 mL 0  . ondansetron (ZOFRAN) 4 MG tablet Take 1 tablet (4 mg total) by mouth every 6 (six) hours. 12 tablet 0  . pseudoephedrine (SUDAFED) 30 MG tablet Take 1 tablet (30 mg total) by mouth every 4 (four) hours as needed for congestion. 30 tablet 0    Pertinent items are noted in HPI.  Blood pressure (!) 114/54, pulse (!) 56, temperature 98.3 F (36.8 C), temperature source Oral, resp. rate 16, height  (1.651 m), weight 168 lb (76.2 kg), last menstrual period 12/17/2015, SpO2 100 %, unknown if currently breastfeeding. General appearance: alert, cooperative and appears stated age Head: Normocephalic, without obvious abnormality, atraumatic Neck: supple, symmetrical, trachea midline Lungs: normal effort Heart: regular rate and rhythm Abdomen: soft, non-tender; bowel sounds normal; no masses,  no organomegaly Extremities: Homans sign is negative, no sign of DVT Skin: Skin color, texture, turgor normal. No rashes or lesions Neurologic: Grossly normal Dilation: 3.5 Presentation: Vertex Exam by:: HEATHER, CNM U/S reveals breech presentation NST:  Baseline: 130 bpm, Variability: Good {> 6 bpm), Accelerations: Reactive and  Decelerations: Absent    Lab Results  Component Value Date   WBC 16.2 (H) 10/03/2016   HGB 11.0 (L) 10/03/2016   HCT 31.7 (L) 10/03/2016   MCV 93.8 10/03/2016   PLT 262 10/03/2016         ABO, Rh: --/--/A POS (09/18 0303)  Antibody: NEG (09/18 0303)  Rubella: !Error!Immune RPR: Non Reactive (06/21 1016)  HBsAg: Negative (03/01 1115)  HIV:   Non-reactive GBS: Negative (08/22 0000)     Assessment/Plan Principal Problem:   Vaginal bleeding in pregnancy,  third trimester Active Problems:   Breech presentation  For ECV followed by IOL  Reva Bores 10/03/2016, 6:49 AM

## 2016-10-03 NOTE — Anesthesia Pain Management Evaluation Note (Signed)
  CRNA Pain Management Visit Note  Patient: Monica Munoz, 26 y.o., female  "Hello I am a member of the anesthesia team at [facility]. We have an anesthesia team available at all times to provide care throughout the hospital, including epidural management and anesthesia for C-section. I don't know your plan for the delivery whether it a natural birth, water birth, IV sedation, nitrous supplementation, doula or epidural, but we want to meet your pain goals."   1.Was your pain managed to your expectations on prior hospitalizations?   Yes and No. The patient said that she has scoliosis. She has had 2 previous epidurals at [facility]. One worked very well and the other didn't wok at all. She was told                  that the reason that  one didn't work is that she was not positioned correctly to get the epidural placed.   2.What is your expectation for pain management during this hospitalization?     Epidural  3.How can we help you reach that goal? "  Position the patient correctly to place the epidural."  Record the patient's initial score and the patient's pain goal.   Pain: 0  Pain Goal: 7 [facility] wants you to be able to say your pain was always managed very well.  Rosibel Giacobbe 10/03/2016

## 2016-10-03 NOTE — Progress Notes (Signed)
UR chart review completed.  

## 2016-10-03 NOTE — Plan of Care (Signed)
Problem: Coping: Goal: Ability to cope will improve Outcome: Progressing Patient talked about her daughter that passed away last year on 10/11/2015.  She seems to be handling the grief ok but did get upset with her mother when she continued to talk about the death of her daughter.  She stated she wants to focus on the happy moments right now.  Emotional support provided to patient.

## 2016-10-03 NOTE — Anesthesia Postprocedure Evaluation (Signed)
Anesthesia Post Note  Patient: Monica Munoz  Procedure(s) Performed: * No procedures listed *     Patient location during evaluation: Mother Baby Anesthesia Type: Epidural Level of consciousness: awake Pain management: satisfactory to patient Vital Signs Assessment: post-procedure vital signs reviewed and stable Respiratory status: spontaneous breathing Cardiovascular status: stable Anesthetic complications: no    Last Vitals:  Vitals:   10/03/16 1657 10/03/16 2042  BP: 122/76 (!) 117/57  Pulse:  79  Resp:  18  Temp: 37.4 C 36.7 C  SpO2: 98%     Last Pain:  Vitals:   10/03/16 2042  TempSrc: Oral  PainSc: 0-No pain   Pain Goal:                 [employer]

## 2016-10-03 NOTE — Progress Notes (Signed)
This RN was unable to go over admission paperwork in depth and was not able to get fall prevention plan and baby safety info sheet signed due to patient requesting that I wait.  She first wanted to shower, then was sleeping and also had visitors.  She was instructed to call out before getting up the first time and to have a staff member with her, as well as the call out being explained as well as how to order meals.  RN educated patient on when the infant can be bathed, when she can order food, etc.  Patient is currently sleeping with infant sleeping in the bassinet.  FOB and Maternal grandmother is in the room and requested that I not wake her at this time.  Report given to nightshift RN Malachi Bonds.

## 2016-10-03 NOTE — Anesthesia Preprocedure Evaluation (Signed)
Anesthesia Evaluation  Patient identified by MRN, date of birth, ID band Patient awake    Reviewed: Allergy & Precautions, NPO status , Patient's Chart, lab work & pertinent test results  Airway Mallampati: II  TM Distance: >3 FB Neck ROM: Full    Dental  (+) Teeth Intact, Dental Advisory Given, Chipped, Missing   Pulmonary asthma ,    Pulmonary exam normal breath sounds clear to auscultation       Cardiovascular negative cardio ROS Normal cardiovascular exam Rhythm:Regular Rate:Normal     Neuro/Psych  Headaches, PSYCHIATRIC DISORDERS Anxiety Depression Bipolar Disorder    GI/Hepatic negative GI ROS, Neg liver ROS,   Endo/Other  negative endocrine ROS  Renal/GU negative Renal ROS   Fitz-Hugh-Curtis syndrome    Musculoskeletal Scoliosis   Abdominal   Peds  Hematology  (+) Blood dyscrasia, anemia , Plt 262k   Anesthesia Other Findings Day of surgery medications reviewed with the patient.  Reproductive/Obstetrics (+) Pregnancy                             Anesthesia Physical Anesthesia Plan  ASA: II  Anesthesia Plan: Epidural   Post-op Pain Management:    Induction:   PONV Risk Score and Plan: Treatment may vary due to age or medical condition  Airway Management Planned:   Additional Equipment:   Intra-op Plan:   Post-operative Plan:   Informed Consent: I have reviewed the patients History and Physical, chart, labs and discussed the procedure including the risks, benefits and alternatives for the proposed anesthesia with the patient or authorized representative who has indicated his/her understanding and acceptance.   Dental advisory given  Plan Discussed with:   Anesthesia Plan Comments: (Patient identified. Risks/Benefits/Options discussed with patient including but not limited to bleeding, infection, nerve damage, paralysis, failed block, incomplete pain control, headache,  blood pressure changes, nausea, vomiting, reactions to medication both or allergic, itching and postpartum back pain. Confirmed with bedside nurse the patient's most recent platelet count. Confirmed with patient that they are not currently taking any anticoagulation, have any bleeding history or any family history of bleeding disorders. Patient expressed understanding and wished to proceed. All questions were answered. )        Anesthesia Quick Evaluation

## 2016-10-03 NOTE — Plan of Care (Signed)
Problem: Education: Goal: Knowledge of condition will improve Outcome: Progressing See progress note entered by this RN.

## 2016-10-03 NOTE — Anesthesia Procedure Notes (Signed)
Epidural Patient location during procedure: OB Start time: 10/03/2016 10:48 AM End time: 10/03/2016 10:54 AM  Staffing Anesthesiologist: Cecile Hearing Performed: anesthesiologist   Preanesthetic Checklist Completed: patient identified, pre-op evaluation, timeout performed, IV checked, risks and benefits discussed and monitors and equipment checked  Epidural Patient position: sitting Prep: DuraPrep Patient monitoring: blood pressure and continuous pulse ox Approach: midline Location: L3-L4 Injection technique: LOR air  Needle:  Needle type: Tuohy  Needle gauge: 17 G Needle length: 9 cm Needle insertion depth: 6 cm Catheter size: 19 Gauge Catheter at skin depth: 11 cm Test dose: negative and Other (1% Lidocaine)  Additional Notes Patient identified.  Risk benefits discussed including failed block, incomplete pain control, headache, nerve damage, paralysis, blood pressure changes, nausea, vomiting, reactions to medication both toxic or allergic, and postpartum back pain.  Patient expressed understanding and wished to proceed.  All questions were answered.  Sterile technique used throughout procedure and epidural site dressed with sterile barrier dressing. No paresthesia or other complications noted. The patient did not experience any signs of intravascular injection such as tinnitus or metallic taste in mouth nor signs of intrathecal spread such as rapid motor block. Please see nursing notes for vital signs. Reason for block:procedure for pain

## 2016-10-03 NOTE — MAU Provider Note (Signed)
History     CSN: [id number]  Arrival date and time: 10/03/16 0116   First Provider Initiated Contact with Patient 10/03/16 0139      Chief Complaint  Patient presents with  . Vaginal Bleeding   Monica Munoz is a 26 y.o. G3P2001 at [redacted]w[redacted]d who presents today with vaginal bleeding. She states that the bleeding started around 0100. She got up to use the bathroom, and felt "a gush of something". She saw it was blood. She denies any pain or contractions. She reports normal fetal movement.    Vaginal Bleeding  The patient's primary symptoms include vaginal bleeding. The patient's pertinent negatives include no pelvic pain or vaginal discharge. This is a new problem. The current episode started today (about 0100. ). The problem has been unchanged. The pain is mild ("mostly in my back"). She is pregnant. Pertinent negatives include no chills, dysuria, fever, frequency, nausea, urgency or vomiting. The vaginal discharge was bloody. The vaginal bleeding is typical of menses. She has been passing clots (about the size of a quarter ). She has not been passing tissue. Nothing aggravates the symptoms. She has tried nothing for the symptoms. Sexual activity: Patient denies intercourse in the last 24 hours.    Past Medical History:  Diagnosis Date  . Anxiety   . Asthma   . Borderline personality disorder   . Depression   . 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 Manic depression (HCC)   . Migraine with aura 04/25/2012  . Pelvic inflammatory disease 04/25/2015    Past Surgical History:  Procedure Laterality Date  . WISDOM TOOTH EXTRACTION      Family History  Problem Relation Age of Onset  . Diabetes Maternal Grandfather   . Hyperlipidemia Maternal Grandfather   . Stroke Maternal Grandfather   . Cancer Maternal Grandfather   . Hypertension Maternal Grandfather   . Heart disease Maternal Grandfather   . Heart murmur Mother   . Miscarriages / [place] Mother   . Diabetes Mother   . Stroke  Mother   . Lupus Mother     Social History  Substance Use Topics  . Smoking status: Never Smoker  . Smokeless tobacco: Never Used  . Alcohol use No    Allergies:  Allergies  Allergen Reactions  . Benadryl [Diphenhydramine Hcl] Other (See Comments)    Reaction:  Red Man Syndrome   . Reglan [Metoclopramide] Shortness Of Breath    Chest pain, face numb and red  . Lorazepam Other (See Comments)    Pt states that this med makes her feel "loopy".   . Lorazepam Other (See Comments)  . Vancomycin Itching  . Adhesive [Tape] Itching and Dermatitis    Prescriptions Prior to Admission  Medication Sig Dispense Refill Last Dose  . Prenatal Vit-Fe Fumarate-FA (PRENATAL MULTIVITAMIN) TABS tablet Take 1 tablet by mouth at bedtime.    10/02/2016 at Unknown time  . albuterol (PROAIR HFA) 108 (90 Base) MCG/ACT inhaler Inhale 2 puffs into the lungs 4 (four) times daily as needed for wheezing or shortness of breath.    Taking  . [employer] TUSSIN DM 10-200 MG/5ML LIQD TAKE 5 MLS BY MOUTH EVERY 4 (FOUR) HOURS AS NEEDED FOR COUGH.  0 Taking  . guaiFENesin-dextromethorphan (ROBITUSSIN DM) 100-10 MG/5ML syrup Take 5 mLs by mouth every 4 (four) hours as needed for cough. 118 mL 0 Taking  . ondansetron (ZOFRAN) 4 MG tablet Take 1 tablet (4 mg total) by mouth every 6 (six) hours. 12 tablet 0 Taking  .  pseudoephedrine (SUDAFED) 30 MG tablet Take 1 tablet (30 mg total) by mouth every 4 (four) hours as needed for congestion. 30 tablet 0 Taking    Review of Systems  Constitutional: Negative for chills and fever.  Gastrointestinal: Negative for nausea and vomiting.  Genitourinary: Positive for vaginal bleeding. Negative for dysuria, frequency, pelvic pain, urgency and vaginal discharge.   Physical Exam   Blood pressure 136/81, pulse 92, temperature 98.3 F (36.8 C), resp. rate 19, height  (1.651 m), weight 168 lb (76.2 kg), last menstrual period 12/17/2015, SpO2 100 %, unknown if currently  breastfeeding.  Physical Exam  Nursing note and vitals reviewed. Constitutional: She is oriented to person, place, and time. She appears well-developed and well-nourished. No distress.  HENT:  Head: Normocephalic.  Cardiovascular: Normal rate.   Respiratory: Effort normal.  GI: Soft. There is no tenderness. There is no rebound.  Genitourinary:  Genitourinary Comments:  External: no lesion Vagina: 8 faux swabs worth of blood in the vagina.  Cervix: pink, smooth, 3.5/70/-2 Uterus: AGA   Neurological: She is alert and oriented to person, place, and time.  Skin: Skin is warm and dry.  Psychiatric: She has a normal mood and affect.   FHT: 135, moderate with 15x15 accels, no decels Toco: irregular UCs  MAU Course  Procedures  MDM DW Dr. Shawnie Pons, offer patient a version for later this morning or c-section later today.  DW the patient RBA discussed. Questions answered. She would like to try the version.  Will admit to labor and delivery. Dr. Shawnie Pons plan for version around 0630.   Assessment and Plan  Fetus in the breech presentation Vaginal bleeding at term Admit to labor and delivery for ECV later today   [name] 10/03/2016, 1:40 AM

## 2016-10-03 NOTE — Progress Notes (Signed)
Patient ID: Monica Munoz, female   DOB: December 04, 1990, 26 y.o.   MRN: [id number] After informed verbal consent, Terbutaline 0.25 mg SQ given, ECV was attempted under Ultrasound guidance.  Infant oblique with head to maternal right. Forward roll completed x 1 very easily. Vertex confirmed.Marland Kitchen   FHR was reactive before and after the procedure.   Pt. Tolerated the procedure well. Will proceed with IOL with Pitocin.

## 2016-10-03 NOTE — MAU Note (Signed)
Pt reports vaginal bleeding that started tonight. Pt also reports some mild cramping. Good fetal movement.

## 2016-10-03 NOTE — Progress Notes (Signed)
Patient ID: Monica Munoz, female   DOB: 21-Oct-1990, 26 y.o.   MRN: [id number] Monica Munoz is a 26 y.o. G3P2001 at [redacted]w[redacted]d.  Subjective: Comfortable w/ epidural.  Objective: BP 131/65   Pulse 86   Temp 98.6 F (37 C) (Oral)   Resp 16   Ht  (1.651 m)   Wt 168 lb (76.2 kg)   LMP 12/17/2015 (Approximate)   SpO2 98%   BMI 27.96 kg/m    FHT:  FHR: 125 bpm, variability: mod,  accelerations:  15x15,  decelerations:  None.  UC:   Q 2-3 minutes, moderate AROM small amount of bloody fluid  Dilation: 4 Effacement (%): 70 Cervical Position: Posterior Station: -3 Presentation: Vertex (Verified by BS [place]) Exam by:: Dorathy Kinsman, CNM  Labs: Results for orders placed or performed during the hospital encounter of 10/03/16 (from the past 24 hour(s))  CBC     Status: Abnormal   Collection Time: 10/03/16  3:03 AM  Result Value Ref Range   WBC 16.2 (H) 4.0 - 10.5 K/uL   RBC 3.38 (L) 3.87 - 5.11 MIL/uL   Hemoglobin 11.0 (L) 12.0 - 15.0 g/dL   HCT 40.9 (L) 81.1 - 91.4 %   MCV 93.8 78.0 - 100.0 fL   MCH 32.5 26.0 - 34.0 pg   MCHC 34.7 30.0 - 36.0 g/dL   RDW 78.2 95.6 - 21.3 %   Platelets 262 150 - 400 K/uL  Type and screen [facility]     Status: None   Collection Time: 10/03/16  3:03 AM  Result Value Ref Range   ABO/RH(D) A POS    Antibody Screen NEG    Sample Expiration 10/06/2016     Assessment / Plan: [redacted]w[redacted]d week IUP Labor: Early/IOL Fetal Wellbeing:  Category I Pain Control:  Epidural Anticipated MOD:  SVD Presumed small abruption. Bleeding and FHR stable. Appropriate for labor. Dr. Ashok Pall updated.   Katrinka Blazing, [place], CNM 10/03/2016 11:31 AM

## 2016-10-04 ENCOUNTER — Encounter (HOSPITAL_COMMUNITY): Payer: Self-pay | Admitting: *Deleted

## 2016-10-04 ENCOUNTER — Encounter: Payer: Medicaid Other | Admitting: Medical

## 2016-10-04 MED ORDER — FERROUS SULFATE 325 (65 FE) MG PO TABS: 325.0000 mg | ORAL_TABLET | Freq: Two times a day (BID) | ORAL | 3 refills | Status: DC

## 2016-10-04 MED ORDER — IBUPROFEN 600 MG PO TABS: 600.0000 mg | ORAL_TABLET | Freq: Four times a day (QID) | ORAL | 0 refills | Status: DC | PRN

## 2016-10-04 NOTE — Plan of Care (Signed)
Problem: Education: Goal: Knowledge of condition will improve Entered room around 1730 to do patient discharge education and give 1800 Ibuprofen. Patient asleep and FOB and grandmother requested not to wake patient since she had just fallen asleep. Provided mother with copy of [place] postnatal depression score this morning and requested her to fill it out. Reminded her this afternoon during baby's assessment; however, she has not yet filled it out. Parents were planning to be discharged with baby so this RN called OB to request order. Pediatrician decided to keep baby due to hearing heart murmur, however mother stated she would just prefer to be discharged and stay with baby in room. Instructed FOB to have patient call out when she wakes up for her paperwork.

## 2016-10-04 NOTE — Discharge Instructions (Signed)

## 2016-10-04 NOTE — Plan of Care (Signed)
Problem: Nutritional: Goal: Mothers verbalization of comfort with breastfeeding process will improve Mother states she has decided to formula feed only. Discussed with mother breast care.

## 2016-10-04 NOTE — Discharge Summary (Signed)
OB Discharge Summary     Patient Name: Monica Munoz DOB: Jun 19, 1990 MRN: [id number]  Date of admission: 10/03/2016 Delivering MD: Dorathy Kinsman   Date of discharge: 10/04/2016  Admitting diagnosis: 39 WEEKS BLEEDING Intrauterine pregnancy: [redacted]w[redacted]d     Secondary diagnosis:  Principal Problem:   Vaginal bleeding in pregnancy, third trimester Active Problems:   Breech presentation   Indication for care in labor or delivery  Additional problems:  Patient Active Problem List   Diagnosis Date Noted  . Breech presentation 10/03/2016  . Vaginal bleeding in pregnancy, third trimester 10/03/2016  . Indication for care in labor or delivery 10/03/2016  . Marijuana abuse 06/04/16  . Death of infant 04-25-2016  . Supervision of normal pregnancy, antepartum 03/16/2016  . Anxiety state, unspecified 05/26/2013  . Asthma, mild intermittent 05/26/2013  . Depression 05/26/2013  . Headache, migraine 01/29/2012       Discharge diagnosis: Term Pregnancy Delivered                                                                                                Post partum procedures:N/A  Augmentation: N/A  Complications: None  Hospital course:  Onset of Labor With Vaginal Delivery     26 y.o. yo G3P2001 at [redacted]w[redacted]d was admitted in Active Labor on 10/03/2016. Patient had an uncomplicated labor course as follows:  Membrane Rupture Time/Date: 11:14 AM ,10/03/2016   Intrapartum Procedures: Episiotomy: None [1]                                         Lacerations:  2nd degree [3]  Patient had a delivery of a Viable infant. 10/03/2016  Information for the patient's newborn:  Janella, Rogala [[id number]]  Delivery Method: Vaginal, Spontaneous Delivery (Filed from Delivery Summary)    Pateint had an uncomplicated postpartum course.  She is ambulating, tolerating a regular diet, passing flatus, and urinating well. Patient is discharged home in stable condition on 10/04/16.   Physical exam  Vitals:   10/03/16 1505 10/03/16 1657 10/03/16 2042 10/04/16 0532  BP: 119/69 122/76 (!) 117/57 117/61  Pulse: 83  79 62  Resp: Temp:  99.3 F (37.4 C) 98 F (36.7 C) 97.6 F (36.4 C)  TempSrc:  Oral Oral Oral  SpO2: 95% 98%    Weight:      Height:       General: alert, cooperative and no distress Lochia: appropriate Uterine Fundus: firm Incision: N/A DVT Evaluation: No evidence of DVT seen on physical exam. Labs: Lab Results  Component Value Date   WBC 16.2 (H) 10/03/2016   HGB 11.0 (L) 10/03/2016   HCT 31.7 (L) 10/03/2016   MCV 93.8 10/03/2016   PLT 262 10/03/2016   CMP Latest Ref Rng & Units 12/20/2015  Glucose 65 - 99 mg/dL 87  BUN 6 - 20 mg/dL 10  Creatinine 8.46 - 9.62 mg/dL 9.52  Sodium 841 - 324 mmol/L 137  Potassium 3.5 - 5.1 mmol/L 3.7  Chloride 101 - 111 mmol/L 107  CO2 22 - 32 mmol/L 22  Calcium 8.9 - 10.3 mg/dL 1.6(X)  Total Protein 6.5 - 8.1 g/dL 6.6  Total Bilirubin 0.3 - 1.2 mg/dL 0.9(U)  Alkaline Phos 38 - 126 U/L 52  AST 15 - 41 U/L 21  ALT 14 - 54 U/L 15    Discharge instruction: per After Visit Summary and "Baby and Me Booklet".  After visit meds:  Allergies as of 10/04/2016      Reactions   Benadryl [diphenhydramine Hcl] Other (See Comments)   Reaction:  Red Man Syndrome    Reglan [metoclopramide] Shortness Of Breath   Chest pain, face numb and red   Lorazepam Other (See Comments)   Pt states that this med makes her feel "loopy".    Lorazepam Other (See Comments)   Vancomycin Itching   Adhesive [tape] Itching, Dermatitis      Medication List    TAKE these medications   ferrous sulfate 325 (65 FE) MG tablet Take 1 tablet (325 mg total) by mouth 2 (two) times daily with a meal.   ibuprofen 600 MG tablet Commonly known as:  ADVIL,MOTRIN Take 1 tablet (600 mg total) by mouth every 6 (six) hours as needed for moderate pain or cramping.   prenatal multivitamin Tabs tablet Take 1 tablet by mouth at bedtime.   PROAIR HFA 108 (90  Base) MCG/ACT inhaler Generic drug:  albuterol Inhale 2 puffs into the lungs 4 (four) times daily as needed for wheezing or shortness of breath.            Discharge Care Instructions        Start     Ordered   10/04/16 0000  ferrous sulfate 325 (65 FE) MG tablet  2 times daily with meals     10/04/16 1559   10/04/16 0000  ibuprofen (ADVIL,MOTRIN) 600 MG tablet  Every 6 hours PRN     10/04/16 1559      Diet: routine diet  Activity: Advance as tolerated. Pelvic rest for 6 weeks.   Outpatient follow up:6 weeks Follow up Appt:No future appointments. Follow up Visit:No Follow-up on file.  Postpartum contraception: Undecided  Newborn Data: Live born female  Birth Weight: 8 lb 8.2 oz (3861 g) APGAR: 9, 9  Baby Feeding: Bottle Disposition:home with mother   10/04/2016 Larene Beach, DO PGY-2 Family Medicine Resident  Midwife attestation I have seen and examined this patient and agree with above documentation in the resident's note.   Rian Koon is a 26 y.o. G3P2001 s/p SVD.   Pain is well controlled.  Plan for birth control is no method.  Method of Feeding: bottle  PE:  Gen: well appearing Heart: reg rate Lungs: normal WOB Fundus firm Ext: soft, no pain, no edema  No results for input(s): HGB, HCT in the last 72 hours.   Assessment - discharge today  Plan: - postpartum care discussed - f/u clinic in 6 weeks for postpartum visit   Donette Larry, CNM 8:31 AM

## 2016-10-05 ENCOUNTER — Inpatient Hospital Stay (HOSPITAL_COMMUNITY): Admission: RE | Admit: 2016-10-05 | Payer: Medicaid Other | Source: Ambulatory Visit

## 2016-10-05 NOTE — Clinical Social Work Maternal (Signed)
CLINICAL SOCIAL WORK MATERNAL/CHILD NOTE  Patient Details  Name: Monica Munoz MRN: [id number] Date of Birth: 12/06/1990  Date:  10/05/2016  Clinical Social Worker Initiating Note:  (S) Cassie Shedlock Boyd-Gilyard Date/Time: Initiated:  10/05/16/1200     Child's Name:  Monica Munoz   Biological Parents:  Mother (FOB is Monica Munoz 11/06/92)   Need for Interpreter:  None   Reason for Referral:  Behavioral Health Issues, including SI , Current Substance Use/Substance Use During Pregnancy    Address:  [address] [place] [address]    Phone number:  [phone] (home)     Additional phone number:   Household Members/Support Persons (HM/SP):   Household Member/Support Person 1   HM/SP Name Relationship DOB or Age  HM/SP -1 Monica Munoz  son 05/27/2013  HM/SP -2 Monica Munoz FOB [birth year]/06/1992  HM/SP -3        HM/SP -4        HM/SP -5        HM/SP -6        HM/SP -7        HM/SP -8          Natural Supports (not living in the home):  Immediate Family, Parent   Professional Supports: None   Employment: Unemployed   Type of Work:     Education:  High school graduate   Homebound arranged:    Financial Resources:  Medicaid   Other Resources:      Cultural/Religious Considerations Which May Impact Care:  Per [name]'s Face Sheet, [name] is Catholic.  Strengths:  Ability to meet basic needs , Home prepared for child , Pediatrician chosen   Psychotropic Medications:         Pediatrician:    [place] area  Pediatrician List:   [place]  ([facility]. )  [place]    [place]    [place]    [place]    [place]      Pediatrician Fax Number:    Risk Factors/Current Problems:  Mental Health Concerns , Substance Use    Cognitive State:  Able to Concentrate , Insightful    Mood/Affect:  Calm , Happy , Relaxed , Interested , Comfortable    CSW Assessment: CSW met with [name] to complete an assessment for MH hx and SA  hx.  When CSW arrived, [name] was resting in bed and FOB was bonding with infant.  With [name]'s permission, CSW asked FOB to leave the room in effort to meet with [name] in private.  During the assessment, [name] was polite, forthcoming, and receptive to meeting with CSW.   CSW asked about [name]'s MH hx and [name] acknowledged a hx of anxiety, depression, and borderline personality disorder. [name] reported that [name] is not currently on any medications and has experienced little to no symptoms in over 2 years. [name] disclosed feeling sadness regarding the death of [name]'s daughter a year ago, but was not open to discussing [name]'s experience.  CSW offered [name]'s resources for grief and loss and [name] declined.  [name] stated " Having my family as my support has been a huge help, and I just don't feel comfortable taking to strangers about my problems",  CSW was understanding and validated [name]'s thoughts and feelings.  CSW provided education regarding Baby Blues vs PMADs.  CSW encouraged [name] to evaluate her mental health throughout the postpartum period with the use of the New Mom Checklist developed by Postpartum Progress and notify a medical professional if   symptoms arise.  [name] acknowledged PPD signs and symptoms with [name]'s 2 older children and reported symptoms subsided without interventions.   CSW inquired about [name]'s SA hx.  [name] stated that [name] smoked marijuana during pregnancy in effort to increase [name]'s appetite and to decrease [name]'s nausea.  [name] stated [name]'s last use was March 2018.  [name] acknowledged the use of opiates and benzo's prior to [name]'s pregnancy confirmation. CSW informed [name] of the hospital's drug screen policy. CSW was made aware of the 2 drug screenings for the infant.  [name] was understanding and did not have any concerns.  CSW shared with [name] that the infant had a negative UDS, and CSW will monitor the infant's cord and will make a report to [place] CPS if warranted. CSW offered [name] resources and referrals for  substance interventions and [name] declined.  [name] did not have any questions about the hospital's policy and denied CPS hx.   CSW thanked [name] for meeting with CSW and provided [name] with CSW's contact information.   CSW Plan/Description:  Information/Referral to Community Resources , Patient/Family Education , No Further Intervention Required/No Barriers to Discharge (CSW will monitor infant's CDS and will make a report if warranted. )   Trinity Hyland Boyd-Gilyard, MSW, LCSW Clinical Social Work [phone]   Georg Ang D BOYD-GILYARD, LCSW 10/05/2016, 12:10 PM  

## 2016-10-05 NOTE — Discharge Summary (Signed)
OB Discharge Summary     Patient Name: Monica Munoz DOB: 07-11-1990 MRN: [id number]  Date of admission: 10/03/2016 Delivering MD: Dorathy Kinsman   Date of discharge: 10/05/2016  Admitting diagnosis: 39 WEEKS BLEEDING Intrauterine pregnancy: [redacted]w[redacted]d     Secondary diagnosis:  Principal Problem:   Vaginal bleeding in pregnancy, third trimester Active Problems:   Breech presentation   Indication for care in labor or delivery  Additional problems:  Patient Active Problem List   Diagnosis Date Noted  . Breech presentation 10/03/2016  . Vaginal bleeding in pregnancy, third trimester 10/03/2016  . Indication for care in labor or delivery 10/03/2016  . Marijuana abuse 06-18-2016  . Death of infant 05/09/16  . Supervision of normal pregnancy, antepartum 03/16/2016  . Anxiety state, unspecified 05/26/2013  . Asthma, mild intermittent 05/26/2013  . Depression 05/26/2013  . Headache, migraine 01/29/2012       Discharge diagnosis: Term Pregnancy Delivered                                                                                                Post partum procedures:N/A  Augmentation: AROM and Pitocin  Complications: None  Hospital course:  Onset of Labor With Vaginal Delivery     26 y.o. yo G3P2001 at [redacted]w[redacted]d was admitted in Latent Labor on 10/03/2016. She was noted to be in breech presentation and was offered an ECV. This was successful and the patient went on to an uncomplicated labor course as follows:  Membrane Rupture Time/Date: 11:14 AM ,10/03/2016   Intrapartum Procedures: Episiotomy: None [1]                                         Lacerations:  2nd degree [3]  Patient had a delivery of a Viable infant. 10/03/2016  Information for the patient's newborn:  Monica Munoz, Monica Munoz [[id number]]  Delivery Method: Vaginal, Spontaneous Delivery (Filed from Delivery Summary)    Pateint had an uncomplicated postpartum course.  She is ambulating, tolerating a regular diet, passing  flatus, and urinating well. Patient is discharged home in stable condition on 10/05/16. She originally wanted to discharge on 9/19, but changed her mind in the evening to continue routine couplet care.   Physical exam  Vitals:   10/03/16 1657 10/03/16 2042 10/04/16 0532 10/05/16 0500  BP: 122/76 (!) 117/57 117/61 116/67  Pulse:  79 62 (!) 51  Resp:  Temp: 99.3 F (37.4 C) 98 F (36.7 C) 97.6 F (36.4 C) 98 F (36.7 C)  TempSrc: Oral Oral Oral Oral  SpO2: 98%     Weight:      Height:       General: alert, cooperative and no distress Lochia: appropriate Uterine Fundus: firm Incision: N/A DVT Evaluation: No evidence of DVT seen on physical exam. Labs: Lab Results  Component Value Date   WBC 16.2 (H) 10/03/2016   HGB 11.0 (L) 10/03/2016   HCT 31.7 (L) 10/03/2016   MCV 93.8 10/03/2016   PLT 262  10/03/2016   CMP Latest Ref Rng & Units 12/20/2015  Glucose 65 - 99 mg/dL 87  BUN 6 - 20 mg/dL 10  Creatinine 1.61 - 0.96 mg/dL 0.45  Sodium 409 - 811 mmol/L 137  Potassium 3.5 - 5.1 mmol/L 3.7  Chloride 101 - 111 mmol/L 107  CO2 22 - 32 mmol/L 22  Calcium 8.9 - 10.3 mg/dL 9.1(Y)  Total Protein 6.5 - 8.1 g/dL 6.6  Total Bilirubin 0.3 - 1.2 mg/dL 7.8(G)  Alkaline Phos 38 - 126 U/L 52  AST 15 - 41 U/L 21  ALT 14 - 54 U/L 15    Discharge instruction: per After Visit Summary and "Baby and Me Booklet".  After visit meds:  Allergies as of 10/05/2016      Reactions   Benadryl [diphenhydramine Hcl] Other (See Comments)   Reaction:  Red Man Syndrome    Reglan [metoclopramide] Shortness Of Breath   Chest pain, face numb and red   Lorazepam Other (See Comments)   Pt states that this med makes her feel "loopy".    Lorazepam Other (See Comments)   Vancomycin Itching   Adhesive [tape] Itching, Dermatitis      Medication List    TAKE these medications   ferrous sulfate 325 (65 FE) MG tablet Take 1 tablet (325 mg total) by mouth 2 (two) times daily with a meal.    ibuprofen 600 MG tablet Commonly known as:  ADVIL,MOTRIN Take 1 tablet (600 mg total) by mouth every 6 (six) hours as needed for moderate pain or cramping.   prenatal multivitamin Tabs tablet Take 1 tablet by mouth at bedtime.   PROAIR HFA 108 (90 Base) MCG/ACT inhaler Generic drug:  albuterol Inhale 2 puffs into the lungs 4 (four) times daily as needed for wheezing or shortness of breath.            Discharge Care Instructions        Start     Ordered   10/04/16 0000  ferrous sulfate 325 (65 FE) MG tablet  2 times daily with meals     10/04/16 1559   10/04/16 0000  ibuprofen (ADVIL,MOTRIN) 600 MG tablet  Every 6 hours PRN     10/04/16 1559      Diet: routine diet  Activity: Advance as tolerated. Pelvic rest for 6 weeks.   Outpatient follow up:6 weeks Follow up Appt:No future appointments. Follow up Visit:No Follow-up on file.  Postpartum contraception: Undecided  Newborn Data: Live born female  Birth Weight: 8 lb 8.2 oz (3861 g) APGAR: 9, 9  Baby Feeding: Bottle Disposition:home with mother   10/05/2016 Burnard Leigh, MD PGY-2 Family Medicine Resident  CNM attestation I have seen and examined this patient and agree with above documentation in the resident's note.   Monica Munoz is a 26 y.o. G3P2001 s/p SVD.   Pain is well controlled.  Plan for birth control is no method.  Method of Feeding: bottle  PE:  BP 116/67 (BP Location: Right Arm)   Pulse (!) 51   Temp 98 F (36.7 C) (Oral)   Resp 18   Ht  (1.651 m)   Wt 76.2 kg (168 lb)   LMP 12/17/2015 (Approximate)   SpO2 98%   Breastfeeding? Unknown   BMI 27.96 kg/m  Fundus firm   Recent Labs  10/03/16 0303  HGB 11.0*  HCT 31.7*     Plan: discharge today - postpartum care discussed - f/u clinic in 4 weeks for postpartum visit  Cam Hai, CNM 9:54 AM 10/05/2016

## 2016-10-05 NOTE — Progress Notes (Signed)
Social work notified that there isn't a note on this patient. Patient was seen yesterday per [facility] by angel.

## 2016-10-09 ENCOUNTER — Telehealth: Payer: Self-pay | Admitting: General Practice

## 2016-10-09 NOTE — Telephone Encounter (Signed)
Patient has an Postpartum appointment on 11/14/16 at 1:20pm with Venia Carbon, CNM.  Mailed Paediatric nurse.

## 2016-10-11 ENCOUNTER — Encounter: Payer: Medicaid Other | Admitting: Advanced Practice Midwife

## 2016-11-14 ENCOUNTER — Ambulatory Visit: Payer: Medicaid Other | Admitting: Obstetrics and Gynecology

## 2016-11-29 ENCOUNTER — Ambulatory Visit (INDEPENDENT_AMBULATORY_CARE_PROVIDER_SITE_OTHER): Payer: Medicaid Other | Admitting: Advanced Practice Midwife

## 2016-11-29 ENCOUNTER — Encounter: Payer: Self-pay | Admitting: Advanced Practice Midwife

## 2016-11-29 DIAGNOSIS — Z1389 Encounter for screening for other disorder: Secondary | ICD-10-CM

## 2016-11-29 DIAGNOSIS — J301 Allergic rhinitis due to pollen: Secondary | ICD-10-CM | POA: Insufficient documentation

## 2016-11-29 DIAGNOSIS — Z8669 Personal history of other diseases of the nervous system and sense organs: Secondary | ICD-10-CM | POA: Insufficient documentation

## 2016-11-29 LAB — POCT URINALYSIS DIP (DEVICE)
BILIRUBIN URINE: NEGATIVE
Glucose, UA: NEGATIVE mg/dL
KETONES UR: NEGATIVE mg/dL
Leukocytes, UA: NEGATIVE
Nitrite: NEGATIVE
PH: 6.5 (ref 5.0–8.0)
Protein, ur: NEGATIVE mg/dL
Specific Gravity, Urine: 1.025 (ref 1.005–1.030)
Urobilinogen, UA: 0.2 mg/dL (ref 0.0–1.0)

## 2016-11-29 NOTE — Patient Instructions (Signed)

## 2016-11-29 NOTE — Progress Notes (Signed)
Subjective:     Monica Munoz is a 26 y.o. female who presents for a postpartum visit. She is 8 weeks postpartum following a spontaneous vaginal delivery. I have fully reviewed the prenatal and intrapartum course. The delivery was at term gestational weeks. Outcome: spontaneous vaginal delivery. Anesthesia: regional and epidural. Postpartum course has been uneventful. Baby's course has been uneventful. Baby is feeding by bottle - Carnation Good Start DHA and ARA and gerber. Bleeding no bleeding. Bowel function is normal. Bladder function is normal. Patient is sexually active. Contraception method is none. Postpartum depression screening: negative.  The following portions of the patient's history were reviewed and updated as appropriate: allergies, current medications, past family history, past medical history, past social history, past surgical history and problem list.  Review of Systems Pertinent items are noted in HPI.   Objective:    There were no vitals taken for this visit.  General:  alert, cooperative and no distress   Breasts:  inspection negative, no nipple discharge or bleeding, no masses or nodularity palpable  Lungs: clear to auscultation bilaterally  Heart:  regular rate and rhythm, S1, S2 normal, no murmur, click, rub or gallop  Abdomen: soft, non-tender; bowel sounds normal; no masses,  no organomegaly   Vulva:  not evaluated  Vagina: not evaluated  Cervix:  multiparous appearance  Corpus: not examined  Adnexa:  not evaluated  Rectal Exam: Not performed.        Assessment:     normal postpartum exam. Pap smear not done at today's visit.   Plan:    1. Contraception: Undecided. Wants to know about all kinds. 2. Long review of types of contraception.  Advised condoms and spermacide until she decides.  Did have one episode of closely space pregnancy 3. Follow up in: undetermined weeks or as needed.

## 2017-01-30 ENCOUNTER — Other Ambulatory Visit: Payer: Self-pay

## 2017-01-30 ENCOUNTER — Encounter (HOSPITAL_COMMUNITY): Payer: Self-pay | Admitting: Emergency Medicine

## 2017-01-30 ENCOUNTER — Ambulatory Visit (HOSPITAL_COMMUNITY)
Admission: EM | Admit: 2017-01-30 | Discharge: 2017-01-30 | Disposition: A | Discharge status: Home / Self Care | Payer: Medicaid Other | Attending: Family Medicine | Admitting: Family Medicine

## 2017-01-30 DIAGNOSIS — J00 Acute nasopharyngitis [common cold]: Secondary | ICD-10-CM | POA: Diagnosis not present

## 2017-01-30 MED ORDER — GUAIFENESIN ER 600 MG PO TB12: 600.0000 mg | ORAL_TABLET | Freq: Two times a day (BID) | ORAL | 0 refills | Status: DC

## 2017-01-30 MED ORDER — CROMOLYN SODIUM 5.2 MG/ACT NA AERS: 1.0000 | INHALATION_SPRAY | Freq: Four times a day (QID) | NASAL | 12 refills | Status: DC

## 2017-01-30 NOTE — Discharge Instructions (Signed)
Tylenol and/or ibuprofen as needed for pain or fevers.  Push fluids to ensure adequate hydration and keep secretions thin.  Cromolyn nasal spray may help with congestion symptoms. Mucinex twice a day. If symptoms worsen or do not improve in the next week to return to be seen or to follow up with your PCP.

## 2017-01-30 NOTE — ED Triage Notes (Addendum)
2 day history of symptoms.  Complains of nausea, sore throat, drainage in throat.  Patient is also concerned for lower back discomfort since having her son 4 months ago, reports a bulge

## 2017-01-30 NOTE — ED Provider Notes (Signed)
[facility]    CSN: [id number] Arrival date & time: 01/30/17  1609     History   Chief Complaint Chief Complaint  Patient presents with  . URI    HPI Monica Munoz is a 27 y.o. female.   Monica Munoz presents with complaints of nasal congestion, post nasal drip, sore throat and non productive cough which started two days ago. Significant other had similar symptoms prior to symptoms starting. Without fevers. Denies ear pain. Mild nausea and decreased appetite. Without rash. Took tussin DM which did not seem to help with symptoms. Rates pain 5/10.   ROS per HPI.       Past Medical History:  Diagnosis Date  . Anxiety   . Asthma   . Borderline personality disorder (HCC)   . Depression   . 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 Manic depression (HCC)   . Migraine with aura 04/25/2012  . Pelvic inflammatory disease 04/25/2015    Patient Active Problem List   Diagnosis Date Noted  . Allergic rhinitis due to pollen 11/29/2016  . History of migraine 11/29/2016  . Marijuana abuse 05/23/2016  . Death of infant 11-21-16  . Anxiety 05/26/2013  . Asthma, mild intermittent 05/26/2013  . Depression 05/26/2013  . Asthma 01/29/2012  . Headache, migraine 01/29/2012    Past Surgical History:  Procedure Laterality Date  . WISDOM TOOTH EXTRACTION      OB History    Gravida Para Term Preterm AB Living   3 2 2  0 0 1   SAB TAB Ectopic Multiple Live Births   0 0 0 0 2      Obstetric Comments   Deceased at 11 months from cyst in throat.        Home Medications    Prior to Admission medications   Medication Sig Start Date End Date Taking? Authorizing Provider  albuterol (PROAIR HFA) 108 (90 Base) MCG/ACT inhaler Inhale 2 puffs into the lungs 4 (four) times daily as needed for wheezing or shortness of breath.     [provider]  cromolyn (NASALCROM) 5.2 MG/ACT nasal spray Place 1 spray into both nostrils 4 (four) times daily. 01/30/17   Georgetta HaberBurky, [place] B, NP    ferrous sulfate 325 (65 FE) MG tablet Take 1 tablet (325 mg total) by mouth 2 (two) times daily with a meal. 10/04/16   Key, Jearld LeschMary K, DO  guaiFENesin (MUCINEX) 600 MG 12 hr tablet Take 1 tablet (600 mg total) by mouth 2 (two) times daily. 01/30/17   Georgetta HaberBurky, Cou[place] J[place]aj B, NP  ibuprofen (ADVIL,MOTRIN) 600 MG tablet Take 1 tablet (600 mg total) by mouth every 6 (six) hours as needed for moderate pain or cramping. 10/04/16   Key, Jearld LeschMary K, DO  Prenatal Vit-Fe Fumarate-FA (PRENATAL MULTIVITAMIN) TABS tablet Take 1 tablet by mouth at bedtime.     [provider]    Family History Family History  Problem Relation Age of Onset  . Diabetes Maternal Grandfather   . Hyperlipidemia Maternal Grandfather   . Stroke Maternal Grandfather   . Cancer Maternal Grandfather   . Hypertension Maternal Grandfather   . Heart disease Maternal Grandfather   . Heart murmur Mother   . Miscarriages / [place] Mother   . Diabetes Mother   . Stroke Mother   . Lupus Mother     Social History Social History   Tobacco Use  . Smoking status: Never Smoker  . Smokeless tobacco: Never Used  Substance Use Topics  . Alcohol use: No  .  Drug use: No     Allergies   Benadryl [diphenhydramine hcl]; Reglan [metoclopramide]; Lorazepam; Lorazepam; Vancomycin; and Adhesive [tape]   Review of Systems Review of Systems   Physical Exam Triage Vital Signs ED Triage Vitals  Enc Vitals Group     BP 01/30/17 1631 129/78     Pulse Rate 01/30/17 1631 93     Resp 01/30/17 1631 18     Temp 01/30/17 1631 98.8 F (37.1 C)     Temp Source 01/30/17 1631 Oral     SpO2 01/30/17 1631 99 %     Weight --      Height --      Head Circumference --      Peak Flow --      Pain Score 01/30/17 1629 5     Pain Loc --      Pain Edu? --      Excl. in GC? --    No data found.  Updated Vital Signs BP 129/78 (BP Location: Left Arm)   Pulse 93   Temp 98.8 F (37.1 C) (Oral)   Resp 18   SpO2 99%   Visual Acuity Right Eye  Distance:   Left Eye Distance:   Bilateral Distance:    Right Eye Near:   Left Eye Near:    Bilateral Near:     Physical Exam  Constitutional: She is oriented to person, place, and time. She appears well-developed and well-nourished. No distress.  HENT:  Head: Normocephalic and atraumatic.  Right Ear: Tympanic membrane, external ear and ear canal normal.  Left Ear: Tympanic membrane, external ear and ear canal normal.  Nose: Rhinorrhea present. Right sinus exhibits no maxillary sinus tenderness and no frontal sinus tenderness. Left sinus exhibits no maxillary sinus tenderness and no frontal sinus tenderness.  Mouth/Throat: Uvula is midline, oropharynx is clear and moist and mucous membranes are normal. No tonsillar exudate.  Eyes: Conjunctivae and EOM are normal. Pupils are equal, round, and reactive to light.  Cardiovascular: Normal rate, regular rhythm and normal heart sounds.  Pulmonary/Chest: Effort normal and breath sounds normal.  Neurological: She is alert and oriented to person, place, and time.  Skin: Skin is warm and dry.     UC Treatments / Results  Labs (all labs ordered are listed, but only abnormal results are displayed) Labs Reviewed - No data to display  EKG  EKG Interpretation None       Radiology No results found.  Procedures Procedures (including critical care time)  Medications Ordered in UC Medications - No data to display   Initial Impression / Assessment and Plan / UC Course  I have reviewed the triage vital signs and the nursing notes.  Pertinent labs & imaging results that were available during my care of the patient were reviewed by me and considered in my medical decision making (see chart for details).     Afebrile. Non toxic in appearance. Benign physical findings. History and physical exam consistent with viral illness. Supportive cares recommended. If symptoms worsen or do not improve in the next week to return to be seen or to  follow up with PCP.  Patient verbalized understanding and agreeable to plan.    Final Clinical Impressions(s) / UC Diagnoses   Final diagnoses:  Acute nasopharyngitis    ED Discharge Orders        Ordered    cromolyn (NASALCROM) 5.2 MG/ACT nasal spray  4 times daily     01/30/17 1647  guaiFENesin (MUCINEX) 600 MG 12 hr tablet  2 times daily     01/30/17 1647       Controlled Substance Prescriptions [place] Controlled Substance Registry consulted? Not Applicable   Georgetta Haber, NP 01/30/17 1654

## 2019-07-14 LAB — OB RESULTS CONSOLE RPR: RPR: NONREACTIVE

## 2019-07-14 LAB — OB RESULTS CONSOLE HEPATITIS B SURFACE ANTIGEN: Hepatitis B Surface Ag: NEGATIVE

## 2019-07-14 LAB — OB RESULTS CONSOLE RUBELLA ANTIBODY, IGM: Rubella: IMMUNE

## 2019-07-14 LAB — OB RESULTS CONSOLE HIV ANTIBODY (ROUTINE TESTING): HIV: NONREACTIVE

## 2019-07-14 LAB — OB RESULTS CONSOLE GC/CHLAMYDIA
Chlamydia: NEGATIVE
Gonorrhea: NEGATIVE

## 2019-07-14 LAB — OB RESULTS CONSOLE ABO/RH: RH Type: POSITIVE

## 2019-07-14 LAB — OB RESULTS CONSOLE ANTIBODY SCREEN: Antibody Screen: NEGATIVE

## 2019-10-22 ENCOUNTER — Inpatient Hospital Stay (HOSPITAL_COMMUNITY)
Admission: AD | Admit: 2019-10-22 | Discharge: 2019-10-22 | Disposition: A | Discharge status: Home / Self Care | Payer: Medicaid Other | Attending: Obstetrics & Gynecology | Admitting: Obstetrics & Gynecology

## 2019-10-22 ENCOUNTER — Other Ambulatory Visit: Payer: Self-pay

## 2019-10-22 ENCOUNTER — Encounter (HOSPITAL_COMMUNITY): Payer: Self-pay | Admitting: Obstetrics & Gynecology

## 2019-10-22 DIAGNOSIS — Z3A21 21 weeks gestation of pregnancy: Secondary | ICD-10-CM | POA: Insufficient documentation

## 2019-10-22 DIAGNOSIS — R109 Unspecified abdominal pain: Secondary | ICD-10-CM | POA: Insufficient documentation

## 2019-10-22 DIAGNOSIS — O26892 Other specified pregnancy related conditions, second trimester: Secondary | ICD-10-CM | POA: Insufficient documentation

## 2019-10-22 DIAGNOSIS — R519 Headache, unspecified: Secondary | ICD-10-CM | POA: Insufficient documentation

## 2019-10-22 DIAGNOSIS — Z8669 Personal history of other diseases of the nervous system and sense organs: Secondary | ICD-10-CM | POA: Insufficient documentation

## 2019-10-22 DIAGNOSIS — Z79899 Other long term (current) drug therapy: Secondary | ICD-10-CM | POA: Insufficient documentation

## 2019-10-22 DIAGNOSIS — Z888 Allergy status to other drugs, medicaments and biological substances status: Secondary | ICD-10-CM | POA: Insufficient documentation

## 2019-10-22 LAB — CBC
HCT: 34.7 % — ABNORMAL LOW (ref 36.0–46.0)
Hemoglobin: 11.4 g/dL — ABNORMAL LOW (ref 12.0–15.0)
MCH: 32 pg (ref 26.0–34.0)
MCHC: 32.9 g/dL (ref 30.0–36.0)
MCV: 97.5 fL (ref 80.0–100.0)
Platelets: 244 10*3/uL (ref 150–400)
RBC: 3.56 MIL/uL — ABNORMAL LOW (ref 3.87–5.11)
RDW: 14.6 % (ref 11.5–15.5)
WBC: 14.2 10*3/uL — ABNORMAL HIGH (ref 4.0–10.5)
nRBC: 0 % (ref 0.0–0.2)

## 2019-10-22 LAB — RAPID URINE DRUG SCREEN, HOSP PERFORMED
Amphetamines: NOT DETECTED
Barbiturates: NOT DETECTED
Benzodiazepines: NOT DETECTED
Cocaine: NOT DETECTED
Opiates: NOT DETECTED
Tetrahydrocannabinol: POSITIVE — AB

## 2019-10-22 LAB — BASIC METABOLIC PANEL
Anion gap: 8 (ref 5–15)
BUN: 7 mg/dL (ref 6–20)
CO2: 21 mmol/L — ABNORMAL LOW (ref 22–32)
Calcium: 8.3 mg/dL — ABNORMAL LOW (ref 8.9–10.3)
Chloride: 104 mmol/L (ref 98–111)
Creatinine, Ser: 0.56 mg/dL (ref 0.44–1.00)
GFR calc non Af Amer: >60 mL/min (ref >60)
Glucose, Bld: 78 mg/dL (ref 70–99)
Potassium: 3.7 mmol/L (ref 3.5–5.1)
Sodium: 133 mmol/L — ABNORMAL LOW (ref 135–145)

## 2019-10-22 LAB — URINALYSIS, ROUTINE W REFLEX MICROSCOPIC
Bilirubin Urine: NEGATIVE
Glucose, UA: NEGATIVE mg/dL
Hgb urine dipstick: NEGATIVE
Ketones, ur: NEGATIVE mg/dL
Nitrite: NEGATIVE
Protein, ur: NEGATIVE mg/dL
Specific Gravity, Urine: 1.012 (ref 1.005–1.030)
pH: 6 (ref 5.0–8.0)

## 2019-10-22 MED ORDER — PROMETHAZINE HCL 12.5 MG PO TABS: 12.5000 mg | ORAL_TABLET | Freq: Four times a day (QID) | ORAL | 0 refills | Status: DC | PRN

## 2019-10-22 MED ORDER — DEXAMETHASONE SODIUM PHOSPHATE 10 MG/ML IJ SOLN
10.0000 mg | Freq: Once | INTRAMUSCULAR | Status: AC
  Administered 2019-10-22: 20:00:00 10 mg via INTRAVENOUS
  Filled 2019-10-22: qty 1

## 2019-10-22 MED ORDER — LACTATED RINGERS IV SOLN: Freq: Once | INTRAVENOUS | Status: AC

## 2019-10-22 MED ORDER — PROMETHAZINE HCL 25 MG/ML IJ SOLN
25.0000 mg | Freq: Once | INTRAMUSCULAR | Status: AC
  Administered 2019-10-22: 20:00:00 25 mg via INTRAVENOUS
  Filled 2019-10-22: qty 1

## 2019-10-22 NOTE — MAU Note (Signed)
For the last 2 days has been having a really bad migraine( has not taken anything for it), since that started has had a really bad dull achy pain on left side (upper lateral-below rib area, axillary line).  no care since moved from [place] at 12 wks.

## 2019-10-22 NOTE — MAU Provider Note (Addendum)
History     CSN: [id number]  Arrival date and time: 10/22/19 1757   First Provider Initiated Contact with Patient 10/22/19 1840      Chief Complaint  Patient presents with  . Abdominal Pain  . Headache   HPI Monica Munoz is a 29 y.o. G4P2001 at [redacted]w[redacted]d who presents to MAU for evaluation of migraine and left abdominal pain over her side, not her uterus. She endorses extensive history of migraines. She does not take medication or try other treatments for her migraines. She states prior to pregnancy she attempted management with PO Tylenol but it never provided relief so she does not take it now.  She also c/o left-sided torso pain, onset coinciding with the onset of her migraines. Her pain is located directly along the mid-axillary line. She denies dysuria, CVAT, tenderness, fever.  Patient endorses co-sleeping with her 27 year old. She acknowledges concern that her 27 year old may be tossing and turning, inadvertently hitting her abdomen.   Patient denies lower abdominal pain, vaginal bleeding, fever or recent illness. Patient recently returned to the area from [place] and has not had a prenatal visit since [redacted] weeks GA. She has not yet selected a [facility] Provider.  OB History    Gravida  6   Para  5   Term  5   Preterm  0   AB  0   Living  4     SAB  0   TAB  0   Ectopic  0   Multiple  0   Live Births  5        Obstetric Comments  Deceased at 11 months from cyst in throat.         Past Medical History:  Diagnosis Date  . Anxiety   . Asthma   . Borderline personality disorder (HCC)   . Depression   . 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 Manic depression (HCC)   . Migraine with aura 04/25/2012  . Pelvic inflammatory disease 04/25/2015    Past Surgical History:  Procedure Laterality Date  . WISDOM TOOTH EXTRACTION      Family History  Problem Relation Age of Onset  . Diabetes Maternal Grandfather   . Hyperlipidemia Maternal Grandfather   . Stroke Maternal  Grandfather   . Cancer Maternal Grandfather   . Hypertension Maternal Grandfather   . Heart disease Maternal Grandfather   . Heart murmur Mother   . Miscarriages / [place] Mother   . Diabetes Mother   . Stroke Mother   . Lupus Mother     Social History   Tobacco Use  . Smoking status: Never Smoker  . Smokeless tobacco: Never Used  Substance Use Topics  . Alcohol use: No  . Drug use: No    Allergies:  Allergies  Allergen Reactions  . Benadryl [Diphenhydramine Hcl] Other (See Comments)    Reaction:  Red Man Syndrome   . Reglan [Metoclopramide] Shortness Of Breath    Chest pain, face numb and red  . Lorazepam Other (See Comments)    Pt states that this med makes her feel "loopy".   . Lorazepam Other (See Comments)  . Vancomycin Itching  . Adhesive [Tape] Itching and Dermatitis    Medications Prior to Admission  Medication Sig Dispense Refill Last Dose  . albuterol (PROAIR HFA) 108 (90 Base) MCG/ACT inhaler Inhale 2 puffs into the lungs 4 (four) times daily as needed for wheezing or shortness of breath.      Marland Kitchen  cromolyn (NASALCROM) 5.2 MG/ACT nasal spray Place 1 spray into both nostrils 4 (four) times daily. 26 mL 12   . ferrous sulfate 325 (65 FE) MG tablet Take 1 tablet (325 mg total) by mouth 2 (two) times daily with a meal. 60 tablet 3   . guaiFENesin (MUCINEX) 600 MG 12 hr tablet Take 1 tablet (600 mg total) by mouth 2 (two) times daily. 20 tablet 0   . ibuprofen (ADVIL,MOTRIN) 600 MG tablet Take 1 tablet (600 mg total) by mouth every 6 (six) hours as needed for moderate pain or cramping. 30 tablet 0   . Prenatal Vit-Fe Fumarate-FA (PRENATAL MULTIVITAMIN) TABS tablet Take 1 tablet by mouth at bedtime.        Review of Systems  Gastrointestinal: Positive for abdominal pain.       Mid-axillary line  Neurological: Positive for headaches.  All other systems reviewed and are negative.  Physical Exam   Blood pressure 104/64, pulse 92, temperature 98.8 F (37.1 C),  temperature source Oral, resp. rate 16, height 5\' 5"  (1.651 m), weight 70.7 kg, SpO2 97 %, unknown if currently breastfeeding.  Patient Vitals for the past 24 hrs:  BP Temp Temp src Pulse Resp SpO2 Height Weight  10/22/19 2029 104/64 -- -- 92 -- 97 % -- --  10/22/19 1908 111/61 -- -- 89 -- -- -- --  10/22/19 1814 106/71 98.8 F (37.1 C) Oral (!) 109 16 99 % 5\' 5"  (1.651 m) 70.7 kg   Physical Exam Vitals and nursing note reviewed. Exam conducted with a chaperone present.  Constitutional:      Appearance: She is well-developed. She is not ill-appearing or toxic-appearing.     Comments: Flat affect  Cardiovascular:     Rate and Rhythm: Tachycardia present.     Heart sounds: Normal heart sounds.  Pulmonary:     Effort: Pulmonary effort is normal.     Breath sounds: Normal breath sounds.  Abdominal:     Palpations: Abdomen is soft.     Tenderness: There is no abdominal tenderness. There is no right CVA tenderness, left CVA tenderness or rebound.  Skin:    General: Skin is warm and dry.  Neurological:     General: No focal deficit present.  Psychiatric:        Mood and Affect: Mood normal.        Behavior: Behavior normal.    Results for orders placed or performed during the hospital encounter of 10/22/19 (from the past 24 hour(s))  Urinalysis, Routine w reflex microscopic Urine, Clean Catch     Status: Abnormal   Collection Time: 10/22/19  6:20 PM  Result Value Ref Range   Color, Urine YELLOW YELLOW   APPearance HAZY (A) CLEAR   Specific Gravity, Urine 1.012 1.005 - 1.030   pH 6.0 5.0 - 8.0   Glucose, UA NEGATIVE NEGATIVE mg/dL   Hgb urine dipstick NEGATIVE NEGATIVE   Bilirubin Urine NEGATIVE NEGATIVE   Ketones, ur NEGATIVE NEGATIVE mg/dL   Protein, ur NEGATIVE NEGATIVE mg/dL   Nitrite NEGATIVE NEGATIVE   Leukocytes,Ua MODERATE (A) NEGATIVE   RBC / HPF 0-5 0 - 5 RBC/hpf   WBC, UA 11-20 0 - 5 WBC/hpf   Bacteria, UA MANY (A) NONE SEEN   Squamous Epithelial / LPF 11-20 0 - 5    Mucus PRESENT   Rapid urine drug screen (hospital performed)     Status: Abnormal   Collection Time: 10/22/19  6:48 PM  Result Value Ref Range  Opiates NONE DETECTED NONE DETECTED   Cocaine NONE DETECTED NONE DETECTED   Benzodiazepines NONE DETECTED NONE DETECTED   Amphetamines NONE DETECTED NONE DETECTED   Tetrahydrocannabinol POSITIVE (A) NONE DETECTED   Barbiturates NONE DETECTED NONE DETECTED  CBC     Status: Abnormal   Collection Time: 10/22/19  6:55 PM  Result Value Ref Range   WBC 14.2 (H) 4.0 - 10.5 K/uL   RBC 3.56 (L) 3.87 - 5.11 MIL/uL   Hemoglobin 11.4 (L) 12.0 - 15.0 g/dL   HCT 16.1 (L) 36 - 46 %   MCV 97.5 80.0 - 100.0 fL   MCH 32.0 26.0 - 34.0 pg   MCHC 32.9 30.0 - 36.0 g/dL   RDW 09.6 04.5 - 40.9 %   Platelets 244 150 - 400 K/uL   nRBC 0.0 0.0 - 0.2 %  Basic metabolic panel     Status: Abnormal   Collection Time: 10/22/19  6:55 PM  Result Value Ref Range   Sodium 133 (L) 135 - 145 mmol/L   Potassium 3.7 3.5 - 5.1 mmol/L   Chloride 104 98 - 111 mmol/L   CO2 21 (L) 22 - 32 mmol/L   Glucose, Bld 78 70 - 99 mg/dL   BUN 7 6 - 20 mg/dL   Creatinine, Ser 8.11 0.44 - 1.00 mg/dL   Calcium 8.3 (L) 8.9 - 10.3 mg/dL   GFR calc non Af Amer >60 >60 mL/min   Anion gap 8 5 - 15    MAU Course  Procedures  --Discussed headache cocktail, adapted to accommodate patient's allergies to Reglan and Benadryl  Meds ordered this encounter  Medications  . lactated ringers infusion  . dexamethasone (DECADRON) injection 10 mg  . promethazine (PHENERGAN) injection 25 mg  . DISCONTD: promethazine (PHENERGAN) 12.5 MG tablet    Sig: Take 1 tablet (12.5 mg total) by mouth every 6 (six) hours as needed for nausea or vomiting.    Dispense:  30 tablet    Refill:  0    Order Specific Question:   Supervising Provider    Answer:   Reva Bores [[id number]]  . promethazine (PHENERGAN) 12.5 MG tablet    Sig: Take 1 tablet (12.5 mg total) by mouth every 6 (six) hours as needed for nausea or  vomiting.    Dispense:  30 tablet    Refill:  0    Order Specific Question:   Supervising Provider    Answer:   Reva Bores [[id number]]    Orders Placed This Encounter  Procedures  . Culture, OB Urine  . Urinalysis, Routine w reflex microscopic Urine, Clean Catch  . CBC  . Basic metabolic panel  . Rapid urine drug screen (hospital performed)  . AMB referral to headache clinic  . Discharge patient   Patient Vitals for the past 24 hrs:  BP Temp Temp src Pulse Resp SpO2 Height Weight  10/22/19 2029 104/64 -- -- 92 -- 97 % -- --  10/22/19 1908 111/61 -- -- 89 -- -- -- --  10/22/19 1814 106/71 98.8 F (37.1 C) Oral (!) 109 16 99 % 5\' 5"  (1.651 m) 70.7 kg   Report given to N. Shaniquia Brafford, NP who assumes care of patient at this time  , MSN, CNM Certified Nurse Midwife, [facility] 10/22/19 7:30 PM   -reevaluation of patient at 830PM -FHR 142 -pt reports HA has resolved, and abdominal pain has lessened slightly -patient able to  eat and drink while in MAU -infusion stopped early d/t patient symptoms and hydration status of UA -UA: hazy/mod leuks/many bacteria, sending urine for culture -UDS: +THC -CBC: WNL for pregnancy -BMP: no abnormalities requiring treatment -pt states only allergies to Reglan and Benadryl -pt states is only taking PNVs -pt discharged to home in stable condition  Orders Placed This Encounter  Procedures  . Culture, OB Urine    Standing Status:   Standing    Number of Occurrences:   1  . Urinalysis, Routine w reflex microscopic Urine, Clean Catch    Standing Status:   Standing    Number of Occurrences:   1  . CBC    Standing Status:   Standing    Number of Occurrences:   1  . Basic metabolic panel    Standing Status:   Standing    Number of Occurrences:   1  . Rapid urine drug screen (hospital performed)    Standing Status:   Standing    Number of Occurrences:   1  . AMB  referral to headache clinic    Referral Priority:   Routine    Referral Type:   Consultation    Referred to Provider:   Glyn Ade, Scot Jun, PA-C    Number of Visits Requested:   1  . Discharge patient    Order Specific Question:   Discharge disposition    Answer:   01-Home or Self Care [1]    Order Specific Question:   Discharge patient date    Answer:   10/22/2019   Meds ordered this encounter  Medications  . lactated ringers infusion  . dexamethasone (DECADRON) injection 10 mg  . promethazine (PHENERGAN) injection 25 mg  . DISCONTD: promethazine (PHENERGAN) 12.5 MG tablet    Sig: Take 1 tablet (12.5 mg total) by mouth every 6 (six) hours as needed for nausea or vomiting.    Dispense:  30 tablet    Refill:  0    Order Specific Question:   Supervising Provider    Answer:   Reva Bores [[id number]]  . promethazine (PHENERGAN) 12.5 MG tablet    Sig: Take 1 tablet (12.5 mg total) by mouth every 6 (six) hours as needed for nausea or vomiting.    Dispense:  30 tablet    Refill:  0    Order Specific Question:   Supervising Provider    Answer:   Reva Bores [[id number]]    Assessment and Plan   1. Pregnancy headache in second trimester   2. [redacted] weeks gestation of pregnancy     Allergies as of 10/22/2019      Reactions   Benadryl [diphenhydramine Hcl] Other (See Comments)   Reaction:  Red Man Syndrome    Reglan [metoclopramide] Shortness Of Breath   Chest pain, face numb and red   Lorazepam Other (See Comments)   Pt states that this med makes her feel "loopy".    Lorazepam Other (See Comments)   Vancomycin Itching   Adhesive [tape] Itching, Dermatitis      Medication List    STOP taking these medications   ibuprofen 600 MG tablet Commonly known as: ADVIL     TAKE these medications   cromolyn 5.2 MG/ACT nasal spray Commonly known as: NasalCrom Place 1 spray into both nostrils 4 (four) times daily.   ferrous sulfate 325 (65 FE) MG tablet Take 1 tablet (325 mg total) by  mouth 2 (two) times daily with a meal.  guaiFENesin 600 MG 12 hr tablet Commonly known as: Mucinex Take 1 tablet (600 mg total) by mouth 2 (two) times daily.   prenatal multivitamin Tabs tablet Take 1 tablet by mouth at bedtime.   ProAir HFA 108 (90 Base) MCG/ACT inhaler Generic drug: albuterol Inhale 2 puffs into the lungs 4 (four) times daily as needed for wheezing or shortness of breath.   promethazine 12.5 MG tablet Commonly known as: PHENERGAN Take 1 tablet (12.5 mg total) by mouth every 6 (six) hours as needed for nausea or vomiting.       -will call with culture results, if positive -list of OB providers given -referral to HA clinic -RX Tylenol and Phenergan for HA management, pt allergic to Reglan -OB providers list given, pt encouraged to schedule appt ASAP -safe meds in pregnancy list given -return MAU precautions given -pt discharged to home in stable condition  Dalin Caldera, Odie Sera, NP  8:40 PM 10/22/2019

## 2019-10-22 NOTE — Discharge Instructions (Signed)
Prenatal Care Providers           [facility] - accepts patients without insurance  Phone: [phone]  Center for [facility] @ [facility]   Phone: [phone]  [facility]       Phone: [phone]            [facility] for [facility] @ [place]     Phone: [phone]          [facility] for [facility] @ [place]   Phone: [phone]  [facility] for [facility] @ [facility] - accepts patients without insurance  Phone: [phone]  [facility] for [facility] @ [facility] Phone: [phone]     [facility] - accepts patients without insurance Phone: [phone]  [facility]  Phone: [phone]  [phone] OB/GYN Phone: [phone]  [facility] Phone: [phone]  [facility] Phone: [phone]  [facility] Phone: [phone]  [facility]  Phone: [phone]                         Safe Medications in Pregnancy    Acne: Benzoyl Peroxide Salicylic Acid  Backache/Headache: Tylenol: 2 regular strength every 4 hours OR              2 Extra strength every 6 hours  Colds/Coughs/Allergies: Benadryl (alcohol free) 25 mg every 6 hours as needed Breath right strips Claritin Cepacol throat lozenges Chloraseptic throat spray Cold-Eeze- up to three times per day Cough drops, alcohol free Flonase (by prescription only) Guaifenesin Mucinex Robitussin DM (plain only, alcohol free) Saline nasal spray/drops Sudafed (pseudoephedrine) & Actifed ** use only after [redacted] weeks gestation and if you do not have high blood pressure Tylenol Vicks Vaporub Zinc lozenges Zyrtec   Constipation: Colace Ducolax suppositories Fleet enema Glycerin suppositories Metamucil Milk of magnesia Miralax Senokot Smooth move tea  Diarrhea: Kaopectate Imodium A-D  *NO pepto  Bismol  Hemorrhoids: Anusol Anusol HC Preparation H Tucks  Indigestion: Tums Maalox Mylanta Zantac  Pepcid  Insomnia: Benadryl (alcohol free) 25mg  every 6 hours as needed Tylenol PM Unisom, no Gelcaps  Leg Cramps: Tums MagGel  Nausea/Vomiting:  Bonine Dramamine Emetrol Ginger extract Sea bands Meclizine  Nausea medication to take during pregnancy:  Unisom (doxylamine succinate 25 mg tablets) Take one tablet daily at bedtime. If symptoms are not adequately controlled, the dose can be increased to a maximum recommended dose of two tablets daily (1/2 tablet in the morning, 1/2 tablet mid-afternoon and one at bedtime). Vitamin B6 100mg  tablets. Take one tablet twice a day (up to 200 mg per day).  Skin Rashes: Aveeno products Benadryl cream or 25mg  every 6 hours as needed Calamine Lotion 1% cortisone cream  Yeast infection: Gyne-lotrimin 7 Monistat 7   **If taking multiple medications, please check labels to avoid duplicating the same active ingredients **take medication as directed on the label ** Do not exceed 4000 mg of tylenol in 24 hours **Do not take medications that contain aspirin or ibuprofen           Preterm Labor and Birth Information  The normal length of a pregnancy is 39-41 weeks. Preterm labor is when labor starts before 37 completed weeks of pregnancy. What are the risk factors for preterm labor? Preterm labor is more likely to occur in women who:  Have certain infections during pregnancy such as a bladder infection, sexually transmitted infection, or  infection inside the uterus (chorioamnionitis).  Have a shorter-than-normal cervix.  Have gone into preterm labor before.  Have had surgery on their cervix.  Are younger than age 54 or older than age 45.  Are African American.  Are pregnant with twins or multiple babies (multiple gestation).  Take street drugs or smoke while pregnant.  Do not gain enough weight while  pregnant.  Became pregnant shortly after having been pregnant. What are the symptoms of preterm labor? Symptoms of preterm labor include:  Cramps similar to those that can happen during a menstrual period. The cramps may happen with diarrhea.  Pain in the abdomen or lower back.  Regular uterine contractions that may feel like tightening of the abdomen.  A feeling of increased pressure in the pelvis.  Increased watery or bloody mucus discharge from the vagina.  Water breaking (ruptured amniotic sac). Why is it important to recognize signs of preterm labor? It is important to recognize signs of preterm labor because babies who are born prematurely may not be fully developed. This can put them at an increased risk for:  Long-term (chronic) heart and lung problems.  Difficulty immediately after birth with regulating body systems, including blood sugar, body temperature, heart rate, and breathing rate.  Bleeding in the brain.  Cerebral palsy.  Learning difficulties.  Death. These risks are highest for babies who are born before 34 weeks of pregnancy. How is preterm labor treated? Treatment depends on the length of your pregnancy, your condition, and the health of your baby. It may involve:  Having a stitch (suture) placed in your cervix to prevent your cervix from opening too early (cerclage).  Taking or being given medicines, such as: ? Hormone medicines. These may be given early in pregnancy to help support the pregnancy. ? Medicine to stop contractions. ? Medicines to help mature the baby's lungs. These may be prescribed if the risk of delivery is high. ? Medicines to prevent your baby from developing cerebral palsy. If the labor happens before 34 weeks of pregnancy, you may need to stay in the hospital. What should I do if I think I am in preterm labor? If you think that you are going into preterm labor, call your health care provider right away. How can I prevent preterm  labor in future pregnancies? To increase your chance of having a full-term pregnancy:  Do not use any tobacco products, such as cigarettes, chewing tobacco, and e-cigarettes. If you need help quitting, ask your health care provider.  Do not use street drugs or medicines that have not been prescribed to you during your pregnancy.  Talk with your health care provider before taking any herbal supplements, even if you have been taking them regularly.  Make sure you gain a healthy amount of weight during your pregnancy.  Watch for infection. If you think that you might have an infection, get it checked right away.  Make sure to tell your health care provider if you have gone into preterm labor before. This information is not intended to replace advice given to you by your health care provider. Make sure you discuss any questions you have with your health care provider. Document Revised: 04/26/2018 Document Reviewed: 05/26/2015 Elsevier Patient Education  2020 [employer].

## 2019-10-23 ENCOUNTER — Telehealth: Payer: Self-pay | Admitting: Radiology

## 2019-10-23 ENCOUNTER — Other Ambulatory Visit: Payer: Self-pay | Admitting: *Deleted

## 2019-10-23 ENCOUNTER — Encounter: Payer: Self-pay | Admitting: Radiology

## 2019-10-23 DIAGNOSIS — O0990 Supervision of high risk pregnancy, unspecified, unspecified trimester: Secondary | ICD-10-CM

## 2019-10-23 NOTE — Telephone Encounter (Signed)
Left message for patient to call [facility] to schedule New Headache appointment.  

## 2019-10-24 LAB — CULTURE, OB URINE: Culture: >=100000 — AB

## 2019-10-25 ENCOUNTER — Other Ambulatory Visit: Payer: Self-pay | Admitting: Student

## 2019-10-25 DIAGNOSIS — O2342 Unspecified infection of urinary tract in pregnancy, second trimester: Secondary | ICD-10-CM | POA: Insufficient documentation

## 2019-10-25 DIAGNOSIS — O2343 Unspecified infection of urinary tract in pregnancy, third trimester: Secondary | ICD-10-CM | POA: Insufficient documentation

## 2019-10-25 MED ORDER — CEFADROXIL 500 MG PO CAPS: 500.0000 mg | ORAL_CAPSULE | Freq: Two times a day (BID) | ORAL | 0 refills | Status: DC

## 2019-10-28 ENCOUNTER — Telehealth: Payer: Self-pay | Admitting: Radiology

## 2019-10-28 ENCOUNTER — Other Ambulatory Visit: Payer: Self-pay

## 2019-10-28 ENCOUNTER — Encounter: Payer: Self-pay | Admitting: Obstetrics and Gynecology

## 2019-10-28 ENCOUNTER — Encounter: Payer: Self-pay | Admitting: Radiology

## 2019-10-28 ENCOUNTER — Ambulatory Visit (INDEPENDENT_AMBULATORY_CARE_PROVIDER_SITE_OTHER): Payer: Self-pay | Admitting: Obstetrics and Gynecology

## 2019-10-28 VITALS — BP 112/72 | HR 80 | Wt 159.0 lb

## 2019-10-28 DIAGNOSIS — O09299 Supervision of pregnancy with other poor reproductive or obstetric history, unspecified trimester: Secondary | ICD-10-CM | POA: Insufficient documentation

## 2019-10-28 DIAGNOSIS — Z3A22 22 weeks gestation of pregnancy: Secondary | ICD-10-CM

## 2019-10-28 DIAGNOSIS — O2342 Unspecified infection of urinary tract in pregnancy, second trimester: Secondary | ICD-10-CM

## 2019-10-28 DIAGNOSIS — O0990 Supervision of high risk pregnancy, unspecified, unspecified trimester: Secondary | ICD-10-CM

## 2019-10-28 DIAGNOSIS — Z8489 Family history of other specified conditions: Secondary | ICD-10-CM

## 2019-10-28 DIAGNOSIS — O26892 Other specified pregnancy related conditions, second trimester: Secondary | ICD-10-CM | POA: Insufficient documentation

## 2019-10-28 DIAGNOSIS — R519 Headache, unspecified: Secondary | ICD-10-CM

## 2019-10-28 MED ORDER — SULFAMETHOXAZOLE-TRIMETHOPRIM 800-160 MG PO TABS: 1.0000 | ORAL_TABLET | Freq: Two times a day (BID) | ORAL | 0 refills | Status: AC

## 2019-10-28 NOTE — Progress Notes (Signed)
New OB Note  10/28/2019   Clinic: [facility] for [facility] Complaint: transfer of care from [place].   History of Present Illness: Ms. Fesperman is a 29 y.o. [id number] @ 22/1 weeks ([place] 2/14, based on Patient's last menstrual period was 05/26/2019.=8wk u/s).   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   No decreased FM or PTL s/s. Patient went to triage recently for HA s/s that resolved with medications; pt has h/o HA  ROS: A 12-point review of systems was performed and negative, except as stated in the above HPI.  OBGYN History: As per HPI. OB History  Gravida Para Term Preterm AB Living  '6 5 5 ' 0 0 4  SAB TAB Ectopic Multiple Live Births  0 0 0 0 5    # Outcome Date GA Lbr Len/2nd Weight Sex Delivery Anes PTL Lv  6 Current           5 Term 01/23/19          4 Term 12/20/17          3 Term 10/03/16 [redacted]w[redacted]d        2 Term 10/13/14 443w5d0:42 / 00:15 8 lb 3 oz (3.714 kg) F Vag-Spont EPI  DEC  1 Term 05/27/13 4136w0d:30 / 02:19 8 lb 10.3 oz (3.92 kg) M Vag-Spont EPI  LIV    Obstetric Comments  Deceased at 11 months from cyst in throat.      Past Medical History: Past Medical History:  Diagnosis Date  . Anxiety   . Asthma   . Borderline personality disorder (HCCWhite Cloud . Depression   . 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 Manic depression (HCCEmmons . Migraine with aura 04/25/2012  . Pelvic inflammatory disease 04/25/2015    Past Surgical History: Past Surgical History:  Procedure Laterality Date  . WISDOM TOOTH EXTRACTION      Family History:  Family History  Problem Relation Age of Onset  .  Diabetes Maternal Grandfather   . Hyperlipidemia Maternal Grandfather   . Stroke Maternal Grandfather   . Cancer Maternal Grandfather   . Hypertension Maternal Grandfather   . Heart disease Maternal Grandfather   . Heart murmur Mother   . Miscarriages / StiK[place]ather   . Diabetes Mother   . Stroke Mother   . Lupus Mother     Social History:  Social History   Socioeconomic History  . Marital status: Single    Spouse name: Not on file  . Number of children: Not on file  . Years of education: Not on file  . Highest education level: Not on file  Occupational History  . Not on file  Tobacco Use  . Smoking status: Never Smoker  . Smokeless tobacco: Never Used  Substance and Sexual Activity  . Alcohol use: No  . Drug use: No  . Sexual activity: Yes    Birth control/protection: None  Other Topics Concern  . Not on file  Social History Narrative  . Not on file   Social Determinants of Health   Financial Resource Strain:   . Difficulty of Paying Living Expenses: Not on file  Food Insecurity:   . Worried About Running  Out of Food in the Last Year: Not on file  . Ran Out of Food in the Last Year: Not on file  Transportation Needs:   . Lack of Transportation (Medical): Not on file  . Lack of Transportation (Non-Medical): Not on file  Physical Activity:   . Days of Exercise per Week: Not on file  . Minutes of Exercise per Session: Not on file  Stress:   . Feeling of Stress : Not on file  Social Connections:   . Frequency of Communication with Friends and Family: Not on file  . Frequency of Social Gatherings with Friends and Family: Not on file  . Attends Religious Services: Not on file  . Active Member of Clubs or Organizations: Not on file  . Attends Archivist Meetings: Not on file  . Marital Status: Not on file  Intimate Partner Violence:   . Fear of Current or Ex-Partner: Not on file  . Emotionally Abused: Not on file  . Physically Abused: Not on  file  . Sexually Abused: Not on file     Allergy: Allergies  Allergen Reactions  . Benadryl [Diphenhydramine Hcl] Other (See Comments)    Reaction:  Red Man Syndrome   . Reglan [Metoclopramide] Shortness Of Breath    Chest pain, face numb and red  . Lorazepam Other (See Comments)    Pt states that this med makes her feel "loopy".   . Lorazepam Other (See Comments)  . Vancomycin Itching  . Adhesive [Tape] Itching and Dermatitis    Health Maintenance:  Mammogram Up to Date: not applicable  Current Outpatient Medications: Prenatal vitamin  Physical Exam:   BP 112/72   Pulse 80   Wt 159 lb (72.1 kg)   LMP 05/26/2019   BMI 26.46 kg/m  Body mass index is 26.46 kg/m. Contractions: Not present Vag. Bleeding: None. Fundal height: 22 FHTs: 130s  General appearance: Well nourished, well developed female in no acute distress.  Neuro/Psych:  Normal mood and affect.   Laboratory: 07/28/19: A positive, antibody negative/rubella immune/rpr neg/hiv neg/cbc neg/gc-ct/neg/a1c neg/pap negative. +UCx 100k klebsiella, resistant to amp and intermediate to macrobid  Imaging:  7/12: SLIUP, 8/2 on CRL. Patient states this is the only u/s she's had during the pregnancy  Assessment: pt stable  Plan: 1. Urinary tract infection in mother during second trimester of pregnancy Pt states she has never taken abx for this. Bactrim ds sent in for her. toc in 4-6wks  2. History of postpartum hemorrhage, currently pregnant After last vag delivery, cause unknown, needed two units PRBCs. She states that her providers in [place] stated an elective c/s at 39wks would be safest due to this and b/c of the pain she had with her last delivery which she states they though was due to pubic symphysis separation. I told her that I'd recommend trial of labor b/c it should have decreased risk of bleeding and maybe her epidural wasn't working well. Pt really wants elective c-section and I told her we can talk to her  more about this at her subsequent visits  3. 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 She states last two kids had to be delivered at a [facility]. I told her that we will figure out delivery planning after next week.   4. Supervision of high risk pregnancy, antepartum Sign BTL papers once she has [place] insurance. Pt met with navigator today. Panorama and horizon today.   Problem list  reviewed and updated.  Follow up in 3 weeks.  >50% of 30 min visit spent on counseling and coordination of care.     Durene Romans MD Attending Center for [facility])

## 2019-10-28 NOTE — Telephone Encounter (Signed)
Left message on voicemail for patient with next appointment time and date at [facility]

## 2019-10-29 ENCOUNTER — Encounter (HOSPITAL_COMMUNITY): Payer: Self-pay | Admitting: Obstetrics and Gynecology

## 2019-10-29 ENCOUNTER — Inpatient Hospital Stay (HOSPITAL_COMMUNITY)
Admission: AD | Admit: 2019-10-29 | Discharge: 2019-10-29 | Discharge status: Against Medical Advice | Payer: Self-pay | Source: Ambulatory Visit | Attending: Obstetrics and Gynecology | Admitting: Obstetrics and Gynecology

## 2019-10-29 ENCOUNTER — Encounter: Payer: Self-pay | Admitting: Advanced Practice Midwife

## 2019-10-29 ENCOUNTER — Other Ambulatory Visit: Payer: Self-pay

## 2019-10-29 DIAGNOSIS — O4692 Antepartum hemorrhage, unspecified, second trimester: Secondary | ICD-10-CM | POA: Insufficient documentation

## 2019-10-29 DIAGNOSIS — Z3A22 22 weeks gestation of pregnancy: Secondary | ICD-10-CM | POA: Insufficient documentation

## 2019-10-29 DIAGNOSIS — Z5321 Procedure and treatment not carried out due to patient leaving prior to being seen by health care provider: Secondary | ICD-10-CM | POA: Insufficient documentation

## 2019-10-29 NOTE — MAU Note (Signed)
Pt left AMA due to transportation issues. She will return to be seen by provider.

## 2019-10-29 NOTE — MAU Note (Signed)
.   Monica Munoz is a 29 y.o. at [redacted]w[redacted]d here in MAU reporting: she noticed small amount of vaginal bleeding when she wiped sat and Sunday and it stopped and started back yesterday. Denies any pain  Onset of complaint: 10/25/19 Pain score: 0 Vitals:   10/29/19 1410  BP: 125/68  Pulse: 78  Resp: 18  Temp: 98.2 F (36.8 C)  SpO2: 100%     FHT: Lab orders placed from triage: UA

## 2019-11-03 ENCOUNTER — Encounter: Payer: Self-pay | Admitting: Radiology

## 2019-11-05 ENCOUNTER — Ambulatory Visit: Payer: Self-pay | Attending: Obstetrics and Gynecology

## 2019-11-05 ENCOUNTER — Ambulatory Visit (HOSPITAL_BASED_OUTPATIENT_CLINIC_OR_DEPARTMENT_OTHER): Payer: Self-pay | Admitting: Genetic Counselor

## 2019-11-05 ENCOUNTER — Encounter: Payer: Self-pay | Admitting: *Deleted

## 2019-11-05 ENCOUNTER — Telehealth: Payer: Self-pay | Admitting: Radiology

## 2019-11-05 ENCOUNTER — Ambulatory Visit: Payer: Self-pay | Admitting: *Deleted

## 2019-11-05 ENCOUNTER — Encounter: Payer: Self-pay | Admitting: Radiology

## 2019-11-05 ENCOUNTER — Ambulatory Visit: Payer: Self-pay | Admitting: Genetic Counselor

## 2019-11-05 ENCOUNTER — Other Ambulatory Visit: Payer: Self-pay | Admitting: *Deleted

## 2019-11-05 ENCOUNTER — Other Ambulatory Visit: Payer: Self-pay

## 2019-11-05 VITALS — BP 115/74 | HR 84

## 2019-11-05 DIAGNOSIS — Z315 Encounter for genetic counseling: Secondary | ICD-10-CM

## 2019-11-05 DIAGNOSIS — F1210 Cannabis abuse, uncomplicated: Secondary | ICD-10-CM | POA: Insufficient documentation

## 2019-11-05 DIAGNOSIS — Z363 Encounter for antenatal screening for malformations: Secondary | ICD-10-CM

## 2019-11-05 DIAGNOSIS — Z840 Family history of diseases of the skin and subcutaneous tissue: Secondary | ICD-10-CM

## 2019-11-05 DIAGNOSIS — O0990 Supervision of high risk pregnancy, unspecified, unspecified trimester: Secondary | ICD-10-CM | POA: Insufficient documentation

## 2019-11-05 DIAGNOSIS — Z8489 Family history of other specified conditions: Secondary | ICD-10-CM

## 2019-11-05 NOTE — Telephone Encounter (Signed)
Spoke with patient and informed her of Panorama results and fetal sex.

## 2019-11-05 NOTE — Progress Notes (Signed)
11/05/2019  Monica Munoz 11/22/1990 MRN: [id number] DOV: 11/05/2019  Monica Munoz presented to the [facility] for a genetics consultation regarding her history of two previous children with epidermolysis bullosa. Monica Munoz was accompanied to her appointment by her partner, Monica Munoz.   Indication for genetic counseling - Previous children with epidermolysis bullosa   Prenatal history  Monica Munoz is a [id number], 29 y.o. female. Her current pregnancy has completed [redacted]w[redacted]d (Estimated Date of Delivery: 03/01/20). Monica Munoz and her partner have a healthy 40 year old son and three year old son. They had a daughter who died at 52 months of a basal lingual cyst. They have a 57 year old son named Monica Munoz and a 58 year old daughter named Monica Munoz, both of whom had epidermolysis bullosa. The couple moved to [place] from [place] last month.  Monica Munoz denied exposure to environmental toxins or chemical agents. She denied the use of alcohol, tobacco or street drugs. She reported taking prenatal vitamins. She denied significant viral illnesses, fevers, and bleeding during the course of her pregnancy. Her medical and surgical histories were noncontributory.  Family History  A three generation pedigree was drafted and reviewed. The family history is remarkable for the following:  - Monica Munoz has two children with epidermolysis bullosa. See Discussion section for more details.  - Monica Munoz has a maternal half sister who has a child with polydactyly. We discussed that polydactyly is a form of a congenital birth defect. We discussed that 3-5% of all pregnancies are affected by a birth defect. Many times, these defects are isolated. However, polydactyly in particular can appear to run in families. Given that no one else in the family has polydactyly, risk of recurrence is likely low. However, this cannot be ruled out.  - Monica Munoz's mother had a stroke at age 60. Early stroke can  be a feature of many genetic disorders with many possible inheritance patterns. However, strokes can also occur due to a variety of other environmental or health-related causes. We discussed that without knowing the precise etiology of Monica Munoz's mother's stroke, risk assessment is limited.  - Monica Munoz's partner, Monica Munoz has a brother who was born with a heart murmur. The exact etiology of this murmur is unknown and records are not available. Thus, risk assessment is limited.  - Monica Munoz has a paternal aunt who has a daughter with seizures. We discussed that seizures can occur secondary to a variety of environmental, lifestyle, and genetic factors. When epilepsy does not have an identified genetic cause, the chance that a first degree relative of someone with epilepsy will also have or develop epilepsy is approximately 2-5%. Given that Monica Munoz cousin is a fourth degree relative to the current fetus, recurrence risk for epilepsy may be slightly elevated over the general population risk of 1%. However, without knowing the etiology of the seizures in the family, precise risk assessment is limited.  The remaining family histories were reviewed and found to be noncontributory for birth defects, intellectual disability, recurrent pregnancy loss, and known genetic conditions.    The patient's ancestry is Caucasian. The father of the pregnancy's ancestry is Monica Munoz. Ashkenazi Jewish ancestry and consanguinity were denied. Pedigree will be scanned under Media.  Discussion  Family history:  [name] was referred for genetic counseling due to her history of having two previous children with epidermolysis bullosa (EB). Per [name], her children have the simplex form of EB. Her son Monica Munoz  reportedly had genetic testing which confirmed that he had the dominant form of EB simplex (EBS); however, since this testing was performed in [place], this report was not available for my review. Given that  Monica Munoz was diagnosed with dominant EBS, the couple was reportedly counseled that recurrence risk was low, ~1%. However, their daughter Sander Radon was diagnosed with EBS at the time of her birth the following year. [name] indicated that Monica Munoz and Nova's symptoms are improving with age and as they develop calluses.   Epidermolysis bullosa:  EB is a skin disorder characterized by blister formation and skin erosion in response to minor injury or friction, such as rubbing or scratching. Blisters can form on the skin, in the oral cavity, and in severe cases, on the external surface of the ear or in the gastrointestinal, genitourinary, and/or respiratory tracts. Disfiguring scars and disabling musculoskeletal deformities can occur due to blistering.  There are four types with EB with several additional subtypes. Epidermolysis bullosa simplex (EBS) is one of the major forms of EB. Signs and symptoms of this condition vary widely among affected individuals. Blistering primarily affects the hands and feet in mild cases and the blisters usually heal without leaving scars. Severe cases of this condition involve widespread blistering that can lead to infections, dehydration, and other medical problems.   EBS can be caused by several genes, most commonly the KRT5 or KRT14 genes. These genes provide instructions for creating the keratin 5 and keratin 14 proteins which are responsible for maintaining the integrity of the epidermis. EBS is usually inherited in an autosomal dominant pattern, which means that one copy of the altered gene in each cell is sufficient to cause the disorder. Some affected individuals inherit the mutation from one affected parent while other cases result from de novo (new) mutations and occur in people with no history of the disorder in their family. Rarely, EBS may be inherited in an autosomal recessive pattern. In these cases, both parents must be carriers of the disorder in order to be at risk of  having an affected child.  We discussed that there are other types of EBS that are caused by mutations in other genes. Ms. Mcclafferty agreed to send me a photo of her son's genetic testing report via MyChart so that I can confirm the gene in which he has a mutation as well as the mode of inheritance. She informed me that neither she nor her partner have undergone testing for her son's mutation to determine if either parent carries the variant. We reviewed that some individuals may have a mild form of EB that can cause features like nail dystrophy and thus can go undiagnosed. The couple was counseled that if either of them have the same mutation as their son, recurrence risks for all of their pregnancies would be 50%. If neither parent carries the mutation, recurrence risk would be reduced, likely to ~1% due to the possibility of germline mosaicism. Germline mosaicism describes a phenomenon in which only some egg cells or sperm cells have a genetic change. Since it is not possible to test all egg cells or sperm cells, the chance of recurrence in future children is estimated to be low, but cannot not be ruled out. If Ms. Coventry's son truly has the dominant form of EBS and neither parent carries his variant, it is possible that germline mosaicism could be the explanation for why their daughter was born with the same condition. If, however, the children have a recessive form of  EB, recurrence risk would be 1 in 4 (25%) for each pregnancy.  We discussed that parental testing could help to refine the risk of EBS for the current and future fetuses. Ms. Ladley and her partner indicated that they are interested in pursuing parental testing.   Ultrasound:  A complete ultrasound was performed today prior to our visit. The ultrasound report will be sent under separate cover. There were no visualized fetal anomalies or markers suggestive of aneuploidy. However, the fetus was measuring small. We reviewed that most cases of EB  cannot be diagnosed via prenatal ultrasound. Ms. Braddy informed me that neither Monica Munoz nor Sander Radon demonstrated any signs of the condition on ultrasound. However, they both measured small as well.  Aneuploidy screening results:  We also reviewed Ms. Lebeck's aneuploidy screening result. Ms. Stierwalt had Panorama NIPS through the laboratory [facility] that was low-risk for fetal aneuploidies. We reviewed that these results showed a less than 1 in 10,000 risk for trisomies 21, 18 and 13, and monosomy X (Turner syndrome).  In addition, the risk for triploidy and sex chromosome trisomies (47,XXX and 47,XXY) was also low. Ms. Karis elected to have cfDNA analysis for 22q11.2 deletion syndrome, which was also low risk (1 in 9000). We reviewed that while this testing identifies 94-99% of pregnancies with trisomy 59, trisomy 6, trisomy 63, sex chromosome aneuploidies, and triploidy, it is NOT diagnostic. A positive test result requires confirmation by CVS or amniocentesis, and a negative test result does not rule out a fetal chromosome abnormality. She also understands that this testing does not identify all genetic conditions.  Carrier screening results:  We also reviewed that Ms. Givhan had Horizon carrier screening performed through [place], which was negative for 14 conditions. Thus, her risk to be a carrier for these 14 conditions (listed separately in the laboratory report) has been reduced but not eliminated. This puts her pregnancies at significantly decreased risk of being affected by one of these conditions. Of note, EB is not one of the disorders that is included on the Horizon-14 carrier screening panel.  Diagnostic testing:  Ms. Gail was also counseled regarding diagnostic testing via amniocentesis. We discussed the technical aspects of the procedure and quoted up to a 1 in 500 (0.2%) risk for spontaneous pregnancy loss or other adverse pregnancy outcomes as a result of amniocentesis. Cultured cells from an  amniocentesis sample allow for the visualization of a fetal karyotype, which can detect >99% of chromosomal aberrations. Chromosomal microarray can also be performed to identify smaller deletions or duplications of fetal chromosomal material. Amniocentesis could also be performed to assess whether the baby is affected by EB via targeted variant analysis for Nico's specific mutation. After careful consideration, Ms. Revuelta declined amniocentesis at this time. She informed me that she would likely not be interested in pursuing amniocentesis even if she or her partner are found to carry Nico's mutation. They have previously lost an infant and would not want to take any unnecessary risks because of that. Ms. Zeis understands that amniocentesis is available at any point after 16 weeks of pregnancy and that she may opt to undergo the procedure at a later date should she change her mind.  Plan:  Ms. Newby will send me a photo of Nico's genetic testing report through MyChart. Once I receive this report, I will confirm the type of EB Monica Munoz has as well as the anticipated mode of inheritance to provide a more accurate risk assessment for the couple. I will also facilitate parental  testing for Nico's variant for both Ms. Daphine DeutscherMartin and her partner.   I offered Ms. Daphine DeutscherMartin several EB support resources, including information about [facility] [place] and the [employer]. Ms. Daphine DeutscherMartin had previously been connected with [facility] and declined information about research studies on EB. Since the [place] has just moved to the area from [place], they have not yet established specialist care for [place] or [place]. I will contact our pediatric genetics team for their recommendations on dermatologists and other providers that should be involved in [place] and Nova's care. I will then contact Ms. Daphine DeutscherMartin with these recommendations.  I counseled Ms. Daphine DeutscherMartin regarding the above risks and available options.  The approximate face-to-face  time with the genetic counselor was 45 minutes.  In summary:  Reviewed family history concerns  Two previous children with epidermolysis bullosa  Children reportedly have dominant form of epidermolysis bullosa simplex. Patient will send me photo of her son's genetic testing report for confirmation and more accurate risk assessment  Interested in parental testing to refine recurrence risk for current & future fetuses. I will help facilitate this after receiving son's testing report  Reviewed results of ultrasound  No fetal anomalies or markers seen (suboptimal views)  Reduction in risk for fetal aneuploidy  Fetus currently measuring small. Will continue to follow via growth ultrasounds  Reviewed low-risk NIPS  Reduction in risk for Down syndrome, trisomy 5618, trisomy 4713, triploidy, sex chromosome aneuploidies, and 22q11.2 deletion syndrome  Reviewed negative Horizon-14 carrier screening results  Offered additional testing and screening  Declined amniocentesis  Gershon CraneHaley E Ronald Londo, MS, CGC Genetic Counselor

## 2019-11-10 ENCOUNTER — Encounter: Payer: Self-pay | Admitting: Genetic Counselor

## 2019-11-25 ENCOUNTER — Other Ambulatory Visit: Payer: Self-pay

## 2019-11-25 ENCOUNTER — Ambulatory Visit (INDEPENDENT_AMBULATORY_CARE_PROVIDER_SITE_OTHER): Payer: Self-pay | Admitting: Nurse Practitioner

## 2019-11-25 ENCOUNTER — Encounter: Payer: Self-pay | Admitting: Nurse Practitioner

## 2019-11-25 VITALS — BP 120/79 | HR 85 | Wt 164.2 lb

## 2019-11-25 DIAGNOSIS — O2342 Unspecified infection of urinary tract in pregnancy, second trimester: Secondary | ICD-10-CM

## 2019-11-25 DIAGNOSIS — Z3A26 26 weeks gestation of pregnancy: Secondary | ICD-10-CM

## 2019-11-25 DIAGNOSIS — Z7189 Other specified counseling: Secondary | ICD-10-CM

## 2019-11-25 DIAGNOSIS — O0990 Supervision of high risk pregnancy, unspecified, unspecified trimester: Secondary | ICD-10-CM

## 2019-11-25 DIAGNOSIS — O0992 Supervision of high risk pregnancy, unspecified, second trimester: Secondary | ICD-10-CM

## 2019-11-25 NOTE — Progress Notes (Signed)
Pain In stomach, bk and  Chest  Needs to discuss tubal

## 2019-11-25 NOTE — Patient Instructions (Signed)
AREA PEDIATRIC/FAMILY PRACTICE PHYSICIANS  [place] ([address]) . [facility] o Chambliss, MD; Eniola, MD; Hale, MD; Hensel, MD; McDiarmid, MD; McIntyer, MD; Neal, MD; Walden, MD o [address] [place], [place] [address] o [phone] o Mon-Fri 8:30-12:30, 1:30-5:00 o Providers come to see babies at [facility] o Accepting Medicaid . [facility] o Limited providers who accept newborns: Koirala, MD; Morrow, MD; Wolters, MD o [address], [place], [place] [address] o [phone] o Mon-Fri 8:00-5:30 o Babies seen by providers at [facility] o Does NOT accept Medicaid o Please call early in hospitalization for appointment (limited availability)  . [facility] o Mulberry, MD o [address] [place], [place] [address] o [phone] o Mon, Tue, Thur, Fri 8:30-5:00, Wed 10:00-7:00 (closed 1-2pm) o Babies seen by [facility] providers o Accepting Medicaid . [facility] - Pediatrician o Rubin, MD o [address], [place], [place] [address] o [phone] o Mon-Fri 8:30-5:00, Sat 8:30-12:00 o Provider comes to see babies at [facility] o Accepting Medicaid o Must have been referred from current patients or contacted office prior to delivery . [facility] ([facility]) o Brown, MD; Chandler, MD; Ettefagh, MD; Grant, MD; Lester, MD; McCormick, MD; McQueen, MD; Prose, MD; Simha, MD; Stanley, MD; Stryffeler, NP; Tebben, NP o [address], [place], [place] [address] o [phone] o Mon, Tue, Thur, Fri 8:30-5:30, Wed 9:30-5:30, Sat 8:30-12:30 o Babies seen by [facility] providers o Accepting Medicaid o Only accepting infants of first-time parents or siblings of current patients o Hospital discharge coordinator will make follow-up appointment . Jack Amos o [address],  [place], [place]  [address] o [phone]   Fax - [phone] . [facility] o [address], [place], [place]  [address] o Phone - [phone]   Fax - [phone] . Shilpa Gosrani o [address], [place], [place]  [address] o [phone]  [place] ([address]) . [facility] o Bates, MD; Brassfield, MD; Cooper, Cox, MD; MD; Davis, MD; Dovico, MD; Ettefaugh, MD; Little, MD; Lowe, MD; Keiffer, MD; Melvin, MD; Sumner, MD; Williams, MD o [address], [place], [place] [address] o [phone] o Mon-Fri 8:30-5:00 (extended evenings Mon-Thur as needed), Sat-Sun 10:00-1:00 o Providers come to see babies at [facility] o Accepting Medicaid for families of first-time babies and families with all children in the household age 3 and under. Must register with office prior to making appointment (M-F only). . [facility] o Henson, NP; Knapp, MD; Lalonde, MD; Tysinger, PA o [address] [place], [place] [address] o [phone] o Mon-Fri 8:00-5:00 o Babies seen by providers at [facility] o Does NOT accept Medicaid/Commercial Insurance Only . [facility] ([facility])  o Artis, MD; Barnes, MD; Bratton, MD; Coccaro, MD; Lockett Gardner, MD; Kramer, MD; Marshall, MD; Netherton, MD; Poleto, MD; Skinner, MD o [address] [place],  [address] o [phone] o Mon-Fri 8:30-5:30, Sat (Oct.-Mar.) 9:00-1:00 o Babies seen by providers at [facility] o Accepting Medicaid  [place] ([address]) . [facility] o Reid, MD; Warner, MD o [address], ,  [address] o [phone] o Mon-Fri 8:30-5:00, Sat 8:30-12:00 o Providers come to see babies at [facility] o Does NOT accept Medicaid . [facility] o Becker, PA; Hagler, MD; Scifres, PA; Sun, MD; Swayne, MD o [address],  [place], [place] [address] o [phone] o Mon-Fri 8:00-5:00 o Babies seen by providers at [facility] o Does NOT accept Medicaid o Only accepting babies of parents who are patients o Please call early in hospitalization for appointment (limited availability) . [facility] o Clark, MD; Frye, MD; Kelleher, MD; Mack, NP; Miller, MD; O'Keller, MD; Patterson, NP; Pudlo, MD; Puzio, MD; Thomas, MD; Tucker, MD; Twiselton, MD o [address], [place], [place] [address] o [phone] o Mon-Fri 8:00-5:00, Sat 9:00-12:00 o Providers come to see babies at [facility] o Does NOT accept Medicaid  [place] ([address]) . [facility] o Limited providers accepting new patients: Brake, NP; Wharton, PA o [address], [place], [place] [address] o [phone] o Mon-Fri 8:00-5:00 o Babies seen by providers at [facility] o Does NOT accept Medicaid o Only accepting babies of parents who are patients o Please call early in hospitalization for appointment (limited availability) . [facility] o Gay, MD; Quinlan, MD o [address] [place], [place] [address] o [phone] (press 1 to schedule appointment) o Mon-Fri 8:00-5:00 o Providers come to see babies at [facility] o Does NOT accept Medicaid . [facility] o Mazer, MD o [address] [place], [place] [address] o [phone] o Mon-Fri 8:30-5:00 (lunch 12:30-1:00), extended hours by appointment only Wed 5:00-6:30 o Babies seen by [facility] providers o Accepting Medicaid . [facility] o Banks, MD; Jordan, MD; Koberlein, MD o [address], [place], [place] [address] o [phone] o Mon-Fri 8:00-5:00 o Babies seen by [facility] providers o Does NOT accept Medicaid . [facility] o Parker, MD; Hunter, MD; Wallace, DO o [address] [place], [place]  [address] o [phone] o Mon-Fri 8:00-5:00 o Babies seen by [facility] providers o Does NOT accept Medicaid . [facility] o Brandon, PA; Brecken, PA; Christy, NP; Dees, MD; DeClaire, MD; DeWeese, MD; Hansen, NP; Mills, NP; Parrish, NP; Smoot, NP; Summer, MD; Vapne, MD o [address] [place], [place] [address] o [phone] o Mon-Fri 8:30-5:00, Sat 10:00-1:00 o Providers come to see babies at [facility] o Does NOT accept Medicaid o Free prenatal information session Tuesdays at 4:45pm . [facility] o Bouska, MD; Gordon, PA; Jeffery, PA; Weber, PA o [address] [place] [address] o [phone] o Mon-Fri 7:30-5:30 o Babies seen by [facility] providers . [facility] o [address], [place], [place]  [address] o [phone]   Fax - [phone]  [place] ([address] & [address]) . [facility] o Reese, MD o [address] [place], [place] [address] o [phone] o Mon-Thur 8:00-6:00 o Providers come to see babies at [facility] o Accepting Medicaid . [facility] o Anderson, NP; Badger, MD; Beal, PA; Spencer, PA o [address] [place],  [address] o [phone] o Mon-Thur 7:30-7:30, Fri 7:30-4:30 o Babies seen by [facility] providers o Accepting Medicaid . [facility] o Agbuya, MD; Klett, NP; Romgoolam, MD o [address], [place],  [address] o [phone] o Mon-Fri 8:30-5:00, Sat 8:30-12:00 o Providers come to see babies at [facility] o Accepting Medicaid o Must have "Meet & Greet" appointment at office prior to delivery . [facility] ([facility] o McCord,   MD; Wallace, MD; Wood, MD o [address], [place], [place] [address] o [phone] o Mon-Wed 8:00-6:00, Thur-Fri 8:00-5:00, Sat 9:00-12:00 o Providers come to  see babies at [facility] o Does NOT accept Medicaid o Only accepting siblings of current patients . [facility]  o [address], [place], [place]  [address] o [phone]   Fax - [phone] . [facility] o [address], [place], [place]  [address] o [phone]   Fax - [phone]  [place] ([address]) . [facility] o Cirigliano, DO; Matthews, DO o [address] [place], [place] [address] o [phone] o Mon-Fri 7:00-5:00 o Babies seen by [facility] providers o Does NOT accept Medicaid . [facility] o Briscoe, MD; Howley, PA; Moreira, PA o [address], [place], [place] [address] o [phone] o Mon-Fri 8:00-5:00 o Babies seen by [facility] providers o Accepting Medicaid . [facility] o Boyd, MD; Church, PA; Jones, NP; Osborn, PA o [address], [place], [place] [address] o [phone] o Mon-Fri 8:00-5:00 o Babies seen by providers at [facility] o Accepting Medicaid  [place] ([address]) . [facility] [place] o Wendling, DO o [address] [place], [place] [address] o [phone] o Mon-Fri 8:00-5:00 o Babies seen by [facility] providers o Does NOT accept Medicaid o Limited availability, please call early in hospitalization to schedule follow-up . [facility] o Calderon, PA; Cummings, MD; Dillard, MD; Castor, PA; Olson, MD; VanDeven, PA o [address], [place], [place] [address] o [phone] o Mon-Fri 8:30-5:00, Sat 9:00-12:00 o Babies seen by providers at [facility] o Accepting Medicaid o Please register online then schedule online or call office o [email or web address] . [facility] ([facility] o Hunter, NP; Kumar, MD; Whipple Rogers, PA o [address], [place], [place] [address] o [phone] o Mon-Fri 8:00-5:00 o Babies seen by providers at [facility] o Accepting Medicaid . [facility] ([facility]) o Waldo, MD; Kristi Fleenor, NP; West, MD o [address], [place], [place] [address] o [phone] o Mon-Fri 8:00-5:30, Sat&Sun by appointment (phones open at 8:30) o Babies seen by [facility] providers o Accepting Medicaid o Must be a first-time baby or sibling of current patient . [facility] [place]  o [address], [place], [place]  [address] o [phone]   Fax - [phone]  [place] ([address]) . [facility] o Brown, PA; Cowen, PA; Rice, MD; Helton, PA; Spry, MD o [address] [place], [place] [address] o [phone] o Mon-Thur 8:00-7:00, Fri 8:00-5:00, Sat 8:00-12:00, Sun 9:00-12:00 o Babies seen by [facility] providers o Accepting Medicaid . [facility] o Coe-Goins, MD; Marshall, MD; Pierre-Louis, MD o [address], [place], [place] [address] o [phone] o Mon-Thur 8:00-5:00 o Babies seen by providers at [facility] o Accepting Medicaid . [facility] o Bratton, MD; Coe-Goins, MD; Hayes, MD; Lewis, MD; List, MD; Lott, MD; Marshall, MD; Moran, MD; O'Neal, MD; Pierre-Louis, MD; Pitonzo, MD; Scholer, MD; Spangle, MD o [address] [place], [place]   [address] o [phone] o Mon-Fri 8:00-5:30, Sat (Oct.-Mar.) 9:00-1:00 o Babies seen by providers at [facility] o Accepting Medicaid o Must fill out new patient packet, available online at [email or web address] . [facility] ([facility]) o Friddle, NP; Harris, NP; Kelly, NP; Logan, MD;  Melvin, PA; Poth, MD; Ramadoss, MD; Stanton, NP o [address], [place], [place] [address] o [phone] o Mon-Thur 8:00-5:30, Fri 8:00-5:00 o Babies seen by providers at [facility] o Accepting Medicaid  [place] ([address]) . [facility] o Dixon, PA; [place], MD; Pickard, MD; Tapia, PA o [address], [place], [place] [address] o [phone] o Mon-Fri 8:00-5:00 o Babies seen by providers at [facility] o Accepting Medicaid   [place] ([address]) . [facility] [place] o Masneri, DO; Meyers, MD; Nelson, PA o [address], [place], [place] [address] o [phone] o Mon-Fri 8:00-5:00 o Babies seen by providers at [facility] o Does NOT accept Medicaid o Limited appointment availability, please call early in hospitalization  . [facility] [place] o Kunedd, DO; McGowen, MD o [address], [place], [place] [address] o [phone] o Mon-Fri 8:00-5:00 o Babies seen by [facility] providers o Does NOT accept Medicaid . [facility] [place] o Cameron, MD; MacDonald, MD; Michaels, PA; Nayak, MD o [address] [place] [address], [place], [place] [address] o [phone] o Mon-Fri 8:00-5:00 o After hours clinic ([address] [place], [place] [address]) [phone] Mon-Fri 5:00-8:00, Sat 12:00-6:00, Sun 10:00-4:00 o Babies seen by [facility] providers o Accepting Medicaid . [facility] [place] o [address], [place], [place]  [address] o [phone]   Fax - [phone]  [place] ([address]) .  [facility] [place] [facility] o Andy, MD o [address], [place], [place] [address] o [phone] o Mon-Fri 8:00-5:00 o Babies seen by [facility] providers o Does NOT accept Medicaid . [facility] [place] ([facility] [place]) o Eksir, MD o [address], [place], [place]  [address] o [phone] o Mon-Thur 8:00-7:00, Fri 8:00-5:00, Sat 8:00-12:00 o Babies seen by providers at [facility] o Accepting Medicaid - but does not have vaccinations in office (must be received elsewhere) o Limited availability, please call early in hospitalization  [place] ([address]) . [facility]  o Charlene Flemming, MD o [address], [place] [address] o [phone]  Fax [phone]   

## 2019-11-25 NOTE — Progress Notes (Signed)
Subjective:  Monica Munoz is a 29 y.o. G6P5004 at [redacted]w[redacted]d being seen today for ongoing prenatal care.  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  Patient reports pubic symphasis discomfort and back pain.  Contractions: Not present. Vag. Bleeding: None.  Movement: Present. Denies leaking of fluid.   The following portions of the patient's history were reviewed and updated as appropriate: allergies, current medications, past family history, past medical history, past social history, past surgical history and problem list. Problem list updated.  Objective:   Vitals:   11/25/19 1040  BP: 120/79  Pulse: 85  Weight: 164 lb 3.2 oz (74.5 kg)    Fetal Status: Fetal Heart Rate (bpm): 123 Fundal Height: 25 cm Movement: Present     General:  Alert, oriented and cooperative. Patient is in no acute distress.  Skin: Skin is warm and dry. No rash noted.   Cardiovascular: Normal heart rate noted  Respiratory: Normal respiratory effort, no problems with respiration noted  Abdomen: Soft, gravid, appropriate for gestational age. Pain/Pressure: Present     Pelvic:  Cervical exam deferred        Extremities: Normal range of motion.  Edema: None  Mental Status: Normal mood and affect. Normal behavior. Normal judgment and thought content.   Urinalysis:      Assessment and Plan:  Pregnancy: G6P5004 at [redacted]w[redacted]d  1. Supervision of high risk pregnancy, antepartum Pubic symphysis discomfort and declines referral to PT Wants to talk with MD about  delivery plan - in [place], was told she could have C/S with tubal ligation due to PP hemorrhage Counseling re: 2 hr glucola at next visit done - fasting appointment Working on selecting a pediatrician  - Culture, OB Urine  2. Urinary tract infection in mother during second trimester of pregnancy Did not know she had a UTI and did not take medication Will check urine culture again to see if medication is needed - client is asymptomatic  COVID-19 Vaccine Counseling: The patient was counseled on the potential benefits and lack of known risks of COVID vaccination, during pregnancy and breastfeeding, during today's visit. The patient's questions and concerns were addressed today, including safety of the vaccination and potential side effects as they have been published by ACOG and SMFM. The patient has been informed that there have not been any documented vaccine related injuries, deaths or birth defects to infant or mom after receiving the COVID-19 vaccine to date. The patient has been made aware that although she is not at increased risk of contracting COVID-19 during pregnancy, she is at increased risk of developing severe disease and complications if she contracts COVID-19 while pregnant. All patient questions were addressed during our visit today. The patient is still unsure of her decision for vaccination.    Preterm labor symptoms and general obstetric precautions including but not limited to vaginal bleeding, contractions, leaking of fluid and fetal movement were reviewed in detail with the patient. Please refer to After Visit Summary for other counseling recommendations.  Return in about 2 weeks (around 12/09/2019) for MD appt for ROB and delivery plan - early AM for 2 hr glucola,.  Nolene Bernheim, RN, MSN, NP-BC Nurse Practitioner, [facility] for [employer], [facility] 11/25/2019 11:17 AM

## 2019-11-28 LAB — URINE CULTURE, OB REFLEX

## 2019-11-28 LAB — CULTURE, OB URINE

## 2019-12-01 ENCOUNTER — Encounter: Payer: Self-pay | Admitting: Nurse Practitioner

## 2019-12-01 MED ORDER — SULFAMETHOXAZOLE-TRIMETHOPRIM 800-160 MG PO TABS: 1.0000 | ORAL_TABLET | Freq: Two times a day (BID) | ORAL | 0 refills | Status: AC

## 2019-12-01 NOTE — Addendum Note (Signed)
Addended by: Currie Paris on: 12/01/2019 05:57 PM   Modules accepted: Orders

## 2019-12-02 NOTE — Telephone Encounter (Signed)
-----   Message from Currie Paris, NP sent at 12/01/2019  5:57 PM EST ----- Please call patient and let her know she still has a UTI (was never treated previously as she did not know she had it).  Medication sent to her pharmacy.  Have sent her a MyChart message also but want to confirm she has picked up her medicine has started it.

## 2019-12-03 ENCOUNTER — Ambulatory Visit: Payer: Self-pay

## 2019-12-09 ENCOUNTER — Telehealth (INDEPENDENT_AMBULATORY_CARE_PROVIDER_SITE_OTHER): Payer: Self-pay | Admitting: Obstetrics & Gynecology

## 2019-12-09 ENCOUNTER — Other Ambulatory Visit: Payer: Self-pay

## 2019-12-09 DIAGNOSIS — Z8759 Personal history of other complications of pregnancy, childbirth and the puerperium: Secondary | ICD-10-CM

## 2019-12-09 DIAGNOSIS — Z8489 Family history of other specified conditions: Secondary | ICD-10-CM

## 2019-12-09 DIAGNOSIS — J45909 Unspecified asthma, uncomplicated: Secondary | ICD-10-CM

## 2019-12-09 DIAGNOSIS — Z3A28 28 weeks gestation of pregnancy: Secondary | ICD-10-CM

## 2019-12-09 DIAGNOSIS — R102 Pelvic and perineal pain: Secondary | ICD-10-CM | POA: Insufficient documentation

## 2019-12-09 DIAGNOSIS — O26893 Other specified pregnancy related conditions, third trimester: Secondary | ICD-10-CM

## 2019-12-09 DIAGNOSIS — O0990 Supervision of high risk pregnancy, unspecified, unspecified trimester: Secondary | ICD-10-CM

## 2019-12-09 DIAGNOSIS — O09299 Supervision of pregnancy with other poor reproductive or obstetric history, unspecified trimester: Secondary | ICD-10-CM

## 2019-12-09 DIAGNOSIS — O0993 Supervision of high risk pregnancy, unspecified, third trimester: Secondary | ICD-10-CM

## 2019-12-09 DIAGNOSIS — O26899 Other specified pregnancy related conditions, unspecified trimester: Secondary | ICD-10-CM | POA: Insufficient documentation

## 2019-12-09 DIAGNOSIS — F419 Anxiety disorder, unspecified: Secondary | ICD-10-CM

## 2019-12-09 DIAGNOSIS — O99513 Diseases of the respiratory system complicating pregnancy, third trimester: Secondary | ICD-10-CM

## 2019-12-09 DIAGNOSIS — O99343 Other mental disorders complicating pregnancy, third trimester: Secondary | ICD-10-CM

## 2019-12-09 DIAGNOSIS — O09293 Supervision of pregnancy with other poor reproductive or obstetric history, third trimester: Secondary | ICD-10-CM

## 2019-12-09 NOTE — Progress Notes (Signed)
   TELEHEALTH OBSTETRICS VISIT ENCOUNTER NOTE  I connected with Monica Munoz on 12/09/19 at  8:45 AM EST by telephone at home and verified that I am speaking with the correct person using two identifiers.   I discussed the limitations, risks, security and privacy concerns of performing an evaluation and management service by telephone and the availability of in person appointments. I also discussed with the patient that there may be a patient responsible charge related to this service. The patient expressed understanding and agreed to proceed.  Subjective:  Monica Munoz is a 29 y.o. G6P5004 at [redacted]w[redacted]d being followed for ongoing prenatal care.  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  Patient reports pelvic pain. Reports fetal movement. Denies any contractions, bleeding or leaking of fluid.   The following portions of the patient's history were reviewed and updated as appropriate: allergies, current medications, past family history, past medical history, past social history, past surgical history and problem list.   Objective:   General:  Alert, oriented and cooperative.   Mental Status: Normal mood and affect perceived. Normal judgment and thought content.  Rest of physical exam deferred due to type of encounter  Assessment and Plan:  Pregnancy: G6P5004 at [redacted]w[redacted]d 1. Supervision of high risk pregnancy, antepartum Babies with EB last 2 deliveries  2. History of postpartum hemorrhage, currently pregnant   3. Last two children  born with Epidermolysis bullosa Being followed by MFM  4. Pregnancy related symphysis pain, antepartum Considering elective primary CS  Preterm labor symptoms and general obstetric precautions including but not limited to vaginal bleeding, contractions, leaking of fluid and fetal movement were reviewed in detail with the patient.  I discussed the assessment and treatment plan with the patient. The patient was provided an opportunity to ask questions and all were answered. The patient agreed with the plan and demonstrated an understanding of the instructions. The patient was advised to call back or seek an in-person office evaluation/go to [facility] at [facility] for any urgent or concerning symptoms. Please refer to After Visit Summary for other counseling recommendations.   I provided 12 minutes of non-face-to-face time during this encounter.  Return in about 1 week (around 12/16/2019) for needs 2 hr.  Future Appointments  Date Time Provider Department Center  12/22/2019  1:45 PM [facility] NURSE [facility]  12/22/2019  2:00 PM [facility] US1 [facility]  12/30/2019  9:00 AM [facility] LAB [facility] None    Scheryl Darter, MD Center for [employer], [facility]

## 2019-12-09 NOTE — Patient Instructions (Signed)
Cesarean Delivery Cesarean birth, or cesarean delivery, is the surgical delivery of a baby through an incision in the abdomen and the uterus. This may be referred to as a C-section. This procedure may be scheduled ahead of time, or it may be done in an emergency situation. Tell a health care provider about:  Any allergies you have.  All medicines you are taking, including vitamins, herbs, eye drops, creams, and over-the-counter medicines.  Any problems you or family members have had with anesthetic medicines.  Any blood disorders you have.  Any surgeries you have had.  Any medical conditions you have.  Whether you or any members of your family have a history of deep vein thrombosis (DVT) or pulmonary embolism (PE). What are the risks? Generally, this is a safe procedure. However, problems may occur, including:  Infection.  Bleeding.  Allergic reactions to medicines.  Damage to other structures or organs.  Blood clots.  Injury to your baby. What happens before the procedure? General instructions  Follow instructions from your health care provider about eating or drinking restrictions.  If you know that you are going to have a cesarean delivery, do not shave your pubic area. Shaving before the procedure may increase your risk of infection.  Plan to have someone take you home from the hospital.  Ask your health care provider what steps will be taken to prevent infection. These may include: ? Removing hair at the surgery site. ? Washing skin with a germ-killing soap. ? Taking antibiotic medicine.  Depending on the reason for your cesarean delivery, you may have a physical exam or additional testing, such as an ultrasound.  You may have your blood or urine tested. Questions for your health care provider  Ask your health care provider about: ? Changing or stopping your regular medicines. This is especially important if you are taking diabetes medicines or blood  thinners. ? Your pain management plan. This is especially important if you plan to breastfeed your baby. ? How long you will be in the hospital after the procedure. ? Any concerns you may have about receiving blood products, if you need them during the procedure. ? Cord blood banking, if you plan to collect your baby's umbilical cord blood.  You may also want to ask your health care provider: ? Whether you will be able to hold or breastfeed your baby while you are still in the operating room. ? Whether your baby can stay with you immediately after the procedure and during your recovery. ? Whether a family member or a person of your choice can go with you into the operating room and stay with you during the procedure, immediately after the procedure, and during your recovery. What happens during the procedure?   An IV will be inserted into one of your veins.  Fluid and medicines, such as antibiotics, will be given before the surgery.  Fetal monitors will be placed on your abdomen to check your baby's heart rate.  You may be given a special warming gown to wear to keep your temperature stable.  A catheter may be inserted into your bladder through your urethra. This drains your urine during the procedure.  You may be given one or more of the following: ? A medicine to numb the area (local anesthetic). ? A medicine to make you fall asleep (general anesthetic). ? A medicine (regional anesthetic) that is injected into your back or through a small thin tube placed in your back (spinal anesthetic or epidural anesthetic).   This numbs everything below the injection site and allows you to stay awake during your procedure. If this makes you feel nauseous, tell your health care provider. Medicines will be available to help reduce any nausea you may feel.  An incision will be made in your abdomen, and then in your uterus.  If you are awake during your procedure, you may feel tugging and pulling in  your abdomen, but you should not feel pain. If you feel pain, tell your health care provider immediately.  Your baby will be removed from your uterus. You may feel more pressure or pushing while this happens.  Immediately after birth, your baby will be dried and kept warm. You may be able to hold and breastfeed your baby.  The umbilical cord may be clamped and cut during this time. This usually occurs after waiting a period of 1-2 minutes after delivery.  Your placenta will be removed from your uterus.  Your incisions will be closed with stitches (sutures). Staples, skin glue, or adhesive strips may also be applied to the incision in your abdomen.  Bandages (dressings) may be placed over the incision in your abdomen. The procedure may vary among health care providers and hospitals. What happens after the procedure?  Your blood pressure, heart rate, breathing rate, and blood oxygen level will be monitored until you are discharged from the hospital.  You may continue to receive fluids and medicines through an IV.  You will have some pain. Medicines will be available to help control your pain.  To help prevent blood clots: ? You may be given medicines. ? You may have to wear compression stockings or devices. ? You will be encouraged to walk around when you are able.  Hospital staff will encourage and support bonding with your baby. Your hospital may have you and your baby to stay in the same room (rooming in) during your hospital stay to encourage successful bonding and breastfeeding.  You may be encouraged to cough and breathe deeply often. This helps to prevent lung problems.  If you have a catheter draining your urine, it will be removed as soon as possible after your procedure. Summary  Cesarean birth, or cesarean delivery, is the surgical delivery of a baby through an incision in the abdomen and the uterus.  Follow instructions from your health care provider about eating or  drinking restrictions before the procedure.  You will have some pain after the procedure. Medicines will be available to help control your pain.  Hospital staff will encourage and support bonding with your baby after the procedure. Your hospital may have you and your baby to stay in the same room (rooming in) during your hospital stay to encourage successful bonding and breastfeeding. This information is not intended to replace advice given to you by your health care provider. Make sure you discuss any questions you have with your health care provider. Document Revised: 07/09/2017 Document Reviewed: 07/09/2017 Elsevier Patient Education  2020 Elsevier Inc.  

## 2019-12-22 ENCOUNTER — Ambulatory Visit: Payer: MEDICAID | Attending: Obstetrics and Gynecology

## 2019-12-22 ENCOUNTER — Ambulatory Visit: Payer: Self-pay

## 2019-12-24 ENCOUNTER — Other Ambulatory Visit: Payer: Self-pay

## 2019-12-24 ENCOUNTER — Ambulatory Visit (INDEPENDENT_AMBULATORY_CARE_PROVIDER_SITE_OTHER): Payer: Self-pay | Admitting: Obstetrics and Gynecology

## 2019-12-24 ENCOUNTER — Telehealth: Payer: Self-pay | Admitting: Genetic Counselor

## 2019-12-24 ENCOUNTER — Encounter: Payer: Self-pay | Admitting: Obstetrics and Gynecology

## 2019-12-24 VITALS — BP 115/74 | HR 88 | Wt 171.0 lb

## 2019-12-24 DIAGNOSIS — O0990 Supervision of high risk pregnancy, unspecified, unspecified trimester: Secondary | ICD-10-CM

## 2019-12-24 DIAGNOSIS — O26893 Other specified pregnancy related conditions, third trimester: Secondary | ICD-10-CM

## 2019-12-24 DIAGNOSIS — S334XXA Traumatic rupture of symphysis pubis, initial encounter: Secondary | ICD-10-CM | POA: Insufficient documentation

## 2019-12-24 DIAGNOSIS — R102 Pelvic and perineal pain: Secondary | ICD-10-CM

## 2019-12-24 DIAGNOSIS — Z3A30 30 weeks gestation of pregnancy: Secondary | ICD-10-CM

## 2019-12-24 DIAGNOSIS — O26899 Other specified pregnancy related conditions, unspecified trimester: Secondary | ICD-10-CM

## 2019-12-24 DIAGNOSIS — Z8489 Family history of other specified conditions: Secondary | ICD-10-CM

## 2019-12-24 DIAGNOSIS — O24410 Gestational diabetes mellitus in pregnancy, diet controlled: Secondary | ICD-10-CM

## 2019-12-24 DIAGNOSIS — O0993 Supervision of high risk pregnancy, unspecified, third trimester: Secondary | ICD-10-CM

## 2019-12-24 NOTE — Telephone Encounter (Addendum)
ADDENDUM: I have not received a call back from Ms. Wires as of 4:50 PM. I will attempt to call her again tomorrow morning.  ------------------------------------------------------------------------------------------------------------------------------  Attempted to call Ms. Daphine Deutscher after receiving an [employer] from Venia Carbon, NP informing Dr. Judeth Cornfield and I that Ms. 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 Ms. Zwicker partner answered the phone and told me that he is at work and not with her right now. He will be home around 3 pm. I informed him that I was calling to see if they had found a copy of their son's genetic testing report, as we will need that to order testing on an amniocentesis sample if desired. He agreed to let her know and have her call me later this afternoon.  Gershon Crane, MS, [facility] Genetic Counselor

## 2019-12-24 NOTE — Progress Notes (Signed)
PRENATAL VISIT NOTE  Subjective:  Monica Munoz is a 29 y.o. G6P5004 at [redacted]w[redacted]d being seen today for ongoing prenatal care.  She is currently monitored for the following issues for this low-risk pregnancy and has Anxiety; Asthma, mild intermittent; Depression; Headache, migraine; Death of infant; Allergic rhinitis due to pollen; UTI in pregnancy, antepartum, second trimester; History of postpartum hemorrhage, currently pregnant; Family history of genetic disorder; Supervision of high risk pregnancy, antepartum; and Pregnancy related symphysis pain, antepartum on their problem list.  Patient reports no complaints.  Contractions: Not present. Vag. Bleeding: None.  Movement: Present. Denies leaking of fluid.   Patient has concerns today; reports that she had a very traumatic delivery with her previous 51 month old; PP hemorrhage with blood transfusion, significant pain d/t epidural position and symphysis pubis separation. She highly desires primary C/S. She has voiced this and does not feel like her health care providers are listening to her concerns.  She is also concerned that this baby will have epidermolysis bullosa ( 2 other children have it) One of her babies was transferred to a higher level of NICU care after delivery, both babies needed 4+ weeks of NICU care. Discussed again AMNIO as an option to diagnosis this. She is highly interested in this now after further discussion.   The following portions of the patient's history were reviewed and updated as appropriate: allergies, current medications, past family history, past medical history, past social history, past surgical history and problem list.   Objective:   Vitals:   12/24/19 0958  BP: 115/74  Pulse: 88  Weight: 171 lb (77.6 kg)    Fetal Status:   Fundal Height: 29 cm Movement: Present     General:  Alert, oriented and cooperative. Patient is in no acute distress.  Skin: Skin is warm and dry. No rash noted.   Cardiovascular: Normal  heart rate noted  Respiratory: Normal respiratory effort, no problems with respiration noted  Abdomen: Soft, gravid, appropriate for gestational age.  Pain/Pressure: Present     Pelvic: Cervical exam deferred        Extremities: Normal range of motion.  Edema: None  Mental Status: Normal mood and affect. Normal behavior. Normal judgment and thought content.   Assessment and Plan:  Pregnancy: G6P5004 at [redacted]w[redacted]d 1. [redacted] weeks gestation of pregnancy  - Glucose Tolerance, 2 Hours w/1 Hour - HIV Antibody (routine testing w rflx) - RPR - CBC - Culture, OB Urine - Message sent to MFM to schedule amnio. Patient desires at this time.    2. Supervision of high risk pregnancy, antepartum  - Glucose Tolerance, 2 Hours w/1 Hour - HIV Antibody (routine testing w rflx) - RPR - CBC - Culture, OB Urine - Reports trauma with last delivery, reports PP hemorrhage, Her epidural stopped working. She highly desires primary C/s. It was reported to her in [place] by her previous OB  that it is recommended that she have a c/s - Message sent to have C/S scheduled at 39 weeks, MD visit to discuss risks and sign consent.   3. Pregnancy related symphysis pain, antepartum  - Ambulatory referral to Physical Therapy  5. 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  Amnio needed, patient agrees. Message sent to MFM Rolly Salter and Dr. Judeth Cornfield) for further assistance.   Preterm labor symptoms and general obstetric precautions including but not limited to vaginal bleeding, contractions, leaking of fluid and fetal movement were reviewed in detail with the patient. Please refer  to After Visit Summary for other counseling recommendations.   Return in about 2 weeks (around 01/07/2020), or In-person visit with female MD if available, she would like to have C/s and sign consent..  Future Appointments  Date Time Provider Department Center  01/05/2020  4:15 PM Constant, Gigi Gin, MD [facility]  None    Venia Carbon, NP

## 2019-12-24 NOTE — Progress Notes (Signed)
Pt presents for ROB and 2 gtt Pt reports R low back pain radiating down to the back of her R thigh making it difficult to walk and change positions in bed  Needs TOC UCx  BTL forms signed today

## 2019-12-25 ENCOUNTER — Telehealth: Payer: Self-pay | Admitting: Genetic Counselor

## 2019-12-25 ENCOUNTER — Telehealth: Payer: Self-pay | Admitting: Obstetrics and Gynecology

## 2019-12-25 LAB — CBC
Hematocrit: 31.6 % — ABNORMAL LOW (ref 34.0–46.6)
Hemoglobin: 10.8 g/dL — ABNORMAL LOW (ref 11.1–15.9)
MCH: 31.3 pg (ref 26.6–33.0)
MCHC: 34.2 g/dL (ref 31.5–35.7)
MCV: 92 fL (ref 79–97)
Platelets: 340 10*3/uL (ref 150–450)
RBC: 3.45 x10E6/uL — ABNORMAL LOW (ref 3.77–5.28)
RDW: 11.6 % — ABNORMAL LOW (ref 11.7–15.4)
WBC: 16.4 10*3/uL — ABNORMAL HIGH (ref 3.4–10.8)

## 2019-12-25 LAB — GLUCOSE TOLERANCE, 2 HOURS W/ 1HR
Glucose, 1 hour: 191 mg/dL — ABNORMAL HIGH (ref 65–179)
Glucose, 2 hour: 113 mg/dL (ref 65–152)
Glucose, Fasting: 75 mg/dL (ref 65–91)

## 2019-12-25 LAB — HIV ANTIBODY (ROUTINE TESTING W REFLEX): HIV Screen 4th Generation wRfx: NONREACTIVE

## 2019-12-25 LAB — RPR: RPR Ser Ql: NONREACTIVE

## 2019-12-25 NOTE — Telephone Encounter (Signed)
I spoke with Monica Munoz to follow up regarding genetic testing results from her previous children to allow for additional testing in this pregnancy.  The patient had met with Monica Munoz, genetic counselor, earlier in the pregnancy and then this week expressed a desire for testing of herself, partner and possible prenatal diagnosis.  She is currently 30+ weeks pregnant.    I explained that we need documentation of the genetic variant in her children in order to provide additional testing options.  She recently moved and does not have access to those reports at home.  I told her if she could sign a release, we will reach out to [place] to try to obtain the reports and go from there.  She is agreeable to this option, and I told her we would try to email her the form to be returned to [place] via email.  Monica Munoz or myself will follow up with her.  Monica Finlay, MS, CGC

## 2019-12-25 NOTE — Telephone Encounter (Addendum)
ADDENDUM 9:55 AM: As I have to leave for an emergency, my colleague Katrina Stack, MS, CGC will try to call Ms. Enloe this afternoon.  ------------------------------------------------------------------------------------------------------------------------------  I attempted to call Ms. Coppa again after not receiving a call back from her yesterday afternoon. Her husband answered again when I called the number listed in Epic. He again stated that Ms. Harral was not with him, but gave me another phone number to try. I tried this phone number [phone]) which led me to a voicemail for the [facility]. I attempted to leave a general message stating that I was calling for Paytience Bures, that I got this number from her partner, and that it was Rolly Salter the genetic counselor calling from MFM to get some information from her, but was the call was disconnected halfway through my message and I was unable to leave my call back number. Will try again later.  Gershon Crane, MS, [facility] Genetic Counselor

## 2019-12-26 ENCOUNTER — Telehealth: Payer: Self-pay | Admitting: Genetic Counselor

## 2019-12-26 DIAGNOSIS — O24419 Gestational diabetes mellitus in pregnancy, unspecified control: Secondary | ICD-10-CM | POA: Insufficient documentation

## 2019-12-26 NOTE — Telephone Encounter (Addendum)
ADDENDUM 01/02/20: I have not yet received an email from Ms. Flamm with a signed copy of the medical record release form from [facility]. I will call Ms. Crager today to confirm that she has received my email.  ------------------------------------------------------------------------------------------------------------------------------  This morning I spoke with my colleague Gavin Pound after her conversation with Ms. Prothero yesterday. Per Gavin Pound, Ms. Schaafsma was not considering amniocentesis earlier in pregnancy but may consider having the procedure performed now that she is farther along in the pregnancy and risk for miscarriage is lower. Gavin Pound also informed me that Ms. Ator could not find her son's genetic testing report but was willing to fill out a medical record release form to request that his genetic testing results be sent to [place] from [facility]. I emailed Ms. Batty a copy of this release to finish completing and sign and informed her that I would contact the hospital as soon as I receive an email with her signed copy to expedite the process of them releasing Nico's medical records. I informed her that we can then coordinate parental testing to refine recurrence risks for the current fetus as well as investigate laboratories that offer testing for the gene/variant in question on an amniocentesis sample.  Since Ms. Dohn did not respond to my most recent MyChart message, I also emailed her the recommendations I received for physicians in the area who care for children with EB. Per our pediatric genetics team, [facility] pediatric dermatology sees and treats children with EB. Additionally, Dr. Roetta Sessions in pediatric genetics recently connected with a [facility] employee named Donnie Mesa who has EB and has a toddler son with the condition. Pattricia Boss was gracious enough to allow [place] to share her contact information with Ms. Inboden as a local connection for support and recommendations.  Her son sees Dr. Excell Seltzer at [facility] who may not currently be accepting new patients but may make an exception for children with EB. I encouraged Ms. Rice to let me know if she is interested in having me call Dr. Excell Seltzer to see if he would be willing to be Lonzo Cloud and Nova's pediatricians.   I will await Ms. Lavalley's signed copy of the record release form for [facility], then facilitate transfer of records, parental testing, and possible amniocentesis from there.   Gershon Crane, MS, [facility] Genetic Counselor

## 2019-12-28 LAB — URINE CULTURE, OB REFLEX

## 2019-12-28 LAB — CULTURE, OB URINE

## 2019-12-30 ENCOUNTER — Other Ambulatory Visit: Payer: Self-pay

## 2019-12-30 DIAGNOSIS — O24410 Gestational diabetes mellitus in pregnancy, diet controlled: Secondary | ICD-10-CM

## 2019-12-31 ENCOUNTER — Encounter: Payer: Self-pay | Admitting: Obstetrics and Gynecology

## 2019-12-31 ENCOUNTER — Telehealth: Payer: Self-pay | Admitting: Obstetrics and Gynecology

## 2019-12-31 MED ORDER — CEFADROXIL 500 MG PO CAPS: 500.0000 mg | ORAL_CAPSULE | Freq: Two times a day (BID) | ORAL | 0 refills | Status: DC

## 2019-12-31 NOTE — Progress Notes (Signed)
You have a positive urine culture, which means your have a urinary tract infection. I have sent in medication for you to pick up to treat. It is important that you take the medication as prescribed. Please call [phone] if you have any further questions.    Victorino Dike, NP

## 2019-12-31 NOTE — Telephone Encounter (Signed)
+   urine culture  Attempted to call patient to discuss treatment. No answer. VM left to have patient call back to MAU. Will send RX  For treatment.   Duane Lope, NP 12/31/2019 5:42 PM

## 2020-01-02 ENCOUNTER — Telehealth: Payer: Self-pay | Admitting: Genetic Counselor

## 2020-01-02 NOTE — Telephone Encounter (Signed)
Attempted to call Ms. Holstad again to see if she had received my email last Friday as I have not yet received a signed copy of the medical record release form for [facility] from her. Requested that Ms. Weinrich please call back my direct line to let me know if she sent a signed copy of the form back so that I can facilitate testing ASAP, as my email sometimes does not receive messages with attachments due to [facility]'s firewall.   Gershon Crane, MS, [facility] Genetic Counselor

## 2020-01-05 ENCOUNTER — Other Ambulatory Visit: Payer: Self-pay

## 2020-01-05 ENCOUNTER — Encounter (HOSPITAL_COMMUNITY): Payer: Self-pay | Admitting: Anesthesiology

## 2020-01-05 ENCOUNTER — Encounter: Payer: Self-pay | Admitting: Obstetrics and Gynecology

## 2020-01-05 ENCOUNTER — Ambulatory Visit (INDEPENDENT_AMBULATORY_CARE_PROVIDER_SITE_OTHER): Payer: Self-pay | Admitting: Obstetrics and Gynecology

## 2020-01-05 VITALS — BP 134/72 | HR 80 | Temp 98.5°F | Wt 177.2 lb

## 2020-01-05 DIAGNOSIS — O0990 Supervision of high risk pregnancy, unspecified, unspecified trimester: Secondary | ICD-10-CM

## 2020-01-05 DIAGNOSIS — O2343 Unspecified infection of urinary tract in pregnancy, third trimester: Secondary | ICD-10-CM

## 2020-01-05 DIAGNOSIS — Z8489 Family history of other specified conditions: Secondary | ICD-10-CM

## 2020-01-05 DIAGNOSIS — O24410 Gestational diabetes mellitus in pregnancy, diet controlled: Secondary | ICD-10-CM

## 2020-01-05 NOTE — Progress Notes (Signed)
   PRENATAL VISIT NOTE  Subjective:  Monica Munoz is a 29 y.o. G6P5004 at [redacted]w[redacted]d being seen today for ongoing prenatal care.  She is currently monitored for the following issues for this high-risk pregnancy and has Anxiety; Asthma, mild intermittent; Depression; Headache, migraine; Death of infant; Allergic rhinitis due to pollen; UTI in pregnancy, antepartum, third trimester; History of postpartum hemorrhage, currently pregnant; Family history of genetic disorder; Supervision of high risk pregnancy, antepartum; Pregnancy related symphysis pain, antepartum; and Gestational diabetes on their problem list.  Patient reports persistent pelvic pain. She has not been seen by physical therapy yet.  Contractions: Not present. Vag. Bleeding: None.  Movement: Present. Denies leaking of fluid.   The following portions of the patient's history were reviewed and updated as appropriate: allergies, current medications, past family history, past medical history, past social history, past surgical history and problem list.   Objective:   Vitals:   01/05/20 1325  BP: 134/72  Pulse: 80  Temp: 98.5 F (36.9 C)  Weight: 177 lb 3.2 oz (80.4 kg)    Fetal Status: Fetal Heart Rate (bpm): 139 Fundal Height: 32 cm Movement: Present     General:  Alert, oriented and cooperative. Patient is in no acute distress.  Skin: Skin is warm and dry. No rash noted.   Cardiovascular: Normal heart rate noted  Respiratory: Normal respiratory effort, no problems with respiration noted  Abdomen: Soft, gravid, appropriate for gestational age.  Pain/Pressure: Present     Pelvic: Cervical exam deferred        Extremities: Normal range of motion.  Edema: Trace  Mental Status: Normal mood and affect. Normal behavior. Normal judgment and thought content.   Assessment and Plan:  Pregnancy: G6P5004 at [redacted]w[redacted]d 1. Supervision of high risk pregnancy, antepartum Patient is doing well Patient reports a history of difficult epidural  anesthesia and scoliosis. Patient currently scheduled for primary c-section with BTL at 39 weeks. Will coordinate an anesthesia consult prior to surgery. Message sent to Drs Maple Hudson and Tuck  2. Diet controlled gestational diabetes mellitus (GDM) in third trimester New diagnosis and has not had education or started testing Reviewed meaning of diagnosis and importance of euglycemia in pregnancy  3. UTI in pregnancy, antepartum, third trimester Patient has not been treated and plans to pick up prescription today  4. Family history of genetic disorder Is following up with genetic counselor and obtaining records  Preterm labor symptoms and general obstetric precautions including but not limited to vaginal bleeding, contractions, leaking of fluid and fetal movement were reviewed in detail with the patient. Please refer to After Visit Summary for other counseling recommendations.   Return in about 2 weeks (around 01/19/2020) for in person, ROB, High risk.  Future Appointments  Date Time Provider Department Center  01/05/2020  4:15 PM Mena Lienau, Gigi Gin, MD [facility] None  01/14/2020  9:00 AM [facility] GDM CLASS [facility] NDM  01/20/2020  1:40 PM Gerrit Heck, CNM [facility] None    Catalina Antigua, MD

## 2020-01-14 ENCOUNTER — Encounter: Payer: Medicaid Other | Attending: Obstetrics and Gynecology | Admitting: Registered"

## 2020-01-14 ENCOUNTER — Other Ambulatory Visit: Payer: Self-pay

## 2020-01-14 ENCOUNTER — Encounter: Payer: Self-pay | Admitting: Registered"

## 2020-01-14 DIAGNOSIS — O24419 Gestational diabetes mellitus in pregnancy, unspecified control: Secondary | ICD-10-CM | POA: Diagnosis not present

## 2020-01-14 MED ORDER — GLUCOSE BLOOD VI STRP: ORAL_STRIP | 12 refills | Status: DC

## 2020-01-14 MED ORDER — ACCU-CHEK AVIVA PLUS W/DEVICE KIT: 1.0000 | PACK | Freq: Once | 0 refills | Status: AC

## 2020-01-14 MED ORDER — ACCU-CHEK SOFTCLIX LANCETS MISC: 12 refills | Status: DC

## 2020-01-14 NOTE — Progress Notes (Signed)
Patient was seen on 01/14/20 for Gestational Diabetes self-management class [facility]. The following learning objectives were met by the patient during this course:   States the definition of Gestational Diabetes  States why dietary management is important in controlling blood glucose  Describes the effects each nutrient has on blood glucose levels  Demonstrates ability to create a balanced meal plan  Demonstrates carbohydrate counting   States when to check blood glucose levels  Demonstrates proper blood glucose monitoring techniques  States the effect of stress and exercise on blood glucose levels  States the importance of limiting caffeine and abstaining from alcohol and smoking  Blood glucose monitor given: [employer] #[id number] Exp: 02/15/2021 CBG: 84 mg/dL  Patient instructed to monitor glucose levels: FBS: 60 - <95; 1 hour: <140; 2 hour: <120  Patient received handouts:  Nutrition Diabetes and Pregnancy, including carb counting list  Patient will be seen for follow-up as needed.

## 2020-02-06 NOTE — Patient Instructions (Addendum)
Monica Munoz  02/06/2020   Your procedure is scheduled on:  02/23/2020  Arrive at 0730 at [employer] [facility] on [address] at [facility]  and [employer]. You are invited to use the FREE valet parking or use the Visitor's parking deck.  Pick up the phone at the desk and dial [phone].  Call this number if you have problems the morning of surgery: [phone]  Remember:   Do not eat food:(After Midnight) Desps de medianoche.  Do not drink clear liquids: (After Midnight) Desps de medianoche.  Take these medicines the morning of surgery with A SIP OF WATER:  Bring your inhaler   Do not wear jewelry, make-up or nail polish.  Do not wear lotions, powders, or perfumes. Do not wear deodorant.  Do not shave 48 hours prior to surgery.  Do not bring valuables to the hospital.  [facility] is not   responsible for any belongings or valuables brought to the hospital.  Contacts, dentures or bridgework may not be worn into surgery.  Leave suitcase in the car. After surgery it may be brought to your room.  For patients admitted to the hospital, checkout time is 11:00 AM the day of              discharge.      Please read over the following fact sheets that you were given:     Preparing for Surgery

## 2020-02-09 ENCOUNTER — Other Ambulatory Visit (HOSPITAL_COMMUNITY)
Admission: RE | Admit: 2020-02-09 | Discharge: 2020-02-09 | Disposition: A | Discharge status: Home / Self Care | Payer: Medicaid Other | Source: Ambulatory Visit

## 2020-02-09 ENCOUNTER — Ambulatory Visit (INDEPENDENT_AMBULATORY_CARE_PROVIDER_SITE_OTHER): Payer: Medicaid Other | Admitting: Advanced Practice Midwife

## 2020-02-09 ENCOUNTER — Other Ambulatory Visit: Payer: Self-pay

## 2020-02-09 VITALS — BP 120/73 | HR 71 | Wt 174.0 lb

## 2020-02-09 DIAGNOSIS — O0993 Supervision of high risk pregnancy, unspecified, third trimester: Secondary | ICD-10-CM | POA: Insufficient documentation

## 2020-02-09 DIAGNOSIS — O24410 Gestational diabetes mellitus in pregnancy, diet controlled: Secondary | ICD-10-CM

## 2020-02-09 DIAGNOSIS — O0990 Supervision of high risk pregnancy, unspecified, unspecified trimester: Secondary | ICD-10-CM

## 2020-02-09 DIAGNOSIS — M7918 Myalgia, other site: Secondary | ICD-10-CM

## 2020-02-09 DIAGNOSIS — Z3A37 37 weeks gestation of pregnancy: Secondary | ICD-10-CM

## 2020-02-09 DIAGNOSIS — O99891 Other specified diseases and conditions complicating pregnancy: Secondary | ICD-10-CM

## 2020-02-09 DIAGNOSIS — O26893 Other specified pregnancy related conditions, third trimester: Secondary | ICD-10-CM | POA: Insufficient documentation

## 2020-02-09 NOTE — Patient Instructions (Addendum)
Labor Precautions Reasons to come to MAU at [facility]:  1.  Contractions are  5 minutes apart or less, each last 1 minute, these have been going on for 1-2 hours, and you cannot walk or talk during them 2.  You have a large gush of fluid, or a trickle of fluid that will not stop and you have to wear a pad 3.  You have bleeding that is bright red, heavier than spotting--like menstrual bleeding (spotting can be normal in early labor or after a check of your cervix) 4.  You do not feel the baby moving like he/she normally does  

## 2020-02-09 NOTE — Progress Notes (Signed)
ROB 37wk   *GBS needs to be done today.    CC: None

## 2020-02-09 NOTE — Progress Notes (Signed)
   PRENATAL VISIT NOTE  Subjective:  Monica Munoz is a 30 y.o. G6P5004 at [redacted]w[redacted]d being seen today for ongoing prenatal care.  She is currently monitored for the following issues for this high-risk pregnancy and has Anxiety; Asthma, mild intermittent; Depression; Headache, migraine; Death of infant; Allergic rhinitis due to pollen; UTI in pregnancy, antepartum, third trimester; History of postpartum hemorrhage, currently pregnant; Family history of genetic disorder; Supervision of high risk pregnancy, antepartum; Pregnancy related symphysis pain, antepartum; and Gestational diabetes on their problem list.  Patient reports pelvic pain.  Contractions: Not present. Vag. Bleeding: None.  Movement: Present. Denies leaking of fluid.   The following portions of the patient's history were reviewed and updated as appropriate: allergies, current medications, past family history, past medical history, past social history, past surgical history and problem list.   Objective:   Vitals:   02/09/20 1437  Weight: 174 lb (78.9 kg)    Fetal Status:     Movement: Present     General:  Alert, oriented and cooperative. Patient is in no acute distress.  Skin: Skin is warm and dry. No rash noted.   Cardiovascular: Normal heart rate noted  Respiratory: Normal respiratory effort, no problems with respiration noted  Abdomen: Soft, gravid, appropriate for gestational age.  Pain/Pressure: Present     Pelvic: Cervical exam deferred        Extremities: Normal range of motion.  Edema: Trace  Mental Status: Normal mood and affect. Normal behavior. Normal judgment and thought content.   Assessment and Plan:  Pregnancy: G6P5004 at [redacted]w[redacted]d  1. [redacted] weeks gestation of pregnancy  - Strep Gp B NAA - Cervicovaginal ancillary only( [place])  2. Supervision of high risk pregnancy, antepartum --Anticipatory guidance about next visits/weeks of pregnancy given. --Next visit in 2 weeks  3. Diet controlled gestational  diabetes mellitus (GDM) in third trimester --Fastings and PP all wnl per pt  4. Pain in symphysis pubis during pregnancy --Rest/ice/heat/warm bath/Tylenol/pregnancy support belt  - Ambulatory referral to Physical Therapy  Term labor symptoms and general obstetric precautions including but not limited to vaginal bleeding, contractions, leaking of fluid and fetal movement were reviewed in detail with the patient. Please refer to After Visit Summary for other counseling recommendations.   No follow-ups on file.  Future Appointments  Date Time Provider Department Center  02/20/2020  9:00 AM [facility] PAT 1 [facility] None    Sharen Counter, CNM

## 2020-02-10 LAB — CERVICOVAGINAL ANCILLARY ONLY
Bacterial Vaginitis (gardnerella): POSITIVE — AB
Candida Glabrata: NEGATIVE
Candida Vaginitis: POSITIVE — AB
Chlamydia: NEGATIVE
Comment: NEGATIVE
Comment: NEGATIVE
Comment: NEGATIVE
Comment: NEGATIVE
Comment: NEGATIVE
Comment: NORMAL
Neisseria Gonorrhea: NEGATIVE
Trichomonas: NEGATIVE

## 2020-02-11 LAB — STREP GP B NAA: Strep Gp B NAA: NEGATIVE

## 2020-02-16 ENCOUNTER — Encounter: Payer: Medicaid Other | Admitting: Obstetrics and Gynecology

## 2020-02-16 ENCOUNTER — Encounter: Payer: Medicaid Other | Admitting: Advanced Practice Midwife

## 2020-02-17 ENCOUNTER — Other Ambulatory Visit: Payer: Self-pay | Admitting: Advanced Practice Midwife

## 2020-02-17 ENCOUNTER — Telehealth: Payer: Self-pay | Admitting: Family Medicine

## 2020-02-17 ENCOUNTER — Encounter: Payer: Self-pay | Admitting: Family Medicine

## 2020-02-17 NOTE — Telephone Encounter (Signed)
Called patient to discuss scheduled primary cesarean next week. Expressed my concerns to her about this plan and asked her to share her perspective on her prior births and concerns about this delivery.  During her last delivery her epidural stopped working during her labor and she felt she was unable to push, and then had a negative experience with the midwife who was attending the delivery. She felt as if the baby was coming through her rather than out of her and said it was extremely painful. She also reports having lots of bleeding after her last delivery and required 2u pRBC. Of her five deliveries she states the epidural has only worked well twice.   I thanked the patient for sharing her experiences and validated that these were very scary. However, I expressed concern about doing a cesarean for several reasons. First I am worried that this will lead to more pain not less in the long term. Secondly she is at increased risk of hemorrhage with a surgical procedure, not less.   Given these concerns I suggested instead that if pain control is her primary concern we can get her an early epidural and ensure it is working before progressing very far in her induction. I assured her that I felt confident our anesthesiologists would be able to get her comfortable. If we continue to have issues I told her I would be open to going the route of a spinal/cesarean, but not without trying for a vaginal delivery first which would be safer and better for her health in the long run.   Patient agreed to this plan. Also had questions regarding post partum tubal ligation, I reviewed that this is a much simpler procedure that we can hopefully do using her epidural catheter, and is much less morbid than a PCS+BTL.   Patient in agreement with plan. Message sent to L&D charge nurse, CS changed to IOL for the same morning, indication: elective at 39 weeks.

## 2020-02-18 ENCOUNTER — Other Ambulatory Visit: Payer: Self-pay | Admitting: Advanced Practice Midwife

## 2020-02-18 ENCOUNTER — Telehealth (HOSPITAL_COMMUNITY): Payer: Self-pay | Admitting: *Deleted

## 2020-02-18 ENCOUNTER — Encounter (HOSPITAL_COMMUNITY): Payer: Self-pay

## 2020-02-18 NOTE — Telephone Encounter (Signed)
Preadmission screen  

## 2020-02-19 ENCOUNTER — Other Ambulatory Visit: Payer: Self-pay | Admitting: Family Medicine

## 2020-02-19 ENCOUNTER — Telehealth (HOSPITAL_COMMUNITY): Payer: Self-pay | Admitting: *Deleted

## 2020-02-19 ENCOUNTER — Encounter (HOSPITAL_COMMUNITY): Payer: Self-pay | Admitting: *Deleted

## 2020-02-19 NOTE — Telephone Encounter (Signed)
Preadmission screen  

## 2020-02-20 ENCOUNTER — Other Ambulatory Visit (HOSPITAL_COMMUNITY)
Admission: RE | Admit: 2020-02-20 | Discharge: 2020-02-20 | Disposition: A | Discharge status: Home / Self Care | Payer: Medicaid Other | Source: Ambulatory Visit | Attending: Family Medicine | Admitting: Family Medicine

## 2020-02-20 ENCOUNTER — Encounter (HOSPITAL_COMMUNITY)
Admission: RE | Admit: 2020-02-20 | Discharge: 2020-02-20 | Disposition: A | Discharge status: Home / Self Care | Payer: Medicaid Other | Source: Ambulatory Visit | Attending: Family Medicine | Admitting: Family Medicine

## 2020-02-20 DIAGNOSIS — Z01812 Encounter for preprocedural laboratory examination: Secondary | ICD-10-CM | POA: Insufficient documentation

## 2020-02-20 DIAGNOSIS — Z20822 Contact with and (suspected) exposure to covid-19: Secondary | ICD-10-CM | POA: Diagnosis not present

## 2020-02-20 LAB — SARS CORONAVIRUS 2 (TAT 6-24 HRS): SARS Coronavirus 2: NEGATIVE

## 2020-02-23 ENCOUNTER — Inpatient Hospital Stay (HOSPITAL_COMMUNITY): Admission: RE | Admit: 2020-02-23 | Payer: Medicaid Other | Source: Home / Self Care | Admitting: Family Medicine

## 2020-02-23 ENCOUNTER — Encounter (HOSPITAL_COMMUNITY): Payer: Self-pay | Admitting: Family Medicine

## 2020-02-23 ENCOUNTER — Other Ambulatory Visit: Payer: Self-pay

## 2020-02-23 ENCOUNTER — Inpatient Hospital Stay (HOSPITAL_COMMUNITY): Payer: Medicaid Other

## 2020-02-23 ENCOUNTER — Inpatient Hospital Stay (HOSPITAL_COMMUNITY): Payer: Medicaid Other | Admitting: Anesthesiology

## 2020-02-23 ENCOUNTER — Inpatient Hospital Stay (HOSPITAL_COMMUNITY)
Admission: AD | Admit: 2020-02-23 | Discharge: 2020-02-25 | DRG: 798 | Disposition: A | Discharge status: Home / Self Care | Payer: Medicaid Other | Attending: Family Medicine | Admitting: Family Medicine

## 2020-02-23 DIAGNOSIS — O26893 Other specified pregnancy related conditions, third trimester: Secondary | ICD-10-CM | POA: Diagnosis present

## 2020-02-23 DIAGNOSIS — Z302 Encounter for sterilization: Secondary | ICD-10-CM | POA: Diagnosis not present

## 2020-02-23 DIAGNOSIS — O09299 Supervision of pregnancy with other poor reproductive or obstetric history, unspecified trimester: Secondary | ICD-10-CM

## 2020-02-23 DIAGNOSIS — Z9079 Acquired absence of other genital organ(s): Secondary | ICD-10-CM

## 2020-02-23 DIAGNOSIS — Z20822 Contact with and (suspected) exposure to covid-19: Secondary | ICD-10-CM | POA: Diagnosis present

## 2020-02-23 DIAGNOSIS — O4202 Full-term premature rupture of membranes, onset of labor within 24 hours of rupture: Secondary | ICD-10-CM

## 2020-02-23 DIAGNOSIS — Z3A39 39 weeks gestation of pregnancy: Secondary | ICD-10-CM

## 2020-02-23 DIAGNOSIS — O24420 Gestational diabetes mellitus in childbirth, diet controlled: Secondary | ICD-10-CM | POA: Diagnosis present

## 2020-02-23 DIAGNOSIS — Z3490 Encounter for supervision of normal pregnancy, unspecified, unspecified trimester: Secondary | ICD-10-CM | POA: Diagnosis present

## 2020-02-23 DIAGNOSIS — Z8489 Family history of other specified conditions: Secondary | ICD-10-CM

## 2020-02-23 DIAGNOSIS — O2672 Subluxation of symphysis (pubis) in childbirth: Secondary | ICD-10-CM

## 2020-02-23 DIAGNOSIS — O9952 Diseases of the respiratory system complicating childbirth: Secondary | ICD-10-CM | POA: Diagnosis present

## 2020-02-23 DIAGNOSIS — O0990 Supervision of high risk pregnancy, unspecified, unspecified trimester: Secondary | ICD-10-CM

## 2020-02-23 DIAGNOSIS — O24419 Gestational diabetes mellitus in pregnancy, unspecified control: Secondary | ICD-10-CM | POA: Diagnosis present

## 2020-02-23 DIAGNOSIS — J4520 Mild intermittent asthma, uncomplicated: Secondary | ICD-10-CM | POA: Diagnosis present

## 2020-02-23 DIAGNOSIS — O26899 Other specified pregnancy related conditions, unspecified trimester: Secondary | ICD-10-CM | POA: Diagnosis present

## 2020-02-23 DIAGNOSIS — R102 Pelvic and perineal pain: Secondary | ICD-10-CM | POA: Diagnosis present

## 2020-02-23 HISTORY — DX: Gestational diabetes mellitus in pregnancy, unspecified control: O24.419

## 2020-02-23 LAB — CBC
HCT: 30.5 % — ABNORMAL LOW (ref 36.0–46.0)
Hemoglobin: 10 g/dL — ABNORMAL LOW (ref 12.0–15.0)
MCH: 29.7 pg (ref 26.0–34.0)
MCHC: 32.8 g/dL (ref 30.0–36.0)
MCV: 90.5 fL (ref 80.0–100.0)
Platelets: 300 10*3/uL (ref 150–400)
RBC: 3.37 MIL/uL — ABNORMAL LOW (ref 3.87–5.11)
RDW: 13.9 % (ref 11.5–15.5)
WBC: 13.5 10*3/uL — ABNORMAL HIGH (ref 4.0–10.5)
nRBC: 0 % (ref 0.0–0.2)

## 2020-02-23 LAB — GLUCOSE, CAPILLARY
Glucose-Capillary: 62 mg/dL — ABNORMAL LOW (ref 70–99)
Glucose-Capillary: 68 mg/dL — ABNORMAL LOW (ref 70–99)
Glucose-Capillary: 77 mg/dL (ref 70–99)

## 2020-02-23 LAB — TYPE AND SCREEN
ABO/RH(D): A POS
Antibody Screen: NEGATIVE

## 2020-02-23 SURGERY — Surgical Case
Anesthesia: Choice | Laterality: Bilateral

## 2020-02-23 MED ORDER — LACTATED RINGERS IV SOLN: 500.0000 mL | Freq: Once | INTRAVENOUS | Status: DC

## 2020-02-23 MED ORDER — SOD CITRATE-CITRIC ACID 500-334 MG/5ML PO SOLN: 30.0000 mL | ORAL | Status: DC | PRN

## 2020-02-23 MED ORDER — SIMETHICONE 80 MG PO CHEW: 80.0000 mg | CHEWABLE_TABLET | ORAL | Status: DC | PRN

## 2020-02-23 MED ORDER — TETANUS-DIPHTH-ACELL PERTUSSIS 5-2.5-18.5 LF-MCG/0.5 IM SUSY: 0.5000 mL | PREFILLED_SYRINGE | Freq: Once | INTRAMUSCULAR | Status: DC

## 2020-02-23 MED ORDER — DIBUCAINE (PERIANAL) 1 % EX OINT: 1.0000 "application " | TOPICAL_OINTMENT | CUTANEOUS | Status: DC | PRN

## 2020-02-23 MED ORDER — WITCH HAZEL-GLYCERIN EX PADS: 1.0000 "application " | MEDICATED_PAD | CUTANEOUS | Status: DC | PRN

## 2020-02-23 MED ORDER — FENTANYL CITRATE (PF) 100 MCG/2ML IJ SOLN: 50.0000 ug | INTRAMUSCULAR | Status: DC | PRN

## 2020-02-23 MED ORDER — EPHEDRINE 5 MG/ML INJ: 10.0000 mg | INTRAVENOUS | Status: DC | PRN

## 2020-02-23 MED ORDER — SENNOSIDES-DOCUSATE SODIUM 8.6-50 MG PO TABS
2.0000 | ORAL_TABLET | ORAL | Status: DC
  Administered 2020-02-23 – 2020-02-24 (×2): 2 via ORAL
  Filled 2020-02-23 (×2): qty 2

## 2020-02-23 MED ORDER — ONDANSETRON HCL 4 MG/2ML IJ SOLN
4.0000 mg | Freq: Four times a day (QID) | INTRAMUSCULAR | Status: DC | PRN
  Administered 2020-02-23: 4 mg via INTRAVENOUS
  Filled 2020-02-23: qty 2

## 2020-02-23 MED ORDER — ONDANSETRON HCL 4 MG/2ML IJ SOLN: 4.0000 mg | INTRAMUSCULAR | Status: DC | PRN

## 2020-02-23 MED ORDER — DIPHENHYDRAMINE HCL 50 MG/ML IJ SOLN: 12.5000 mg | INTRAMUSCULAR | Status: DC | PRN

## 2020-02-23 MED ORDER — ACETAMINOPHEN 325 MG PO TABS: 650.0000 mg | ORAL_TABLET | ORAL | Status: DC | PRN

## 2020-02-23 MED ORDER — LACTATED RINGERS IV SOLN: 500.0000 mL | INTRAVENOUS | Status: DC | PRN

## 2020-02-23 MED ORDER — LIDOCAINE HCL (PF) 1 % IJ SOLN: 30.0000 mL | INTRAMUSCULAR | Status: DC | PRN

## 2020-02-23 MED ORDER — BUPIVACAINE HCL (PF) 0.25 % IJ SOLN
INTRAMUSCULAR | Status: DC | PRN
  Administered 2020-02-23: 5 mL via EPIDURAL

## 2020-02-23 MED ORDER — FAMOTIDINE 20 MG PO TABS
10.0000 mg | ORAL_TABLET | Freq: Two times a day (BID) | ORAL | Status: DC
  Administered 2020-02-23: 10 mg via ORAL
  Filled 2020-02-23: qty 1

## 2020-02-23 MED ORDER — TERBUTALINE SULFATE 1 MG/ML IJ SOLN: 0.2500 mg | Freq: Once | INTRAMUSCULAR | Status: DC | PRN

## 2020-02-23 MED ORDER — FENTANYL-BUPIVACAINE-NACL 0.5-0.125-0.9 MG/250ML-% EP SOLN
EPIDURAL | Status: AC
  Filled 2020-02-23: qty 250

## 2020-02-23 MED ORDER — FENTANYL-BUPIVACAINE-NACL 0.5-0.125-0.9 MG/250ML-% EP SOLN
12.0000 mL/h | EPIDURAL | Status: DC | PRN
  Administered 2020-02-23: 12 mL/h via EPIDURAL

## 2020-02-23 MED ORDER — MISOPROSTOL 200 MCG PO TABS: 800.0000 ug | ORAL_TABLET | Freq: Once | ORAL | Status: AC

## 2020-02-23 MED ORDER — PHENYLEPHRINE 40 MCG/ML (10ML) SYRINGE FOR IV PUSH (FOR BLOOD PRESSURE SUPPORT): 80.0000 ug | PREFILLED_SYRINGE | INTRAVENOUS | Status: DC | PRN

## 2020-02-23 MED ORDER — COCONUT OIL OIL
1.0000 | TOPICAL_OIL | Status: DC | PRN
Start: 2020-02-23 — End: 2020-02-25

## 2020-02-23 MED ORDER — LIDOCAINE HCL (PF) 1 % IJ SOLN
INTRAMUSCULAR | Status: DC | PRN
  Administered 2020-02-23 (×2): 4 mL via EPIDURAL

## 2020-02-23 MED ORDER — LACTATED RINGERS IV SOLN: INTRAVENOUS | Status: DC

## 2020-02-23 MED ORDER — PRENATAL MULTIVITAMIN CH
1.0000 | ORAL_TABLET | Freq: Every day | ORAL | Status: DC
  Administered 2020-02-25: 1 via ORAL
  Filled 2020-02-23: qty 1

## 2020-02-23 MED ORDER — IBUPROFEN 600 MG PO TABS
600.0000 mg | ORAL_TABLET | Freq: Four times a day (QID) | ORAL | Status: DC
  Administered 2020-02-23 – 2020-02-25 (×5): 600 mg via ORAL
  Filled 2020-02-23 (×5): qty 1

## 2020-02-23 MED ORDER — ONDANSETRON HCL 4 MG PO TABS: 4.0000 mg | ORAL_TABLET | ORAL | Status: DC | PRN

## 2020-02-23 MED ORDER — BENZOCAINE-MENTHOL 20-0.5 % EX AERO
1.0000 "application " | INHALATION_SPRAY | CUTANEOUS | Status: DC | PRN
  Administered 2020-02-24: 1 via TOPICAL
  Filled 2020-02-23: qty 56

## 2020-02-23 MED ORDER — OXYTOCIN-SODIUM CHLORIDE 30-0.9 UT/500ML-% IV SOLN
1.0000 m[IU]/min | INTRAVENOUS | Status: DC
  Administered 2020-02-23: 2 m[IU]/min via INTRAVENOUS
  Filled 2020-02-23: qty 500

## 2020-02-23 MED ORDER — OXYTOCIN-SODIUM CHLORIDE 30-0.9 UT/500ML-% IV SOLN
2.5000 [IU]/h | INTRAVENOUS | Status: DC
  Administered 2020-02-23: 2.5 [IU]/h via INTRAVENOUS

## 2020-02-23 MED ORDER — OXYTOCIN BOLUS FROM INFUSION
333.0000 mL | Freq: Once | INTRAVENOUS | Status: AC
  Administered 2020-02-23: 333 mL via INTRAVENOUS

## 2020-02-23 MED ORDER — MISOPROSTOL 200 MCG PO TABS
ORAL_TABLET | ORAL | Status: AC
  Administered 2020-02-23: 800 ug via RECTAL
  Filled 2020-02-23: qty 4

## 2020-02-23 NOTE — Progress Notes (Signed)
Labor Progress Note Monica Munoz is a 30 y.o. [id number] at [redacted]w[redacted]d presented for elective IOL at term.  S: Patient is comfortable, just bolused her epidural. Not wanting  Feeling some pressure in her back, just recently checked by the RN.  O:  BP 117/77   Pulse 99   Temp 98.9 F (37.2 C) (Oral)   Resp 18   LMP 05/26/2019   SpO2 99%  EFM: 115 bpm/moderate/+ accels, no decels Contractions every 2-3 minutes  CVE: Dilation: 7 Effacement (%): 60 Cervical Position: Posterior Station: Ballotable Presentation: Vertex Exam by:: Henderson Newcomer, RN   A&P: 30 y.o. [id number] [redacted]w[redacted]d for IOL for pubic symphysis separation. #Labor: Progressing well. SVE most recently by RN 7/60/-2, ballotable. Pitocin at 8. #Pain: epidural in place, comfortable #FWB: cat I, reassuring #GBS negative  #Pubic symphysis separation  #H/o PPH: In previous delivery, required blood transfusion. plan for TXA after delivery. Pt with history of asthma- avoid Hemabate. #GDMA1: CBG q4h latent labor, q2h active labor. Most recent CBG 62, 68.  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 We do not have Peds Dermatology available at this hospital per discussion with NICU.   Billey Co, MD Center for [facility], [facility] 5:54 PM

## 2020-02-23 NOTE — H&P (Signed)
LABOR AND DELIVERY ADMISSION HISTORY AND PHYSICAL NOTE  Jayanna Kroeger is a 30 y.o. female [phone] with IUP at [redacted]w[redacted]d by LMP c/w 23wk [place] presenting for elective IOL at term.   She reports positive fetal movement. She denies leakage of fluid, vaginal bleeding, have been having some intermittent contractions.   She plans on breast and bottle feeding. Her contraception plan is: BTL.  Prenatal History/Complications: PNC at [facility]:  @[redacted]w[redacted]d , CWD, normal anatomy, transverse presentation, right lateral placenta, 29%ile, EFW 554g  Pregnancy complications:  - pubic symphysis separation - GDMA1  Past Medical History: Past Medical History:  Diagnosis Date  . Anxiety   . Asthma   . Blood transfusion without reported diagnosis   . Borderline personality disorder (HCC)   . Depression   . 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 Gestational diabetes   . History of postpartum hemorrhage   . Manic depression (HCC)   . Migraine with aura 04/25/2012  . Pelvic inflammatory disease 04/25/2015    Past Surgical History: Past Surgical History:  Procedure Laterality Date  . WISDOM TOOTH EXTRACTION      Obstetrical History: OB History    Gravida  6   Para  5   Term  5   Preterm  0   AB  0   Living  4     SAB  0   IAB  0   Ectopic  0   Multiple  0   Live Births  5        Obstetric Comments  Deceased at 11 months from cyst in throat.         Social History: Social History   Socioeconomic History  . Marital status: Significant Other    Spouse name: Not on file  . Number of children: Not on file  . Years of education: Not on file  . Highest education level: Not on file  Occupational History  . Not on file  Tobacco Use  . Smoking status: Never Smoker  . Smokeless tobacco: Never Used  Substance and Sexual Activity  . Alcohol use: No  . Drug use: Yes    Types: Marijuana  . Sexual activity: Yes    Birth control/protection: None  Other Topics Concern  . Not on  file  Social History Narrative  . Not on file   Social Determinants of Health   Financial Resource Strain: Not on file  Food Insecurity: Not on file  Transportation Needs: Not on file  Physical Activity: Not on file  Stress: Not on file  Social Connections: Not on file    Family History: Family History  Problem Relation Age of Onset  . Diabetes Maternal Grandfather   . Hyperlipidemia Maternal Grandfather   . Stroke Maternal Grandfather   . Cancer Maternal Grandfather   . Hypertension Maternal Grandfather   . Heart disease Maternal Grandfather   . Heart murmur Mother   . Miscarriages / 06/25/2015 Mother   . Diabetes Mother   . Stroke Mother   . Lupus Mother   . Heart disease Father     Allergies: Allergies  Allergen Reactions  . Benadryl [Diphenhydramine Hcl] Other (See Comments)    Reaction:  Red Man Syndrome   . Reglan [Metoclopramide] Shortness Of Breath    Chest pain, face numb and red  . Lorazepam Other (See Comments)    Pt states that this med makes her feel "loopy".   . Vancomycin Itching  . Adhesive [Tape] Itching and Dermatitis  Medications Prior to Admission  Medication Sig Dispense Refill Last Dose  . Accu-Chek Softclix Lancets lancets Please check blood sugar 4 times a day 100 each 12   . albuterol (VENTOLIN HFA) 108 (90 Base) MCG/ACT inhaler Inhale 2 puffs into the lungs 4 (four) times daily as needed for wheezing or shortness of breath.     . cefadroxil (DURICEF) 500 MG capsule Take 1 capsule (500 mg total) by mouth 2 (two) times daily. (Patient not taking: No sig reported) 14 capsule 0   . glucose blood test strip Use as instructed 100 each 12   . Prenatal Vit-Fe Fumarate-FA (PRENATAL MULTIVITAMIN) TABS tablet Take 1 tablet by mouth at bedtime.         Review of Systems  All systems reviewed and negative except as stated in HPI  Physical Exam Last menstrual period 05/26/2019, unknown if currently breastfeeding. General appearance: alert,  oriented, NAD Lungs: normal respiratory effort Heart: regular rate Abdomen: soft, non-tender; gravid, leopolds 3000g Extremities: No calf swelling or tenderness Presentation: cephalic by SVE  Fetal monitoringBaseline: 110 bpm, Variability: Good {> 6 bpm), Accelerations: Reactive and Decelerations: Absent Uterine activity: rare contractions     Prenatal labs: ABO, Rh: A/Positive/-- (06/28 1123) Antibody: Negative (06/28 1123) Rubella: Immune (06/28 1123) RPR: Non Reactive (12/08 1138)  HBsAg: Negative (06/28 1123)  HIV: Non Reactive (12/08 1138)  GC/Chlamydia: neg/neg 02/09/2020  GBS: Negative/-- (01/24 0317)  2-hr GTT: abnormal Genetic screening:  Low risk panorama Anatomy US: normal  Prenatal Transfer Tool  Maternal Diabetes: Yes:  Diabetes Type:  Diet controlled Genetic Screening: Normal Maternal Ultrasounds/Referrals: Normal Fetal Ultrasounds or other Referrals:  None Maternal Substance Abuse:  No Significant Maternal Medications:  None Significant Maternal Lab Results: Group B Strep negative  Results for orders placed or performed during the hospital encounter of 02/23/20 (from the past 24 hour(s))  CBC   Collection Time: 02/23/20 12:10 PM  Result Value Ref Range   WBC 13.5 (H) 4.0 - 10.5 K/uL   RBC 3.37 (L) 3.87 - 5.11 MIL/uL   Hemoglobin 10.0 (L) 12.0 - 15.0 g/dL   HCT 85.6 (L) 31.4 - 97.0 %   MCV 90.5 80.0 - 100.0 fL   MCH 29.7 26.0 - 34.0 pg   MCHC 32.8 30.0 - 36.0 g/dL   RDW 26.3 78.5 - 88.5 %   Platelets 300 150 - 400 K/uL   nRBC 0.0 0.0 - 0.2 %    Patient Active Problem List   Diagnosis Date Noted  . Encounter for elective induction of labor 02/23/2020  . Gestational diabetes 12/26/2019  . Pregnancy related symphysis pain, antepartum 12/09/2019  . History of postpartum hemorrhage, currently pregnant 10/28/2019  . Family history of genetic disorder 10/28/2019  . Supervision of high risk pregnancy, antepartum 10/28/2019  . UTI in pregnancy,  antepartum, third trimester 10/25/2019  . Allergic rhinitis due to pollen 2016/12/01  . Death of infant 04-15-16  . Anxiety 05/26/2013  . Asthma, mild intermittent 05/26/2013  . Depression 05/26/2013  . Headache, migraine 01/29/2012    Assessment: Yalena Colon is a 30 y.o. G6P5004 at [redacted]w[redacted]d here for elective IOL at term 2/2 pubic symphysis pain.   #Labor: Cooks FB placed with 60cc, will start pitocin and reassess for AROM once FB is out.  #Pain: IV pain meds PRN, epidural upon request. Previously discussed with patient she can have early epidural given her concerns about pain control and previous poor experience with non-functioning epidurals late in labor. 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 We do not have Peds Dermatology available at this hospital per discussion with NICU.   Mary Sella [facility] 02/23/2020, 12:53 PM

## 2020-02-23 NOTE — Progress Notes (Signed)
Hypoglycemic Event  CBG:62 @ 1721  Treatment: 2oz apple juice  Symptoms: none  Follow-up CBG: 68 @ 1802   Treatment: 2oz apple juice  Symptoms: none   Follow-up CBG: 77 @ 1802  Comments/MD notified: Dr. Marty Heck A

## 2020-02-23 NOTE — Progress Notes (Signed)
OB/GYN Faculty Practice Delivery Note  Monica Munoz is a 30 y.o. G6 now P6005 s/p SVD at [redacted]w[redacted]d. She was admitted for elective IOL for pubic symphysis separation at term.   ROM: just prior to delivery at 1955 with clear fluid GBS Status: negative Maximum Maternal Temperature: 98.9 Fahrenheit  Labor Progress: . Labor progressed normal, pt received Foley bulb, then started on pitocin and progressed to complete.  Delivery Date/Time: 02/23/2020 @ 1959 Delivery: Called to room and patient was complete and pushing. Head delivered LOA. Nuchal cord present, delivered through. Shoulder and body delivered in usual fashion. Infant with spontaneous cry, placed on mother's abdomen, not stimulated and was gently dried without rubbing due to skin abnormalities noted, NICU present at bedside. Cord clamped x 2 after 1-minute delay, and cut by father under my direct supervision. Cord blood drawn. Placenta delivered spontaneously with gentle cord traction, inspected and noted to be intact. Fundus firm with massage and Pitocin. Due to patient's history of PPH 800 mcg cytotec given PR, patient tolerated well and fundus firm with massage and excellent hemostasis achieved. Labia, perineum, vagina, and cervix were inspected, first degree perineal tear on maternal right noted, re-approximated with one simple interrupted suture 4-0 Vicryl and excellent hemostasis achieved.   Placenta: complete, three vessel cord appreciated Complications: none Lacerations: first degree perineal, simple interrupted suture x1 with excellent hemostasis EBL: 300 mL Analgesia: epidural  Postpartum Planning [x]  message to sent to schedule follow-up  [ ]  vaccines UTD  Infant: female  APGARs 8, 9  weight pending  , MD Center for , [facility]

## 2020-02-23 NOTE — Anesthesia Preprocedure Evaluation (Addendum)
Anesthesia Evaluation  Patient identified by MRN, date of birth, ID band Patient awake    Reviewed: Allergy & Precautions, Patient's Chart, lab work & pertinent test results  Airway Mallampati: II  TM Distance: >3 FB Neck ROM: Full    Dental no notable dental hx.    Pulmonary asthma ,    Pulmonary exam normal breath sounds clear to auscultation       Cardiovascular negative cardio ROS Normal cardiovascular exam Rhythm:Regular Rate:Normal     Neuro/Psych  Headaches, PSYCHIATRIC DISORDERS Anxiety Depression Bipolar Disorder Borderline personality disorder    GI/Hepatic GERD  ,(+)     substance abuse  marijuana use,   Endo/Other  diabetes, Well Controlled, Gestational  Renal/GU negative Renal ROS  negative genitourinary   Musculoskeletal negative musculoskeletal ROS (+)   Abdominal   Peds  Hematology  (+) anemia ,   Anesthesia Other Findings   Reproductive/Obstetrics (+) Pregnancy                           Anesthesia Physical Anesthesia Plan  ASA: II  Anesthesia Plan: Epidural   Post-op Pain Management:    Induction:   PONV Risk Score and Plan:   Airway Management Planned: Natural Airway  Additional Equipment:   Intra-op Plan:   Post-operative Plan:   Informed Consent: I have reviewed the patients History and Physical, chart, labs and discussed the procedure including the risks, benefits and alternatives for the proposed anesthesia with the patient or authorized representative who has indicated his/her understanding and acceptance.       Plan Discussed with: Anesthesiologist  Anesthesia Plan Comments:         Anesthesia Quick Evaluation

## 2020-02-23 NOTE — Anesthesia Procedure Notes (Signed)
Epidural Patient location during procedure: OB Start time: 02/23/2020 2:14 PM End time: 02/23/2020 2:23 PM  Staffing Anesthesiologist: Mal Amabile, MD Performed: anesthesiologist   Preanesthetic Checklist Completed: patient identified, IV checked, site marked, risks and benefits discussed, surgical consent, monitors and equipment checked, pre-op evaluation and timeout performed  Epidural Patient position: sitting Prep: DuraPrep and site prepped and draped Patient monitoring: continuous pulse ox and blood pressure Approach: midline Location: L3-L4 Injection technique: LOR air  Needle:  Needle type: Tuohy  Needle gauge: 17 G Needle length: 9 cm and 9 Needle insertion depth: 5 cm cm Catheter type: closed end flexible Catheter size: 19 Gauge Catheter at skin depth: 10 cm Test dose: negative and Other  Assessment Events: blood not aspirated, injection not painful, no injection resistance, no paresthesia and negative IV test  Additional Notes Patient identified. Risks and benefits discussed including failed block, incomplete  Pain control, post dural puncture headache, nerve damage, paralysis, blood pressure Changes, nausea, vomiting, reactions to medications-both toxic and allergic and post Partum back pain. All questions were answered. Patient expressed understanding and wished to proceed. Sterile technique was used throughout procedure. Epidural site was Dressed with sterile barrier dressing. No paresthesias, signs of intravascular injection Or signs of intrathecal spread were encountered.  Patient was more comfortable after the epidural was dosed. Please see RN's note for documentation of vital signs and FHR which are stable. Reason for block:procedure for pain

## 2020-02-23 NOTE — Progress Notes (Signed)
Cook Catheter placement by Dr. Crissie Reese. 40ml injected into uterine balloon.

## 2020-02-23 NOTE — Discharge Summary (Signed)
Postpartum Discharge Summary      Patient Name: Monica Munoz DOB: [birth year] MRN: [id number]  Date of admission: 02/23/2020 Delivery date:02/23/2020  Delivering provider: Christin Fudge  Date of discharge: 02/25/2020  Admitting diagnosis: Encounter for elective induction of labor [Z34.90] Intrauterine pregnancy: [redacted]w[redacted]d    Secondary diagnosis:  Active Problems:   History of postpartum hemorrhage, currently pregnant   Family history of genetic disorder   Supervision of high risk pregnancy, antepartum   Pregnancy related symphysis pain, antepartum   Gestational diabetes   Encounter for elective induction of labor   History of bilateral salpingectomy  Additional problems:  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 yo [id number] admitted to the hospital 02/23/2020 for induction of labor.  Indication for induction: Elective.  Patient had an uncomplicated labor course as follows: Membrane Rupture Time/Date: 7:55 PM ,02/23/2020   Delivery Method:Vaginal, Spontaneous  Episiotomy: None  Lacerations:  1st degree  Details of delivery can be found in separate delivery note.  Patient had a routine postpartum course. Patient is discharged home 02/25/20.  Newborn Data: Birth date:02/23/2020  Birth time:7:59 PM  Gender:Female  Living status:Living  Apgars:8 ,9  Weight:3090 g   Magnesium Sulfate received: No BMZ received: No Rhophylac:N/A MMR:No T-DaP:declined Flu: declined Transfusion:No  Physical exam  Vitals:   02/24/20 2048 02/24/20 2309 02/24/20 2311 02/25/20 0327  BP: 129/81 123/82 123/82 126/87  Pulse: 65 62 66 68  Resp: '18 18  20  ' Temp: 98.3 F (36.8 C) 98 F (36.7 C)  97.6 F (36.4 C)  TempSrc:  Oral Oral  Oral  SpO2: 98% 98%  97%   General: alert, cooperative and no distress Lochia: appropriate Uterine Fundus: firm Incision: Dressing is clean, dry, and intact at umbilicus DVT Evaluation: No evidence of DVT seen on physical exam. Labs: Lab Results  Component Value Date   WBC 13.5 (H) 02/23/2020   HGB 10.0 (L) 02/23/2020   HCT 30.5 (L) 02/23/2020   MCV 90.5 02/23/2020   PLT 300 02/23/2020   CMP Latest Ref Rng & Units 10/22/2019  Glucose 70 - 99 mg/dL 78  BUN 6 - 20 mg/dL 7  Creatinine 0.44 - 1.00 mg/dL 0.56  Sodium 135 - 145 mmol/L 133(L)  Potassium 3.5 - 5.1 mmol/L 3.7  Chloride 98 - 111 mmol/L 104  CO2 22 - 32 mmol/L 21(L)  Calcium 8.9 - 10.3 mg/dL 8.3(L)  Total Protein 6.5 - 8.1 g/dL -  Total Bilirubin 0.3 - 1.2 mg/dL -  Alkaline Phos 38 - 126 U/L -  AST 15 - 41 U/L -  ALT 14 - 54 U/L -   Edinburgh Score: Edinburgh Postnatal Depression Scale Screening Tool 10/05/2016  I have been able to laugh and see the funny side of things. 3  I have looked forward with enjoyment to things. 0  I have blamed myself unnecessarily when things went wrong. 1  I have been anxious or worried for no good reason. 0  I have felt scared or panicky for no good reason. 0  Things have been getting on top of me. 0  I have been so unhappy that I have had difficulty sleeping. 0  I have felt sad or miserable. 0  I have been so unhappy that I have been crying. 0  The thought of harming myself has occurred to me. 0  Edinburgh Postnatal Depression Scale Total 4     After visit meds:  Allergies as of 02/25/2020      Reactions   Benadryl [diphenhydramine Hcl] Other (See Comments)   "whole body red"   Reglan [metoclopramide] Shortness Of Breath   Chest pain, face numb and red   Lorazepam Other (See Comments)   Pt states that this med makes her feel "loopy".    Vancomycin Itching   Adhesive [tape] Itching, Dermatitis      Medication List    STOP taking these medications   Accu-Chek  Softclix Lancets lancets   glucose blood test strip     TAKE these medications   albuterol 108 (90 Base) MCG/ACT inhaler Commonly known as: VENTOLIN HFA Inhale 2 puffs into the lungs 4 (four) times daily as needed for wheezing or shortness of breath.   calcium carbonate 500 MG chewable tablet Commonly known as: TUMS - dosed in mg elemental calcium Chew 1-2 tablets by mouth 4 (four) times daily as needed for indigestion or heartburn.   ibuprofen 600 MG tablet Commonly known as: ADVIL Take 1 tablet (600 mg total) by mouth every 6 (six) hours.   oxyCODONE-acetaminophen 5-325 MG tablet Commonly known as: PERCOCET/ROXICET Take 1 tablet by mouth every 6 (six) hours as needed for moderate pain.   prenatal multivitamin Tabs tablet Take 1 tablet by mouth at bedtime.        Discharge home in stable condition Infant Feeding: Bottle and Breast Infant Disposition:NICU Discharge instruction: per After Visit Summary and Postpartum booklet. Activity: Advance as tolerated. Pelvic rest for 6 weeks.  Diet: routine diet Future Appointments: Future Appointments  Date Time Provider [place]  04/05/2020  8:30 AM [facility] None  04/05/2020  8:45 AM Woodroe Mode, MD [facility] None   Follow up Visit:   Please schedule this patient for a Virtual postpartum visit in 4 weeks with the following provider: Any provider. Additional Postpartum F/U: 2 hr GTT High risk pregnancy complicated by: GDM Delivery mode:  Vaginal, Spontaneous  Anticipated Birth Control:  BTL done [facility]    02/25/2020 [name], MD

## 2020-02-24 ENCOUNTER — Inpatient Hospital Stay (HOSPITAL_COMMUNITY): Payer: Medicaid Other

## 2020-02-24 ENCOUNTER — Encounter (HOSPITAL_COMMUNITY)
Admission: AD | Disposition: A | Discharge status: Home / Self Care | Payer: Self-pay | Source: Home / Self Care | Attending: Family Medicine

## 2020-02-24 DIAGNOSIS — Z9079 Acquired absence of other genital organ(s): Secondary | ICD-10-CM

## 2020-02-24 DIAGNOSIS — Z302 Encounter for sterilization: Secondary | ICD-10-CM

## 2020-02-24 HISTORY — PX: TUBAL LIGATION: SHX77

## 2020-02-24 LAB — GLUCOSE, CAPILLARY
Glucose-Capillary: 100 mg/dL — ABNORMAL HIGH (ref 70–99)
Glucose-Capillary: 62 mg/dL — ABNORMAL LOW (ref 70–99)

## 2020-02-24 LAB — RPR: RPR Ser Ql: NONREACTIVE

## 2020-02-24 SURGERY — LIGATION, FALLOPIAN TUBE, POSTPARTUM
Anesthesia: Spinal | Laterality: Bilateral

## 2020-02-24 MED ORDER — SUCCINYLCHOLINE CHLORIDE 200 MG/10ML IV SOSY
PREFILLED_SYRINGE | INTRAVENOUS | Status: DC | PRN
  Administered 2020-02-24: 100 mg via INTRAVENOUS

## 2020-02-24 MED ORDER — HYDROMORPHONE HCL 1 MG/ML IJ SOLN
1.0000 mg | Freq: Once | INTRAMUSCULAR | Status: AC
  Administered 2020-02-24: 1 mg via INTRAVENOUS
  Filled 2020-02-24: qty 1

## 2020-02-24 MED ORDER — BUPIVACAINE HCL (PF) 0.25 % IJ SOLN
INTRAMUSCULAR | Status: DC | PRN
  Administered 2020-02-24: 30 mL

## 2020-02-24 MED ORDER — ONDANSETRON HCL 4 MG/2ML IJ SOLN
INTRAMUSCULAR | Status: DC | PRN
  Administered 2020-02-24: 4 mg via INTRAVENOUS

## 2020-02-24 MED ORDER — ACETAMINOPHEN 500 MG PO TABS
1000.0000 mg | ORAL_TABLET | Freq: Once | ORAL | Status: AC
  Administered 2020-02-24: 1000 mg via ORAL

## 2020-02-24 MED ORDER — ACETAMINOPHEN 160 MG/5ML PO SOLN: 1000.0000 mg | Freq: Once | ORAL | Status: AC

## 2020-02-24 MED ORDER — PROPOFOL 10 MG/ML IV BOLUS
INTRAVENOUS | Status: DC | PRN
  Administered 2020-02-24: 150 mg via INTRAVENOUS

## 2020-02-24 MED ORDER — MIDAZOLAM HCL 2 MG/2ML IJ SOLN
INTRAMUSCULAR | Status: DC | PRN
  Administered 2020-02-24 (×2): 1 mg via INTRAVENOUS

## 2020-02-24 MED ORDER — PHENYLEPHRINE 40 MCG/ML (10ML) SYRINGE FOR IV PUSH (FOR BLOOD PRESSURE SUPPORT)
PREFILLED_SYRINGE | INTRAVENOUS | Status: DC | PRN
  Administered 2020-02-24: 80 ug via INTRAVENOUS

## 2020-02-24 MED ORDER — LIDOCAINE 2% (20 MG/ML) 5 ML SYRINGE
INTRAMUSCULAR | Status: AC
  Filled 2020-02-24: qty 5

## 2020-02-24 MED ORDER — LIDOCAINE 2% (20 MG/ML) 5 ML SYRINGE
INTRAMUSCULAR | Status: DC | PRN
  Administered 2020-02-24: 100 mg via INTRAVENOUS

## 2020-02-24 MED ORDER — FENTANYL CITRATE (PF) 100 MCG/2ML IJ SOLN
INTRAMUSCULAR | Status: DC | PRN
  Administered 2020-02-24: 50 ug via INTRAVENOUS
  Administered 2020-02-24: 25 ug via INTRAVENOUS

## 2020-02-24 MED ORDER — FENTANYL CITRATE (PF) 100 MCG/2ML IJ SOLN
INTRAMUSCULAR | Status: DC | PRN
  Administered 2020-02-24: 15 ug via INTRATHECAL

## 2020-02-24 MED ORDER — BUPIVACAINE IN DEXTROSE 0.75-8.25 % IT SOLN
INTRATHECAL | Status: DC | PRN
  Administered 2020-02-24: 1.4 mL via INTRATHECAL

## 2020-02-24 MED ORDER — ONDANSETRON HCL 4 MG/2ML IJ SOLN
INTRAMUSCULAR | Status: AC
  Filled 2020-02-24: qty 2

## 2020-02-24 MED ORDER — BUPIVACAINE HCL (PF) 0.25 % IJ SOLN
INTRAMUSCULAR | Status: AC
  Filled 2020-02-24: qty 30

## 2020-02-24 MED ORDER — FENTANYL CITRATE (PF) 100 MCG/2ML IJ SOLN
INTRAMUSCULAR | Status: AC
  Filled 2020-02-24: qty 2

## 2020-02-24 MED ORDER — PROMETHAZINE HCL 25 MG/ML IJ SOLN: 6.2500 mg | INTRAMUSCULAR | Status: DC | PRN

## 2020-02-24 MED ORDER — FENTANYL CITRATE (PF) 100 MCG/2ML IJ SOLN: 25.0000 ug | INTRAMUSCULAR | Status: DC | PRN

## 2020-02-24 MED ORDER — ZOLPIDEM TARTRATE 5 MG PO TABS
5.0000 mg | ORAL_TABLET | Freq: Every evening | ORAL | Status: DC | PRN
  Administered 2020-02-24: 5 mg via ORAL
  Filled 2020-02-24: qty 1

## 2020-02-24 MED ORDER — SOD CITRATE-CITRIC ACID 500-334 MG/5ML PO SOLN
ORAL | Status: AC
  Filled 2020-02-24: qty 15

## 2020-02-24 MED ORDER — LACTATED RINGERS IV SOLN: INTRAVENOUS | Status: DC

## 2020-02-24 MED ORDER — KETOROLAC TROMETHAMINE 30 MG/ML IJ SOLN
INTRAMUSCULAR | Status: AC
  Filled 2020-02-24: qty 1

## 2020-02-24 MED ORDER — PHENYLEPHRINE 40 MCG/ML (10ML) SYRINGE FOR IV PUSH (FOR BLOOD PRESSURE SUPPORT)
PREFILLED_SYRINGE | INTRAVENOUS | Status: AC
  Filled 2020-02-24: qty 10

## 2020-02-24 MED ORDER — ACETAMINOPHEN 500 MG PO TABS
ORAL_TABLET | ORAL | Status: AC
  Filled 2020-02-24: qty 2

## 2020-02-24 MED ORDER — MIDAZOLAM HCL 2 MG/2ML IJ SOLN
INTRAMUSCULAR | Status: AC
  Filled 2020-02-24: qty 2

## 2020-02-24 MED ORDER — SUCCINYLCHOLINE CHLORIDE 200 MG/10ML IV SOSY
PREFILLED_SYRINGE | INTRAVENOUS | Status: AC
  Filled 2020-02-24: qty 10

## 2020-02-24 MED ORDER — POVIDONE-IODINE 10 % EX SWAB: 2.0000 "application " | Freq: Once | CUTANEOUS | Status: DC

## 2020-02-24 MED ORDER — PROPOFOL 10 MG/ML IV BOLUS
INTRAVENOUS | Status: AC
  Filled 2020-02-24: qty 20

## 2020-02-24 MED ORDER — KETOROLAC TROMETHAMINE 30 MG/ML IJ SOLN
30.0000 mg | Freq: Once | INTRAMUSCULAR | Status: AC
  Administered 2020-02-24: 30 mg via INTRAVENOUS

## 2020-02-24 MED ORDER — OXYCODONE-ACETAMINOPHEN 5-325 MG PO TABS
1.0000 | ORAL_TABLET | Freq: Four times a day (QID) | ORAL | Status: DC | PRN
  Administered 2020-02-24 – 2020-02-25 (×5): 2 via ORAL
  Filled 2020-02-24 (×6): qty 2

## 2020-02-24 SURGICAL SUPPLY — 22 items
BLADE SURG 11 STRL SS (BLADE) ×2 IMPLANT
DRSG OPSITE POSTOP 3X4 (GAUZE/BANDAGES/DRESSINGS) ×2 IMPLANT
DURAPREP 26ML APPLICATOR (WOUND CARE) ×2 IMPLANT
GAUZE SPONGE 4X4 16PLY XRAY LF (GAUZE/BANDAGES/DRESSINGS) ×2 IMPLANT
GLOVE BIOGEL PI IND STRL 7.0 (GLOVE) ×1 IMPLANT
GLOVE BIOGEL PI IND STRL 7.5 (GLOVE) ×1 IMPLANT
GLOVE BIOGEL PI INDICATOR 7.0 (GLOVE) ×1
GLOVE BIOGEL PI INDICATOR 7.5 (GLOVE) ×1
GLOVE ECLIPSE 7.5 STRL STRAW (GLOVE) ×2 IMPLANT
GOWN STRL REUS W/TWL LRG LVL3 (GOWN DISPOSABLE) ×4 IMPLANT
HIBICLENS CHG 4% 4OZ BTL (MISCELLANEOUS) ×2 IMPLANT
NEEDLE HYPO 22GX1.5 SAFETY (NEEDLE) ×2 IMPLANT
NS IRRIG 1000ML POUR BTL (IV SOLUTION) ×2 IMPLANT
PACK ABDOMINAL MINOR (CUSTOM PROCEDURE TRAY) ×2 IMPLANT
PROTECTOR NERVE ULNAR (MISCELLANEOUS) ×2 IMPLANT
SPONGE LAP 4X18 RFD (DISPOSABLE) IMPLANT
SUT VIC AB 4-0 PS2 27 (SUTURE) ×2 IMPLANT
SUT VICRYL 0 UR6 27IN ABS (SUTURE) ×4 IMPLANT
SUT VICRYL 4-0 PS2 18IN ABS (SUTURE) ×2 IMPLANT
SYR CONTROL 10ML LL (SYRINGE) ×2 IMPLANT
TOWEL OR 17X24 6PK STRL BLUE (TOWEL DISPOSABLE) ×4 IMPLANT
TRAY FOLEY W/BAG SLVR 14FR (SET/KITS/TRAYS/PACK) ×2 IMPLANT

## 2020-02-24 NOTE — Anesthesia Procedure Notes (Signed)
Procedure Name: Intubation Date/Time: 02/24/2020 2:41 PM Performed by: Lenox Ahr, CRNA Pre-anesthesia Checklist: Patient identified, Patient being monitored, Timeout performed, Emergency Drugs available and Suction available Patient Re-evaluated:Patient Re-evaluated prior to induction Oxygen Delivery Method: Circle System Utilized Preoxygenation: Pre-oxygenation with 100% oxygen Induction Type: IV induction Ventilation: Mask ventilation without difficulty Laryngoscope Size: 3 and Glidescope Grade View: Grade I Tube type: Oral Tube size: 7.0 mm Number of attempts: 1 Airway Equipment and Method: stylet Placement Confirmation: ETT inserted through vocal cords under direct vision,  positive ETCO2 and breath sounds checked- equal and bilateral Secured at: 21 cm Tube secured with: Tape Dental Injury: Teeth and Oropharynx as per pre-operative assessment

## 2020-02-24 NOTE — Anesthesia Postprocedure Evaluation (Deleted)
Anesthesia Post Note  Patient: Monica Munoz  Procedure(s) Performed: POST PARTUM TUBAL LIGATION (Bilateral )     Anesthesia Type: Epidural Anesthetic complications: no   No complications documented.  Last Vitals:  Vitals:   02/24/20 0810 02/24/20 1119  BP: 126/66 120/75  Pulse: 67 (!) 52  Resp: 17 18  Temp: (!) 36.4 C 36.5 C  SpO2: 99% 99%    Last Pain:  Vitals:   02/24/20 1119  TempSrc: Oral  PainSc:    Pain Goal: Patients Stated Pain Goal: 4 (02/24/20 0920)                 Larance Ratledge A.

## 2020-02-24 NOTE — Clinical Social Work Maternal (Signed)
CLINICAL SOCIAL WORK MATERNAL/CHILD NOTE  Patient Details  Name: Monica Munoz MRN: [id number] Date of Birth: [birth year]  Date:  02/24/2020  Clinical Social Worker Initiating Note:  Laurey Arrow Date/Time: Initiated:  02/24/20/1339     Child's Name:  Monica Munoz   Biological Parents:  Mother,Father   Need for Interpreter:  None   Reason for Referral:  Behavioral Health Concerns,Current Substance Use/Substance Use During Pregnancy    Address:  [place] [address]    Phone number:  [phone] (home)     Additional phone number: [name] and FOB shared the same telephone number Household Members/Support Persons (HM/SP):   Household Member/Support Person 1,Household Member/Support Person 2,Household Member/Support Person 3,Household Member/Support Person 4,Household Member/Support Person 5   HM/SP Name Relationship DOB or Age  HM/SP -1 Wynn Banker FOB 11/06/1992  HM/SP -2 Gwenyth Allegra son age 41  HM/SP -28 [place] son age 85  HM/SP -80 [place] daughter age 71  HM/SP -59 [place] daughter age 4  HM/SP -65        HM/SP -7        HM/SP -8          Natural Supports (not living in the home):  Immediate Soil scientist Supports: None   Employment: Unemployed   Type of Work:     Education:  Programmer, systems   Homebound arranged:    Museum/gallery curator Resources:  [employer]   Other Resources:  Physicist, medical ,[place] Considerations Which May Impact Care:  Per [employer], [name] is [employer].   Strengths:  Ability to meet basic needs ,Home prepared for child ,Understanding of illness   Psychotropic Medications:         Pediatrician:       Pediatrician List: Peds list was left at infant's bedside.   [facility]      Pediatrician Fax Number:    Risk Factors/Current Problems:  Substance Use  ,Mental Health Concerns    Cognitive State:  Able to Concentrate ,Alert ,Linear Thinking    Mood/Affect:  Irritable ,Apprehensive ,Anxious ,Agitated    CSW Assessment: CSW met with [name] and FOB in room 315 to complete an assessment for a consult for hx of THC use in pregnancy and MH hx. When CSW arrived, [name] was resting in bed and FOB was sitting on the couch. CSW explained CSW's role and [name] gave CSW verbal permission to complete clinical assessment while FOB was present (FOB did not engage with CSW).  [name] was appeared agitated and was not a good historian however she was receptive to meeting with CSW.   CSW asked about [name]'s MH hx.  [name] denied a dx of borderline personality disorder and stated, "That's what my mama said I had but I have never been dx with that; hell my mama is the one that is borderline." [name] openly acknowledged a dx of anxiety and depression and communicated that the loss of her daughter at 85 months attributed to her feeling consistently anxious and sad. [name] reported that she has never tried therapy and she took depression medication in the past but no meds in the past 4 years. CSW offered resources for outpatient counseling and [name] declined. CSW provided education regarding the baby blues period vs. perinatal mood disorders, discussed treatment and gave resources  for mental health follow up if concerns arise.  CSW recommends self-evaluation during the postpartum time period using the New Mom Checklist from Postpartum Progress and encouraged [name] to contact a medical professional if symptoms are noted at any time. [name] acknowledged having PPD symptoms with a few of her older children, but she was unable to identify which child she experienced with. CSW assessed for safety and [name] denied SI and HI.  [name] did not demonstrate any acute MH signs and expressed feeling comfortable seeking help if needed.   CSW inquired about [name]'s substance use, and [name] denied the use of all illicit  substances. [name] reported utilizing marijuana during pregnancy while residing in [place]. Per [name], [name] and family moved to [place] in [place]). [name] reported, "It was legal for me to smoke in [place], so I did."  CSW informed [name] of the hospital's drug screen policy. [name] was made aware of the 2 drug screenings for the infant.  [name] was understanding and did not have any concerns.  CSW shared with [name] that the infant's UDS was positive for THC and Opiates, and CSW will make a report to [place] (report made to CPS worker Wendall Stade). [name] become tearful and begin to use profanity and asked CSW to leave; CSW left without incident and provide bedside nurse an update. Prior to leaving , CSW made [name] aware the CSW will continue to monitor infant's CDS and will update [facility] CPS.   If CSW accepts, CSW will continue to offer resources and supports to family while infant remains in NICU.    CSW Plan/Description:  Psychosocial Support and Ongoing Assessment of Needs,Sudden Infant Death Syndrome (SIDS) Education,Perinatal Mood and Anxiety Disorder (PMADs) Education,Other Patient/Family Education,Hospital Drug Screen Policy Information,Other Information/Referral to [employer] Service Report ,CSW Awaiting CPS Disposition Plan,CSW Will Continue to Monitor Umbilical Cord Tissue Drug Screen Results and Make Report if Warranted   Laurey Arrow, MSW, LCSW Clinical Social Work [phone]  Dimple Nanas, LCSW 02/24/2020, 2:01 PM

## 2020-02-24 NOTE — Progress Notes (Signed)
Post Partum Day 1 Subjective: no complaints, up ad lib, voiding and tolerating PO  Objective: Blood pressure 126/66, pulse 67, temperature (!) 97.5 F (36.4 C), temperature source Axillary, resp. rate 17, last menstrual period 05/26/2019, SpO2 99 %, unknown if currently breastfeeding.  Physical Exam:  General: alert, cooperative and appears stated age Lochia: appropriate Uterine Fundus: firm DVT Evaluation: No evidence of DVT seen on physical exam.  Recent Labs    02/23/20 1210  HGB 10.0*  HCT 30.5*    Assessment/Plan: Plan for discharge tomorrow, Breastfeeding and Contraception for pp BTL    LOS: 1 day   Reva Bores 02/24/2020, 10:54 AM

## 2020-02-24 NOTE — Anesthesia Preprocedure Evaluation (Addendum)
Anesthesia Evaluation  Patient identified by MRN, date of birth, ID band Patient awake    Reviewed: Allergy & Precautions, NPO status , Patient's Chart, lab work & pertinent test results  History of Anesthesia Complications Negative for: history of anesthetic complications  Airway Mallampati: II  TM Distance: >3 FB Neck ROM: Full    Dental no notable dental hx.    Pulmonary asthma ,    Pulmonary exam normal        Cardiovascular negative cardio ROS Normal cardiovascular exam     Neuro/Psych  Headaches, Anxiety Depression Bipolar Disorder    GI/Hepatic negative GI ROS, Neg liver ROS,   Endo/Other  diabetes, Gestational  Renal/GU negative Renal ROS  negative genitourinary   Musculoskeletal negative musculoskeletal ROS (+)   Abdominal   Peds  Hematology negative hematology ROS (+)   Anesthesia Other Findings Day of surgery medications reviewed with patient.  Reproductive/Obstetrics PPD1                            Anesthesia Physical Anesthesia Plan  ASA: II  Anesthesia Plan: Spinal   Post-op Pain Management:    Induction:   PONV Risk Score and Plan: 2 and Treatment may vary due to age or medical condition, Ondansetron and Dexamethasone  Airway Management Planned: Natural Airway  Additional Equipment: None  Intra-op Plan:   Post-operative Plan:   Informed Consent: I have reviewed the patients History and Physical, chart, labs and discussed the procedure including the risks, benefits and alternatives for the proposed anesthesia with the patient or authorized representative who has indicated his/her understanding and acceptance.       Plan Discussed with: CRNA  Anesthesia Plan Comments:        Anesthesia Quick Evaluation

## 2020-02-24 NOTE — Op Note (Signed)
Bilateral Salpingectomy  PREOPERATIVE DIAGNOSIS:  Undesired fertility  POSTOPERATIVE DIAGNOSIS:  Undesired fertility  PROCEDURE:  Postpartum Bilateral Tubal Sterilization using bilateral salpingectomy  ANESTHESIA:  Spinal and general anesthesia, local marcaine 30cc  SURGEON: Dr Candelaria Celeste  ASSISTANT: Dr Mart Piggs  COMPLICATIONS:  None immediate.  ESTIMATED BLOOD LOSS:  Less than 20cc.  FLUIDS: 1000 cc LR.  URINE OUTPUT:  0 cc of urine, foley catheter inserted post operatively   INDICATIONS: 30 year old female undesired fertility,status post vaginal delivery, desires permanent sterilization. Risks and benefits of procedure discussed with patient including permanence of method, bleeding, infection, injury to surrounding organs and need for additional procedures. Risk failure of 0.5-1% with increased risk of ectopic gestation if pregnancy occurs was also discussed with patient.   FINDINGS:  Normal uterus, tubes, and ovaries.  TECHNIQUE: After informed consent was obtained, the patient was taken to the operating room where anesthesia was induced and found to be adequate. A small transverse, infraumbilical skin incision was made with the scalpel. This incision was carried down to the underlying layer of fascia. The fascia was grasped with allis clamps tented up and entered sharply with Mayo scissors. Underlying peritoneum was then identified tented up and entered sharply with hemostats. The patient's right fallopian tube was then identified, brought to the incision, and grasped with a Babcock clamp. The tube was then followed out to the fimbria. Kelly forceps were placed on the mesosalpinx underneath most of the tube.  This pedicle was double suture ligated with 2-0 Vicryl, and the tube including the fimbriated end was excised.  The left fallopian tube was then identified, doubly ligated, and was excised in a similar fashion allowing for bilateral tubal sterilization via bilateral  salpingectomy.  Good hemostasis was noted overall. The fascia was re-approximated with 0 Vicryl. The skin was closed in a subcuticular fashion with 3-0 Vicryl. Quarter percent Marcaine solution was then injected at the incision site. The patient tolerated the procedure well. Sponge, lap, and needle count were correct x2. The patient was taken to recovery room in stable condition.  Alric Seton, MD OB Fellow, [facility], [facility] 02/24/2020 3:17 PM

## 2020-02-24 NOTE — Interval H&P Note (Signed)
History and Physical Interval Note:  02/24/2020 10:55 AM  Monica Munoz  has presented today for surgery, now PPD #1 s/p SVD with the diagnosis of PP BTL.  The various methods of treatment have been discussed with the patient and family. After consideration of risks, benefits and other options for treatment, the patient has consented to  Procedure(s): POST PARTUM TUBAL LIGATION (Bilateral) as a surgical intervention.  The patient's history has been reviewed, patient examined, no change in status, stable for surgery.  I have reviewed the patient's chart and labs. Patient counseled, r.e. Risks benefits of BTL, including permanency of procedure. Patient verbalized understanding and desires to proceed  Questions were answered to the patient's satisfaction.     Reva Bores

## 2020-02-24 NOTE — Anesthesia Postprocedure Evaluation (Signed)
Anesthesia Post Note  Patient: [name]  Procedure(s) Performed: POST PARTUM TUBAL LIGATION (Bilateral )     Patient location during evaluation: PACU Anesthesia Type: General and Spinal Level of consciousness: awake and alert and oriented Pain management: pain level controlled Vital Signs Assessment: post-procedure vital signs reviewed and stable Respiratory status: spontaneous breathing, nonlabored ventilation and respiratory function stable Cardiovascular status: blood pressure returned to baseline Postop Assessment: no apparent nausea or vomiting Anesthetic complications: no   No complications documented.  Last Vitals:  Vitals:   02/24/20 1630 02/24/20 1645  BP: (!) 133/96   Pulse: 73   Resp: 19 20  Temp:    SpO2: 99%     Last Pain:  Vitals:   02/24/20 1635  TempSrc:   PainSc: 9    Pain Goal: Patients Stated Pain Goal: 4 (02/24/20 0920)  LLE Motor Response: Purposeful movement (02/24/20 1630)   RLE Motor Response: Purposeful movement (02/24/20 1630)       Epidural/Spinal Function Cutaneous sensation: Tingles (02/24/20 1630), Patient able to flex knees: Yes (02/24/20 1630), Patient able to lift hips off bed: No (02/24/20 1630), Back pain beyond tenderness at insertion site: No (02/24/20 1630), Progressively worsening motor and/or sensory loss: No (02/24/20 1630), Bowel and/or bladder incontinence post epidural: No (02/24/20 1630)  [name]

## 2020-02-24 NOTE — Anesthesia Postprocedure Evaluation (Signed)
Anesthesia Post Note  Patient: Monica Munoz  Procedure(s) Performed: AN AD HOC LABOR EPIDURAL     Patient location during evaluation: OB High Risk Anesthesia Type: Epidural Level of consciousness: awake and alert Respiratory status: spontaneous breathing and nonlabored ventilation Cardiovascular status: blood pressure returned to baseline and stable Postop Assessment: no apparent nausea or vomiting Anesthetic complications: no   No complications documented.  Last Vitals:  Vitals:   02/24/20 0810 02/24/20 1119  BP: 126/66 120/75  Pulse: 67 (!) 52  Resp: 17 18  Temp: (!) 36.4 C 36.5 C  SpO2: 99% 99%    Last Pain:  Vitals:   02/24/20 1119  TempSrc: Oral  PainSc:    Pain Goal: Patients Stated Pain Goal: 4 (02/24/20 0920)                 Charletha Dalpe A.

## 2020-02-24 NOTE — Transfer of Care (Signed)
Immediate Anesthesia Transfer of Care Note  Patient: Monica Munoz  Procedure(s) Performed: POST PARTUM TUBAL LIGATION (Bilateral )  Patient Location: PACU  Anesthesia Type:General  Level of Consciousness: awake, alert  and oriented  Airway & Oxygen Therapy: Patient Spontanous Breathing and Patient connected to face mask oxygen  Post-op Assessment: Report given to RN and Post -op Vital signs reviewed and stable  Post vital signs: Reviewed and stable  Last Vitals:  Vitals Value Taken Time  BP 115/76 02/24/20 1530  Temp 36.7 C 02/24/20 1525  Pulse 73 02/24/20 1531  Resp 27 02/24/20 1531  SpO2 100 % 02/24/20 1531  Vitals shown include unvalidated device data.  Last Pain:  Vitals:   02/24/20 1525  TempSrc:   PainSc: 0-No pain      Patients Stated Pain Goal: 4 (02/24/20 0920)  Complications: No complications documented.

## 2020-02-24 NOTE — Anesthesia Procedure Notes (Signed)
Spinal  Patient location during procedure: OR Start time: 02/24/2020 2:22 PM End time: 02/24/2020 2:25 PM Staffing Performed: anesthesiologist  Anesthesiologist: [name], MD Preanesthetic Checklist Completed: patient identified, IV checked, risks and benefits discussed, monitors and equipment checked, pre-op evaluation and timeout performed Spinal Block Patient position: sitting Prep: DuraPrep and site prepped and draped Patient monitoring: heart rate, continuous pulse ox and blood pressure Approach: midline Location: L3-4 Injection technique: single-shot Needle Needle type: Pencan  Needle gauge: 24 G Needle length: 10 cm Assessment Sensory level: T6 Additional Notes Risks, benefits, and alternative discussed. Patient gave consent to procedure. Prepped and draped in sitting position. Clear CSF obtained after one needle redirection. Positive terminal aspiration. No pain or paraesthesias with injection. Patient tolerated procedure well. Vital signs stable. Amalia Greenhouse, MD

## 2020-02-24 NOTE — Lactation Note (Addendum)
This note was copied from a baby's chart. Lactation Consultation Note Baby is 6 hrs old in NICU. Mom's 6th child. Mom exclusively pumped for 3 of her other children for 2 months. Mom stated after 2 months her milk stopped. Mom stated she would pump 5 oz. And then the next day barely get 2 oz. And then nothing. Discussed w/mom consistently pumping every 3 hrs. discussed supply and demand. Mom shown how to use DEBP & how to disassemble, clean, & reassemble parts. Mom knows to pump q3h for 15-20 min. Discussed how to make hands free bra. Mom had lots of questions. LC answered them. Taught mom how to hand express after pumping. Mom pumped 5 ml and hand expressed 1 ml. Mom excited.  Mom has WIC, LC will send in [facility] referral. Mom doesn't have a pump. Baby will be in NICU for a couple of weeks mom thinks d/t EB disease. Lactation brochure given. LC fax WIC referral. LC took 6 ML colostrum to NICU.   Patient Name: Boy Shawneen Deetz [id number] Date: 02/24/2020 Reason for consult: Initial assessment;NICU baby;Term Age:65 hours  Maternal Data Has patient been taught Hand Expression?: Yes Does the patient have breastfeeding experience prior to this delivery?: Yes How long did the patient breastfeed?: pumped 2 months  Feeding    LATCH Score       Type of Nipple: Everted at rest and after stimulation  Comfort (Breast/Nipple): Soft / non-tender         Lactation Tools Discussed/Used Tools: Pump Breast pump type: Double-Electric Breast Pump Pump Education: Setup, frequency, and cleaning;Milk Storage Reason for Pumping: NICU Pumping frequency: q3 Pumped volume: 5 mL  Interventions Interventions: Expressed milk;Breast massage;Hand express;DEBP;Breast compression  Discharge Pump: DEBP WIC Program: Yes  Consult Status Consult Status: Follow-up Date: 02/25/20 Follow-up type: In-patient    Charyl Dancer 02/24/2020, 2:55 AM

## 2020-02-25 ENCOUNTER — Encounter (HOSPITAL_COMMUNITY): Payer: Self-pay | Admitting: Family Medicine

## 2020-02-25 MED ORDER — IBUPROFEN 600 MG PO TABS: 600.0000 mg | ORAL_TABLET | Freq: Four times a day (QID) | ORAL | 3 refills | Status: DC

## 2020-02-25 MED ORDER — OXYCODONE-ACETAMINOPHEN 5-325 MG PO TABS: 1.0000 | ORAL_TABLET | Freq: Four times a day (QID) | ORAL | 0 refills | Status: DC | PRN

## 2020-02-25 NOTE — Progress Notes (Signed)
CSW escorted CPS worker Sherrie George. to infant's bedside.  CPS received a medical update from infant's bedside RN. CSW also escorted CPS to [name]'s bedside. When CSW arrived, [name] and FOB were asleep. They were easily awaken and was receptive to meeting with CSW. CPS agreed to follow-up with [name] after a safety disposition plan is established.   There are barriers to infant's discharge.   CSW will continue to offer resources and supports to family while infant remains in NICU.     Blaine Hamper, MSW, LCSW Clinical Social Work [phone]

## 2020-02-25 NOTE — Lactation Note (Signed)
This note was copied from a baby's chart. Lactation Consultation Note  Patient Name: Monica Munoz [id number] Date: 02/25/2020 Reason for consult: Follow-up assessment;NICU baby Age:28 hours   Mother has not pumped since yesterday morning with LC.  Encouraged pumping q 3 hours. Offered to assist mother with pumping today and mother declined assistance.   Mother has spoken with [facility] about loaner pump which she will pick up today when discharged. Discussed milk storage, benefits of breastmilk ,hand expressing before and after pumping and engorgement care. Mother states she has pumped with 2 of her other children which were in the NICU also.  Lactation Tools Discussed/Used Pumping frequency:  (Recommend q 3 hours)  Interventions  Education  Discharge Discharge Education: Engorgement and breast care;Other (comment) WIC Program: Yes   Hardie Pulley  RN IBCLC 02/25/2020, 8:40 AM

## 2020-02-27 LAB — SURGICAL PATHOLOGY

## 2020-03-02 ENCOUNTER — Ambulatory Visit: Payer: Self-pay

## 2020-03-02 NOTE — Lactation Note (Signed)
This note was copied from a baby's chart. Lactation Consultation Note  Patient Name: [name] [id number] Date: 03/02/2020 Reason for consult: Follow-up assessment;NICU baby;Term Age:28 days  Lactation conducted a follow up with Ms. Daphine Deutscher. She requests lactation to return tomorrow at noon to assist with latching. We discussed pumping volume and frequency.  I recommended that she pump 8+ times a day and educated on emptying the breasts frequently and milk production.  She is interested in breast feeding. SLP will be assessing today at the 3 pm feeding, and I will follow up with SLP.   Ms. Doffing has her [facility] pump. She primarily rooms in with some home visits to see her other children.   Baby is named "Monica Munoz."   I followed up with SLP on Vocera after this visit.   Maternal Data Does the patient have breastfeeding experience prior to this delivery?: Yes  Feeding Mother's Current Feeding Choice: Breast Milk Nipple Type: Dr. Levert Feinstein Preemie  Lactation Tools Discussed/Used Pumping frequency: 3-4 hours Pumped volume: 100 mL  Interventions    Discharge Pump: WIC Loaner;DEBP WIC Program: Yes  Consult Status Consult Status: Follow-up Date: 03/03/20 Follow-up type: In-patient    Walker Shadow 03/02/2020, 10:39 AM

## 2020-03-03 ENCOUNTER — Ambulatory Visit: Payer: Self-pay

## 2020-03-03 NOTE — Lactation Note (Addendum)
This note was copied from a baby's chart. Lactation Consultation Note  Patient Name: Boy Luree Palla SKAJG'O Date: 03/03/2020 Reason for consult: Follow-up assessment;Mother's request;NICU baby;Term Age:28 days  1158 - 1220 - Lactation reported to Ms. Iannello's room to assist with breast feeding (scheduled appointment for 1200). Baby was crying in bassinet upon entry. RN conducted assessment, and baby's respiratory rate was elevated. We held off on PO/breast feeding at this touch time. The RN will notify lactation if baby is ready to breast feed at the next scheduled feeding. If not, I recommended that the lactation consultant check back tomorrow.  Baby became calm and sleepy after moving to mother's arms.  Ms. Muldoon states that she is pumping frequently. She was in need of larger storage bottles, which I provided. She states that she is "determined" to breast feed. I provided encouragement that we would work with her and Randie Heinz when he is ready.  Maternal Data Does the patient have breastfeeding experience prior to this delivery?: Yes  Feeding Mother's Current Feeding Choice: Breast Milk  Interventions Interventions:  (storage bottles)  Discharge    Consult Status Consult Status: Follow-up Date: 03/04/20 Follow-up type: In-patient    Walker Shadow 03/03/2020, 1:47 PM

## 2020-03-04 ENCOUNTER — Ambulatory Visit: Payer: Self-pay

## 2020-03-04 NOTE — Lactation Note (Signed)
This note was copied from a baby's chart. 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  LC in to assist with breastfeeding.  RN states that baby had just bottle fed.  Mom holding baby asleep in her lap.   Mom states she would like to try again tomorrow.  Baby on the 9-12-3 schedule.  Appt made for 03/05/20  Encouraged Mom to continue her consistent pumping.  Mom states that her milk let down is fast, so talked about pre-pumping for 5-10 mins the next time she attempts breastfeeding.   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   Baby has been a poor oral feeder taking 12% po yesterday.   Lactation Tools Discussed/Used Tools: Pump;Bottle Breast pump type: Double-Electric Breast Pump  Interventions Interventions: Breast feeding basics reviewed;Education;Skin to skin;Breast massage;Hand express;DEBP  Consult Status Consult Status: Follow-up Date: 03/05/20 Follow-up type: In-patient    Monica Munoz 03/04/2020, 12:05 PM

## 2020-03-05 ENCOUNTER — Ambulatory Visit: Payer: Self-pay

## 2020-03-05 NOTE — Lactation Note (Signed)
This note was copied from a baby's chart. Lactation Consultation Note  Patient Name: Monica Munoz [id number]'[id number] Date: 03/05/2020 Reason for consult: NICU baby;Follow-up assessment Age:28 days Mom bottle feeding infant while he remains in bed. Per RN, this method is best suited for infant at this time. Mother continues to pump with sufficient supply. No direct bf at this time, per RN. Mother with no questions/concerns. LC will plan f/u next week.  Feeding Mother's Current Feeding Choice: Breast Milk    Consult Status Consult Status: Follow-up Follow-up type: In-patient    Elder Negus, MA IBCLC 03/05/2020, 12:05 PM

## 2020-04-05 ENCOUNTER — Ambulatory Visit: Payer: Medicaid Other | Admitting: Obstetrics & Gynecology

## 2020-04-05 ENCOUNTER — Other Ambulatory Visit: Payer: Medicaid Other

## 2020-07-13 ENCOUNTER — Ambulatory Visit (HOSPITAL_COMMUNITY)
Admission: EM | Admit: 2020-07-13 | Discharge: 2020-07-13 | Disposition: A | Discharge status: Home / Self Care | Payer: Medicaid Other | Attending: Family Medicine | Admitting: Family Medicine

## 2020-07-13 ENCOUNTER — Other Ambulatory Visit: Payer: Self-pay

## 2020-07-13 ENCOUNTER — Encounter (HOSPITAL_COMMUNITY): Payer: Self-pay | Admitting: Emergency Medicine

## 2020-07-13 DIAGNOSIS — H9202 Otalgia, left ear: Secondary | ICD-10-CM
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
## 2020-07-13 NOTE — Discharge Instructions (Addendum)
If not allergic, you may use over the counter ibuprofen or acetaminophen as needed for pain/discomfort.
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
## 2021-01-21 ENCOUNTER — Encounter (HOSPITAL_COMMUNITY): Payer: Self-pay | Admitting: *Deleted

## 2021-01-21 ENCOUNTER — Emergency Department (HOSPITAL_COMMUNITY): Payer: Medicaid Other

## 2021-01-21 ENCOUNTER — Other Ambulatory Visit: Payer: Self-pay

## 2021-01-21 ENCOUNTER — Emergency Department (HOSPITAL_COMMUNITY)
Admission: EM | Admit: 2021-01-21 | Discharge: 2021-01-22 | Disposition: A | Discharge status: Home / Self Care | Payer: Medicaid Other | Attending: Emergency Medicine | Admitting: Emergency Medicine

## 2021-01-21 DIAGNOSIS — J189 Pneumonia, unspecified organism: Secondary | ICD-10-CM

## 2021-01-21 DIAGNOSIS — J111 Influenza due to unidentified influenza virus with other respiratory manifestations: Secondary | ICD-10-CM

## 2021-01-21 DIAGNOSIS — J1100 Influenza due to unidentified influenza virus with unspecified type of pneumonia: Secondary | ICD-10-CM | POA: Diagnosis not present

## 2021-01-21 DIAGNOSIS — R059 Cough, unspecified: Secondary | ICD-10-CM | POA: Diagnosis present

## 2021-01-21 DIAGNOSIS — J168 Pneumonia due to other specified infectious organisms: Secondary | ICD-10-CM | POA: Insufficient documentation

## 2021-01-21 DIAGNOSIS — D72829 Elevated white blood cell count, unspecified: Secondary | ICD-10-CM | POA: Diagnosis not present

## 2021-01-21 DIAGNOSIS — Z20822 Contact with and (suspected) exposure to covid-19: Secondary | ICD-10-CM | POA: Insufficient documentation

## 2021-01-21 LAB — URINALYSIS, ROUTINE W REFLEX MICROSCOPIC
Glucose, UA: NEGATIVE mg/dL
Ketones, ur: NEGATIVE mg/dL
Leukocytes,Ua: NEGATIVE
Nitrite: NEGATIVE
Protein, ur: 30 mg/dL — AB
Specific Gravity, Urine: 1.01 (ref 1.005–1.030)
pH: 6 (ref 5.0–8.0)

## 2021-01-21 LAB — URINALYSIS, MICROSCOPIC (REFLEX): Bacteria, UA: NONE SEEN

## 2021-01-21 MED ORDER — ONDANSETRON 4 MG PO TBDP: 4.0000 mg | ORAL_TABLET | Freq: Once | ORAL | Status: DC

## 2021-01-21 NOTE — ED Provider Triage Note (Signed)
Emergency Medicine Provider Triage Evaluation Note  Monica Munoz , a 31 y.o. female  was evaluated in triage.  Pt complains of generalized weakness.  Son recently hospitalized in ICU for metapneumovirus, whole family has been sick.  Vomiting x3 days, cough, lost voice today.  Now generally weak, having a hard time moving about.  Review of Systems  Positive: Vomiting, cough, lost voice Negative: fever  Physical Exam  BP (!) 133/96    Pulse 95    Temp 98.7 F (37.1 C)    Resp 20    Ht 5\' 5"  (1.651 m)    Wt 78.9 kg    LMP 01/10/2021    SpO2 95%    BMI 28.95 kg/m   Gen:   Awake, no distress   Resp:  Normal effort  MSK:   Moves extremities without difficulty  Other:  Voice is hoarse  Medical Decision Making  Medically screening exam initiated at 10:59 PM.  Appropriate orders placed.  Tory Lindenmuth was informed that the remainder of the evaluation will be completed by another provider, this initial triage assessment does not replace that evaluation, and the importance of remaining in the ED until their evaluation is complete.  Generalized weakness.  Whole family recently sick, son in ICU with metapneumovirus.  Afebrile, non-toxic.  VSS.  Labs, covid/flu and viral panels sent.   Larene Pickett, PA-C 01/21/21 2312

## 2021-01-21 NOTE — ED Triage Notes (Signed)
[  Pt c/o weakness the whole family has  been ill for several days with a cold  cough.    Lmp 26th

## 2021-01-22 LAB — RESPIRATORY PANEL BY PCR

## 2021-01-22 LAB — I-STAT BETA HCG BLOOD, ED (MC, WL, AP ONLY)
I-stat hCG, quantitative: <5 m[IU]/mL (ref <5)
I-stat hCG, quantitative: <5 m[IU]/mL (ref <5)

## 2021-01-22 LAB — CBC WITH DIFFERENTIAL/PLATELET
Abs Immature Granulocytes: 0 10*3/uL (ref 0.00–0.07)
Basophils Absolute: 0 10*3/uL (ref 0.0–0.1)
Basophils Relative: 0 %
Eosinophils Absolute: 0 10*3/uL (ref 0.0–0.5)
Eosinophils Relative: 0 %
HCT: 34.8 % — ABNORMAL LOW (ref 36.0–46.0)
Hemoglobin: 11.5 g/dL — ABNORMAL LOW (ref 12.0–15.0)
Lymphocytes Relative: 24 %
Lymphs Abs: 2.7 10*3/uL (ref 0.7–4.0)
MCH: 28 pg (ref 26.0–34.0)
MCHC: 33 g/dL (ref 30.0–36.0)
MCV: 84.7 fL (ref 80.0–100.0)
Monocytes Absolute: 0.5 10*3/uL (ref 0.1–1.0)
Monocytes Relative: 4 %
Neutro Abs: 8.2 10*3/uL — ABNORMAL HIGH (ref 1.7–7.7)
Neutrophils Relative %: 72 %
Platelets: 280 10*3/uL (ref 150–400)
RBC: 4.11 MIL/uL (ref 3.87–5.11)
RDW: 15.9 % — ABNORMAL HIGH (ref 11.5–15.5)
WBC: 11.4 10*3/uL — ABNORMAL HIGH (ref 4.0–10.5)
nRBC: 0 % (ref 0.0–0.2)
nRBC: 0 /100 WBC

## 2021-01-22 LAB — COMPREHENSIVE METABOLIC PANEL
ALT: 136 U/L — ABNORMAL HIGH (ref 0–44)
AST: 87 U/L — ABNORMAL HIGH (ref 15–41)
Albumin: 3.5 g/dL (ref 3.5–5.0)
Alkaline Phosphatase: 119 U/L (ref 38–126)
Anion gap: 11 (ref 5–15)
BUN: 8 mg/dL (ref 6–20)
CO2: 22 mmol/L (ref 22–32)
Calcium: 8.8 mg/dL — ABNORMAL LOW (ref 8.9–10.3)
Chloride: 103 mmol/L (ref 98–111)
Creatinine, Ser: 0.63 mg/dL (ref 0.44–1.00)
GFR, Estimated: >60 mL/min (ref >60)
Glucose, Bld: 96 mg/dL (ref 70–99)
Potassium: 3.2 mmol/L — ABNORMAL LOW (ref 3.5–5.1)
Sodium: 136 mmol/L (ref 135–145)
Total Bilirubin: 1.1 mg/dL (ref 0.3–1.2)
Total Protein: 7.4 g/dL (ref 6.5–8.1)

## 2021-01-22 LAB — RESP PANEL BY RT-PCR (FLU A&B, COVID) ARPGX2
Influenza A by PCR: POSITIVE — AB
Influenza B by PCR: NEGATIVE
SARS Coronavirus 2 by RT PCR: NEGATIVE

## 2021-01-22 LAB — TROPONIN I (HIGH SENSITIVITY): Troponin I (High Sensitivity): 9 ng/L (ref <18)

## 2021-01-22 LAB — LIPASE, BLOOD: Lipase: 52 U/L — ABNORMAL HIGH (ref 11–51)

## 2021-01-22 MED ORDER — POTASSIUM CHLORIDE CRYS ER 20 MEQ PO TBCR
40.0000 meq | EXTENDED_RELEASE_TABLET | Freq: Once | ORAL | Status: AC
  Administered 2021-01-22: 40 meq via ORAL
  Filled 2021-01-22: qty 2

## 2021-01-22 MED ORDER — ACETAMINOPHEN 500 MG PO TABS
1000.0000 mg | ORAL_TABLET | Freq: Once | ORAL | Status: AC
  Administered 2021-01-22: 1000 mg via ORAL
  Filled 2021-01-22: qty 2

## 2021-01-22 MED ORDER — SODIUM CHLORIDE 0.9 % IV BOLUS
1000.0000 mL | Freq: Once | INTRAVENOUS | Status: AC
  Administered 2021-01-22: 1000 mL via INTRAVENOUS

## 2021-01-22 MED ORDER — PROMETHAZINE-DM 6.25-15 MG/5ML PO SYRP: 5.0000 mL | ORAL_SOLUTION | Freq: Four times a day (QID) | ORAL | 0 refills | Status: DC | PRN

## 2021-01-22 MED ORDER — BENZONATATE 100 MG PO CAPS: 200.0000 mg | ORAL_CAPSULE | Freq: Three times a day (TID) | ORAL | 0 refills | Status: DC

## 2021-01-22 MED ORDER — ONDANSETRON HCL 4 MG/2ML IJ SOLN
4.0000 mg | Freq: Once | INTRAMUSCULAR | Status: AC
  Administered 2021-01-22: 4 mg via INTRAVENOUS
  Filled 2021-01-22: qty 2

## 2021-01-22 MED ORDER — BENZONATATE 100 MG PO CAPS
200.0000 mg | ORAL_CAPSULE | Freq: Once | ORAL | Status: AC
  Administered 2021-01-22: 200 mg via ORAL
  Filled 2021-01-22: qty 2

## 2021-01-22 MED ORDER — LEVOFLOXACIN 750 MG PO TABS: 750.0000 mg | ORAL_TABLET | Freq: Every day | ORAL | 0 refills | Status: AC

## 2021-01-22 MED ORDER — ONDANSETRON 4 MG PO TBDP: 4.0000 mg | ORAL_TABLET | Freq: Three times a day (TID) | ORAL | 0 refills | Status: DC | PRN

## 2021-01-22 NOTE — Discharge Instructions (Addendum)
You are diagnosed with influenza A This may cause symptoms for 7 to 10 days cough congestion fevers chills nausea vomiting diarrhea fatigue and body aches.  Please drink plenty of Gatorade, water, Pedialyte.  Use Zofran every 8 hours/3 times daily for the next 24 hours.  After that you may use it every 8 hours as needed for nausea.  If your nausea is severe you may use Zofran every 6 hours.  Please use Tylenol or ibuprofen for pain.  You may use 600 mg ibuprofen every 6 hours or 1000 mg of Tylenol every 6 hours.  You may choose to alternate between the 2.  This would be most effective.  Not to exceed 4 g of Tylenol within 24 hours.  Not to exceed 3200 mg ibuprofen 24 hours.   Please take the antibiotic as prescribed for the entire course.  I am treating you for a postviral pneumonia.  You also have influenza and an antibiotic I am prescribing you will not help at all with your influenza symptoms.  You will need to follow-up with your primary care doctor in the next 3 to 4 days to have your potassium and liver function checked.  These labs were only slightly abnormal today and all of your labs are consistent with a viral illness however I would like you to have a recheck so we can make sure that these labs improved.
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
## 2021-02-23 ENCOUNTER — Emergency Department (HOSPITAL_BASED_OUTPATIENT_CLINIC_OR_DEPARTMENT_OTHER)
Admission: EM | Admit: 2021-02-23 | Discharge: 2021-02-23 | Disposition: A | Discharge status: Home / Self Care | Payer: Medicaid Other | Attending: Emergency Medicine | Admitting: Emergency Medicine

## 2021-02-23 ENCOUNTER — Encounter (HOSPITAL_BASED_OUTPATIENT_CLINIC_OR_DEPARTMENT_OTHER): Payer: Self-pay | Admitting: Emergency Medicine

## 2021-02-23 ENCOUNTER — Other Ambulatory Visit: Payer: Self-pay

## 2021-02-23 ENCOUNTER — Emergency Department (HOSPITAL_BASED_OUTPATIENT_CLINIC_OR_DEPARTMENT_OTHER): Payer: Medicaid Other

## 2021-02-23 DIAGNOSIS — Z20822 Contact with and (suspected) exposure to covid-19: Secondary | ICD-10-CM | POA: Insufficient documentation

## 2021-02-23 DIAGNOSIS — K5900 Constipation, unspecified: Secondary | ICD-10-CM

## 2021-02-23 DIAGNOSIS — R110 Nausea: Secondary | ICD-10-CM

## 2021-02-23 DIAGNOSIS — R109 Unspecified abdominal pain: Secondary | ICD-10-CM | POA: Diagnosis not present

## 2021-02-23 DIAGNOSIS — D72829 Elevated white blood cell count, unspecified: Secondary | ICD-10-CM | POA: Diagnosis not present

## 2021-02-23 DIAGNOSIS — R112 Nausea with vomiting, unspecified: Secondary | ICD-10-CM | POA: Insufficient documentation

## 2021-02-23 LAB — URINALYSIS, ROUTINE W REFLEX MICROSCOPIC
Bilirubin Urine: NEGATIVE
Glucose, UA: NEGATIVE mg/dL
Ketones, ur: 15 mg/dL — AB
Leukocytes,Ua: NEGATIVE
Nitrite: NEGATIVE
RBC / HPF: >50 RBC/hpf — ABNORMAL HIGH (ref 0–5)
Specific Gravity, Urine: 1.015 (ref 1.005–1.030)
pH: 7.5 (ref 5.0–8.0)

## 2021-02-23 LAB — COMPREHENSIVE METABOLIC PANEL
ALT: 40 U/L (ref 0–44)
AST: 25 U/L (ref 15–41)
Albumin: 4.4 g/dL (ref 3.5–5.0)
Alkaline Phosphatase: 95 U/L (ref 38–126)
Anion gap: 11 (ref 5–15)
BUN: 13 mg/dL (ref 6–20)
CO2: 23 mmol/L (ref 22–32)
Calcium: 9.1 mg/dL (ref 8.9–10.3)
Chloride: 104 mmol/L (ref 98–111)
Creatinine, Ser: 0.77 mg/dL (ref 0.44–1.00)
GFR, Estimated: >60 mL/min (ref >60)
Glucose, Bld: 136 mg/dL — ABNORMAL HIGH (ref 70–99)
Potassium: 3.7 mmol/L (ref 3.5–5.1)
Sodium: 138 mmol/L (ref 135–145)
Total Bilirubin: 0.3 mg/dL (ref 0.3–1.2)
Total Protein: 7.3 g/dL (ref 6.5–8.1)

## 2021-02-23 LAB — CBC
HCT: 35.7 % — ABNORMAL LOW (ref 36.0–46.0)
Hemoglobin: 11.3 g/dL — ABNORMAL LOW (ref 12.0–15.0)
MCH: 27.4 pg (ref 26.0–34.0)
MCHC: 31.7 g/dL (ref 30.0–36.0)
MCV: 86.7 fL (ref 80.0–100.0)
Platelets: 419 10*3/uL — ABNORMAL HIGH (ref 150–400)
RBC: 4.12 MIL/uL (ref 3.87–5.11)
RDW: 16.7 % — ABNORMAL HIGH (ref 11.5–15.5)
WBC: 17.5 10*3/uL — ABNORMAL HIGH (ref 4.0–10.5)
nRBC: 0 % (ref 0.0–0.2)

## 2021-02-23 LAB — RESP PANEL BY RT-PCR (FLU A&B, COVID) ARPGX2
Influenza A by PCR: NEGATIVE
Influenza B by PCR: NEGATIVE
SARS Coronavirus 2 by RT PCR: NEGATIVE

## 2021-02-23 LAB — PREGNANCY, URINE: Preg Test, Ur: NEGATIVE

## 2021-02-23 LAB — LIPASE, BLOOD: Lipase: 38 U/L (ref 11–51)

## 2021-02-23 MED ORDER — IOHEXOL 300 MG/ML  SOLN
80.0000 mL | Freq: Once | INTRAMUSCULAR | Status: AC | PRN
  Administered 2021-02-23: 80 mL via INTRAVENOUS

## 2021-02-23 MED ORDER — PROCHLORPERAZINE EDISYLATE 10 MG/2ML IJ SOLN
10.0000 mg | Freq: Once | INTRAMUSCULAR | Status: AC
  Administered 2021-02-23: 10 mg via INTRAVENOUS
  Filled 2021-02-23: qty 2

## 2021-02-23 MED ORDER — ONDANSETRON HCL 4 MG PO TABS: 4.0000 mg | ORAL_TABLET | Freq: Three times a day (TID) | ORAL | 0 refills | Status: DC | PRN

## 2021-02-23 MED ORDER — FAMOTIDINE IN NACL 20-0.9 MG/50ML-% IV SOLN
20.0000 mg | Freq: Once | INTRAVENOUS | Status: AC
  Administered 2021-02-23: 20 mg via INTRAVENOUS
  Filled 2021-02-23: qty 50

## 2021-02-23 MED ORDER — KETOROLAC TROMETHAMINE 30 MG/ML IJ SOLN
30.0000 mg | Freq: Once | INTRAMUSCULAR | Status: AC
  Administered 2021-02-23: 30 mg via INTRAVENOUS
  Filled 2021-02-23: qty 1

## 2021-02-23 MED ORDER — SODIUM CHLORIDE 0.9 % IV BOLUS
1000.0000 mL | Freq: Once | INTRAVENOUS | Status: AC
  Administered 2021-02-23: 1000 mL via INTRAVENOUS

## 2021-02-23 MED ORDER — ONDANSETRON HCL 4 MG/2ML IJ SOLN
4.0000 mg | Freq: Once | INTRAMUSCULAR | Status: AC | PRN
  Administered 2021-02-23: 4 mg via INTRAVENOUS
  Filled 2021-02-23: qty 2

## 2021-02-23 MED ORDER — ONDANSETRON HCL 4 MG/2ML IJ SOLN
4.0000 mg | Freq: Once | INTRAMUSCULAR | Status: AC
  Administered 2021-02-23: 4 mg via INTRAVENOUS
  Filled 2021-02-23: qty 2

## 2021-02-23 MED ORDER — DOCUSATE SODIUM 250 MG PO CAPS: 250.0000 mg | ORAL_CAPSULE | Freq: Every day | ORAL | 0 refills | Status: DC

## 2021-02-23 MED ORDER — MORPHINE SULFATE (PF) 4 MG/ML IV SOLN
4.0000 mg | Freq: Once | INTRAVENOUS | Status: AC
  Administered 2021-02-23: 4 mg via INTRAVENOUS
  Filled 2021-02-23: qty 1

## 2021-02-23 NOTE — Discharge Instructions (Signed)
You have been seen and discharged from the emergency department.  Your CAT scan showed constipation.  Stable hydrated, take stool softener.  Take nausea medicine as needed.  Eat a high-fiber diet.  Follow-up with your primary provider for further evaluation and further care. Take home medications as prescribed. If you have any worsening symptoms or further concerns for your health please return to an emergency department for further evaluation.

## 2021-02-23 NOTE — ED Triage Notes (Signed)
Via EMS for LLQ abd pain, N/V x 3 hrs that she woke up, radiates towards umbilicus. EMS reports abd guarding, would not allow exam further, 140/90, Hr60s, afebrile, CBG 131mg /dL. H/o tubal ligation. Last intake last night. Denies etOH. Denies previous episodes.

## 2021-02-23 NOTE — ED Provider Notes (Signed)
[facility] EMERGENCY DEPT Provider Note   CSN: [id number] Arrival date & time: 02/23/21  [id number]     History  No chief complaint on file.   Monica Munoz is a 31 y.o. female.  HPI  31 year old female with past medical history of tubal ligation, migraines presents to the emergency department with concern for oozing through protal bleed.  Patient states it woke her up this morning, initially was around her bellybutton but feels more left-sided now.  She has associated nausea/vomiting.  Denies any genitourinary symptoms, no ongoing diarrhea.  Denies any fever.  States she is never had pain like this before.  Home Medications Prior to Admission medications   Medication Sig Start Date End Date Taking? Authorizing Provider  acetaminophen (TYLENOL) 325 MG tablet Take 650 mg by mouth every 6 (six) hours as needed for headache or moderate pain.    [provider]  benzonatate (TESSALON) 100 MG capsule Take 2 capsules (200 mg total) by mouth every 8 (eight) hours. Patient not taking: Reported on 02/23/2021 01/22/21   Tedd Sias, PA  ibuprofen (ADVIL) 600 MG tablet Take 1 tablet (600 mg total) by mouth every 6 (six) hours. Patient not taking: Reported on 01/22/2021 02/25/20   Constant, Peggy, MD  ondansetron (ZOFRAN-ODT) 4 MG disintegrating tablet Take 1 tablet (4 mg total) by mouth every 8 (eight) hours as needed for nausea or vomiting. Patient not taking: Reported on 02/23/2021 01/22/21   Tedd Sias, PA  oxyCODONE-acetaminophen (PERCOCET/ROXICET) 5-325 MG tablet Take 1 tablet by mouth every 6 (six) hours as needed for moderate pain. Patient not taking: Reported on 01/22/2021 02/25/20   Constant, Peggy, MD  promethazine-dextromethorphan (PROMETHAZINE-DM) 6.25-15 MG/5ML syrup Take 5 mLs by mouth 4 (four) times daily as needed for cough. Patient not taking: Reported on 02/23/2021 01/22/21   Tedd Sias, PA      Allergies    Benadryl [diphenhydramine hcl], Reglan  [metoclopramide], Lorazepam, Vancomycin, and Adhesive [tape]    Review of Systems   Review of Systems  Constitutional:  Negative for fever.  Respiratory:  Negative for shortness of breath.   Cardiovascular:  Negative for chest pain.  Gastrointestinal:  Positive for abdominal pain, nausea and vomiting. Negative for diarrhea.  Genitourinary:  Negative for dysuria, hematuria and pelvic pain.  Skin:  Negative for rash.  Neurological:  Negative for headaches.   Physical Exam Updated Vital Signs BP 138/73    Pulse 68    Temp 97.6 F (36.4 C) (Oral)    Resp 18    Wt 54.4 kg    LMP 02/21/2021    SpO2 99%    BMI 19.97 kg/m  Physical Exam Vitals and nursing note reviewed.  Constitutional:      Appearance: Normal appearance.  HENT:     Head: Normocephalic.     Mouth/Throat:     Mouth: Mucous membranes are moist.  Cardiovascular:     Rate and Rhythm: Normal rate.  Pulmonary:     Effort: Pulmonary effort is normal. No respiratory distress.  Abdominal:     Palpations: Abdomen is soft. There is no mass.     Tenderness: There is abdominal tenderness. There is no guarding or rebound.     Hernia: No hernia is present.  Skin:    General: Skin is warm.  Neurological:     Mental Status: She is alert and oriented to person, place, and time. Mental status is at baseline.  Psychiatric:        Mood  and Affect: Mood normal.    ED Results / Procedures / Treatments   Labs (all labs ordered are listed, but only abnormal results are displayed) Labs Reviewed  COMPREHENSIVE METABOLIC PANEL - Abnormal; Notable for the following components:      Result Value   Glucose, Bld 136 (*)    All other components within normal limits  CBC - Abnormal; Notable for the following components:   WBC 17.5 (*)    Hemoglobin 11.3 (*)    HCT 35.7 (*)    RDW 16.7 (*)    Platelets 419 (*)    All other components within normal limits  LIPASE, BLOOD  PREGNANCY, URINE  URINALYSIS, ROUTINE W REFLEX MICROSCOPIC     EKG None  Radiology No results found.  Procedures Procedures    Medications Ordered in ED Medications  ondansetron (ZOFRAN) injection 4 mg (4 mg Intravenous Given 02/23/21 0913)  sodium chloride 0.9 % bolus 1,000 mL (1,000 mLs Intravenous New Bag/Given 02/23/21 0959)  morphine (PF) 4 MG/ML injection 4 mg (4 mg Intravenous Given 02/23/21 1002)    ED Course/ Medical Decision Making/ A&P                           Medical Decision Making Amount and/or Complexity of Data Reviewed Labs: ordered. Radiology: ordered.  Risk Prescription drug management.   31 year old female presents emergency department with left-sided abdominal pain, nausea/vomiting.  Vitals are normal and stable on arrival, no fever.  Tender abdomen but no peritonitis.  Blood work shows a mild leukocytosis but is otherwise reassuring, normal abdominal labs.  Patient has had a tubal ligation and pregnancy test is negative. FLU and COVID negative.  Patient given IV medication for symptoms.  CT of the abdomen pelvis shows small amount of constipation on the left side where her tenderness is.  No other acute finding.  After IV medicine patient feels better.  Plan to treat as an outpatient for nausea and constipation.  Patient at this time appears safe and stable for discharge and close outpatient follow up. Discharge plan and strict return to ED precautions discussed, patient verbalizes understanding and agreement.        Final Clinical Impression(s) / ED Diagnoses Final diagnoses:  None    Rx / DC Orders ED Discharge Orders     None         Lorelle Gibbs, DO 02/23/21 1225

## 2021-02-26 ENCOUNTER — Other Ambulatory Visit: Payer: Self-pay

## 2021-02-26 ENCOUNTER — Encounter (HOSPITAL_COMMUNITY): Payer: Self-pay

## 2021-02-26 ENCOUNTER — Emergency Department (HOSPITAL_COMMUNITY)
Admission: EM | Admit: 2021-02-26 | Discharge: 2021-02-26 | Disposition: A | Discharge status: Home / Self Care | Payer: Medicaid Other | Attending: Emergency Medicine | Admitting: Emergency Medicine

## 2021-02-26 DIAGNOSIS — R1084 Generalized abdominal pain: Secondary | ICD-10-CM | POA: Diagnosis not present

## 2021-02-26 DIAGNOSIS — R6883 Chills (without fever): Secondary | ICD-10-CM | POA: Insufficient documentation

## 2021-02-26 DIAGNOSIS — R112 Nausea with vomiting, unspecified: Secondary | ICD-10-CM | POA: Diagnosis not present

## 2021-02-26 DIAGNOSIS — R197 Diarrhea, unspecified: Secondary | ICD-10-CM | POA: Diagnosis not present

## 2021-02-26 LAB — URINALYSIS, ROUTINE W REFLEX MICROSCOPIC
Bilirubin Urine: NEGATIVE
Glucose, UA: NEGATIVE mg/dL
Hgb urine dipstick: NEGATIVE
Ketones, ur: 20 mg/dL — AB
Leukocytes,Ua: NEGATIVE
Nitrite: NEGATIVE
Protein, ur: NEGATIVE mg/dL
Specific Gravity, Urine: 1.009 (ref 1.005–1.030)
pH: 8 (ref 5.0–8.0)

## 2021-02-26 LAB — RAPID URINE DRUG SCREEN, HOSP PERFORMED
Amphetamines: NOT DETECTED
Barbiturates: NOT DETECTED
Benzodiazepines: NOT DETECTED
Cocaine: NOT DETECTED
Opiates: POSITIVE — AB
Tetrahydrocannabinol: POSITIVE — AB

## 2021-02-26 LAB — CBC WITH DIFFERENTIAL/PLATELET
Abs Immature Granulocytes: 0.07 10*3/uL (ref 0.00–0.07)
Basophils Absolute: 0.1 10*3/uL (ref 0.0–0.1)
Basophils Relative: 1 %
Eosinophils Absolute: 0.3 10*3/uL (ref 0.0–0.5)
Eosinophils Relative: 2 %
HCT: 34.9 % — ABNORMAL LOW (ref 36.0–46.0)
Hemoglobin: 11.1 g/dL — ABNORMAL LOW (ref 12.0–15.0)
Immature Granulocytes: 1 %
Lymphocytes Relative: 20 %
Lymphs Abs: 2.9 10*3/uL (ref 0.7–4.0)
MCH: 27.9 pg (ref 26.0–34.0)
MCHC: 31.8 g/dL (ref 30.0–36.0)
MCV: 87.7 fL (ref 80.0–100.0)
Monocytes Absolute: 0.9 10*3/uL (ref 0.1–1.0)
Monocytes Relative: 6 %
Neutro Abs: 10.4 10*3/uL — ABNORMAL HIGH (ref 1.7–7.7)
Neutrophils Relative %: 70 %
Platelets: 432 10*3/uL — ABNORMAL HIGH (ref 150–400)
RBC: 3.98 MIL/uL (ref 3.87–5.11)
RDW: 16.2 % — ABNORMAL HIGH (ref 11.5–15.5)
WBC: 14.6 10*3/uL — ABNORMAL HIGH (ref 4.0–10.5)
nRBC: 0 % (ref 0.0–0.2)

## 2021-02-26 LAB — I-STAT BETA HCG BLOOD, ED (MC, WL, AP ONLY): I-stat hCG, quantitative: <5 m[IU]/mL (ref <5)

## 2021-02-26 LAB — COMPREHENSIVE METABOLIC PANEL
ALT: 23 U/L (ref 0–44)
AST: 18 U/L (ref 15–41)
Albumin: 3.4 g/dL — ABNORMAL LOW (ref 3.5–5.0)
Alkaline Phosphatase: 82 U/L (ref 38–126)
Anion gap: 10 (ref 5–15)
BUN: 11 mg/dL (ref 6–20)
CO2: 23 mmol/L (ref 22–32)
Calcium: 8.6 mg/dL — ABNORMAL LOW (ref 8.9–10.3)
Chloride: 104 mmol/L (ref 98–111)
Creatinine, Ser: 0.73 mg/dL (ref 0.44–1.00)
GFR, Estimated: >60 mL/min (ref >60)
Glucose, Bld: 108 mg/dL — ABNORMAL HIGH (ref 70–99)
Potassium: 3.4 mmol/L — ABNORMAL LOW (ref 3.5–5.1)
Sodium: 137 mmol/L (ref 135–145)
Total Bilirubin: 0.2 mg/dL — ABNORMAL LOW (ref 0.3–1.2)
Total Protein: 6.2 g/dL — ABNORMAL LOW (ref 6.5–8.1)

## 2021-02-26 LAB — LIPASE, BLOOD: Lipase: 36 U/L (ref 11–51)

## 2021-02-26 MED ORDER — PROCHLORPERAZINE EDISYLATE 10 MG/2ML IJ SOLN
10.0000 mg | Freq: Once | INTRAMUSCULAR | Status: AC
  Administered 2021-02-26: 10 mg via INTRAVENOUS
  Filled 2021-02-26: qty 2

## 2021-02-26 MED ORDER — FAMOTIDINE IN NACL 20-0.9 MG/50ML-% IV SOLN
20.0000 mg | Freq: Once | INTRAVENOUS | Status: AC
  Administered 2021-02-26: 20 mg via INTRAVENOUS
  Filled 2021-02-26: qty 50

## 2021-02-26 MED ORDER — ONDANSETRON HCL 4 MG/2ML IJ SOLN
4.0000 mg | Freq: Once | INTRAMUSCULAR | Status: AC
  Administered 2021-02-26: 4 mg via INTRAVENOUS
  Filled 2021-02-26: qty 2

## 2021-02-26 MED ORDER — MORPHINE SULFATE (PF) 4 MG/ML IV SOLN
4.0000 mg | Freq: Once | INTRAVENOUS | Status: AC
  Administered 2021-02-26: 4 mg via INTRAVENOUS
  Filled 2021-02-26: qty 1

## 2021-02-26 MED ORDER — DROPERIDOL 2.5 MG/ML IJ SOLN
2.5000 mg | Freq: Once | INTRAMUSCULAR | Status: DC
Start: 2021-02-26 — End: 2021-02-26

## 2021-02-26 MED ORDER — SODIUM CHLORIDE 0.9 % IV BOLUS
1000.0000 mL | Freq: Once | INTRAVENOUS | Status: AC
  Administered 2021-02-26: 1000 mL via INTRAVENOUS

## 2021-02-26 MED ORDER — LORAZEPAM 2 MG/ML IJ SOLN
1.0000 mg | Freq: Once | INTRAMUSCULAR | Status: AC
  Administered 2021-02-26: 1 mg via INTRAVENOUS
  Filled 2021-02-26: qty 1

## 2021-02-26 MED ORDER — SODIUM CHLORIDE 0.9 % IV SOLN: INTRAVENOUS | Status: DC

## 2021-02-26 NOTE — ED Triage Notes (Signed)
Pt arrives via ems from home. Pt woke up with abdominal pain, nausea, and vomiting.

## 2021-02-26 NOTE — ED Provider Notes (Signed)
[facility]   CSN: [id number] Arrival date & time: 02/26/21  0732     History  Chief Complaint  Patient presents with   Abdominal Pain   Nausea   Emesis    Monica Munoz is a 31 y.o. female.  31 year old female with no past medical history presents to the ED with a chief complaint of nausea, vomiting, diarrhea x days.  Patient reports she was valuated in the emergency department approximately 2 days ago, had severe abdominal pain, was told that she was likely dehydrated and constipated.  States that since this visit she has had multiple episodes of diarrhea without any blood in her stool.  She is endorsing some lower abdominal pain, stabbing in nature, somewhat alleviated by extending her bilateral knees towards her chest.  Was given Zofran for nausea, reports taking this medication without any improvement in her nausea.  She states the pain along her lower abdomen began last night.  Menstrual period ended approximately 2 days ago.  She is without any urinary symptoms, fevers, vaginal bleeding.  She does endorse some chills. No Prior surgical intervention of her abdomen.   The history is provided by the patient and medical records.  Abdominal Pain Pain location:  Suprapubic, LLQ and RLQ Pain quality: sharp   Pain radiates to:  Does not radiate Pain severity:  Severe Onset quality:  Gradual Associated symptoms: chills, diarrhea, nausea and vomiting   Associated symptoms: no chest pain, no fever, no shortness of breath, no sore throat, no vaginal bleeding and no vaginal discharge   Emesis Associated symptoms: abdominal pain, chills and diarrhea   Associated symptoms: no fever, no headaches and no sore throat       Home Medications Prior to Admission medications   Medication Sig Start Date End Date Taking? Authorizing Provider  acetaminophen (TYLENOL) 325 MG tablet Take 650 mg by mouth every 6 (six) hours as needed for headache or  moderate pain.   Yes [provider]  docusate sodium (COLACE) 250 MG capsule Take 1 capsule (250 mg total) by mouth daily. 02/23/21  Yes Horton, Clabe SealKristie M, DO  ondansetron (ZOFRAN) 4 MG tablet Take 1 tablet (4 mg total) by mouth every 8 (eight) hours as needed for nausea or vomiting. 02/23/21  Yes Horton, Kristie M, DO  benzonatate (TESSALON) 100 MG capsule Take 2 capsules (200 mg total) by mouth every 8 (eight) hours. Patient not taking: Reported on 02/23/2021 01/22/21   Gailen ShelterFondaw, Wylder S, PA  ibuprofen (ADVIL) 600 MG tablet Take 1 tablet (600 mg total) by mouth every 6 (six) hours. Patient not taking: Reported on 01/22/2021 02/25/20   Constant, Peggy, MD  ondansetron (ZOFRAN-ODT) 4 MG disintegrating tablet Take 1 tablet (4 mg total) by mouth every 8 (eight) hours as needed for nausea or vomiting. Patient not taking: Reported on 02/23/2021 01/22/21   Gailen ShelterFondaw, Wylder S, PA  oxyCODONE-acetaminophen (PERCOCET/ROXICET) 5-325 MG tablet Take 1 tablet by mouth every 6 (six) hours as needed for moderate pain. Patient not taking: Reported on 01/22/2021 02/25/20   Constant, Peggy, MD  promethazine-dextromethorphan (PROMETHAZINE-DM) 6.25-15 MG/5ML syrup Take 5 mLs by mouth 4 (four) times daily as needed for cough. Patient not taking: Reported on 02/23/2021 01/22/21   Gailen ShelterFondaw, Wylder S, PA      Allergies    Benadryl [diphenhydramine hcl], Reglan [metoclopramide], Lorazepam, Vancomycin, and Adhesive [tape]    Review of Systems   Review of Systems  Constitutional:  Positive for chills. Negative for  fever.  HENT:  Negative for sore throat.   Respiratory:  Negative for shortness of breath.   Cardiovascular:  Negative for chest pain.  Gastrointestinal:  Positive for abdominal pain, diarrhea, nausea and vomiting.  Genitourinary:  Negative for difficulty urinating, flank pain, urgency, vaginal bleeding and vaginal discharge.  Musculoskeletal:  Negative for back pain.  Neurological:  Negative for light-headedness and  headaches.  All other systems reviewed and are negative.  Physical Exam Updated Vital Signs BP 100/62    Pulse 69    Temp 97.8 F (36.6 C) (Oral)    Resp 18    Ht 5\' 5"  (1.651 m)    Wt 55.8 kg    LMP 02/21/2021    SpO2 99%    BMI 20.47 kg/m  Physical Exam Vitals and nursing note reviewed.  Constitutional:      Appearance: She is ill-appearing.     Comments: Appears uncomfortable.   HENT:     Head: Normocephalic and atraumatic.  Cardiovascular:     Rate and Rhythm: Normal rate.  Pulmonary:     Effort: Pulmonary effort is normal.     Breath sounds: No wheezing.  Abdominal:     General: Abdomen is flat. Bowel sounds are decreased.     Palpations: Abdomen is soft.     Tenderness: There is generalized abdominal tenderness and tenderness in the right lower quadrant, suprapubic area and left lower quadrant. There is guarding. There is no right CVA tenderness or left CVA tenderness.  Skin:    General: Skin is warm and dry.  Neurological:     Mental Status: She is alert and oriented to person, place, and time.    ED Results / Procedures / Treatments   Labs (all labs ordered are listed, but only abnormal results are displayed) Labs Reviewed  COMPREHENSIVE METABOLIC PANEL - Abnormal; Notable for the following components:      Result Value   Potassium 3.4 (*)    Glucose, Bld 108 (*)    Calcium 8.6 (*)    Total Protein 6.2 (*)    Albumin 3.4 (*)    Total Bilirubin 0.2 (*)    All other components within normal limits  CBC WITH DIFFERENTIAL/PLATELET - Abnormal; Notable for the following components:   WBC 14.6 (*)    Hemoglobin 11.1 (*)    HCT 34.9 (*)    RDW 16.2 (*)    Platelets 432 (*)    Neutro Abs 10.4 (*)    All other components within normal limits  URINALYSIS, ROUTINE W REFLEX MICROSCOPIC - Abnormal; Notable for the following components:   Color, Urine STRAW (*)    APPearance HAZY (*)    Ketones, ur 20 (*)    All other components within normal limits  RAPID URINE DRUG  SCREEN, HOSP PERFORMED - Abnormal; Notable for the following components:   Opiates POSITIVE (*)    Tetrahydrocannabinol POSITIVE (*)    All other components within normal limits  LIPASE, BLOOD  I-STAT BETA HCG BLOOD, ED ([facility], [facility], [facility] ONLY)    EKG EKG Interpretation  Date/Time:  Saturday February 26 2021 09:15:18 EST Ventricular Rate:  79 PR Interval:  162 QRS Duration: 95 QT Interval:  444 QTC Calculation: 509 R Axis:   73 Text Interpretation: Sinus arrhythmia Borderline T wave abnormalities Prolonged QT interval similar to Feb 23 2021 Confirmed by [birth year] [phone]) on 02/26/2021 9:16:43 AM  Radiology No results found.  Procedures Procedures    Medications Ordered in ED Medications  sodium chloride 0.9 % bolus 1,000 mL (0 mLs Intravenous Stopped 02/26/21 1006)    And  0.9 %  sodium chloride infusion ( Intravenous New Bag/Given 02/26/21 0751)  ondansetron (ZOFRAN) injection 4 mg (4 mg Intravenous Given 02/26/21 0752)  morphine (PF) 4 MG/ML injection 4 mg (4 mg Intravenous Given 02/26/21 0753)  prochlorperazine (COMPAZINE) injection 10 mg (10 mg Intravenous Given 02/26/21 0830)  LORazepam (ATIVAN) injection 1 mg (1 mg Intravenous Given 02/26/21 0935)  famotidine (PEPCID) IVPB 20 mg premix (0 mg Intravenous Stopped 02/26/21 1006)    ED Course/ Medical Decision Making/ A&P Clinical Course as of 02/26/21 1430  Sat Feb 26, 2021  1305 Opiates(!): POSITIVE [JS]  1305 Tetrahydrocannabinol(!): POSITIVE [JS]    Clinical Course User Index [JS] Claude Manges, PA-C                           Medical Decision Making Amount and/or Complexity of Data Reviewed Labs: ordered. Decision-making details documented in ED Course.  Risk Prescription drug management.  This patient presents to the ED for concern of nausea, vomiting, diarrhea this involves a number of treatment options, and is a complaint that carries with it a high risk of complications and morbidity.  The differential diagnosis  includes tractable nausea, vomiting, obstruction, hyperemesis cannabinoid.   Co morbidities: Discussed in HPI   Brief History:  31 year old evaluated 2 days ago with similar symptoms abdominal pain, nausea and constipation.  Reports since her visit she has had multiple episodes of diarrhea without any blood in her stool.  Does endorse daily marijuana use but stopped smoking marijuana approximately 2 days ago due to feeling unwell.  Has tried some Zofran at home without any improvement in her symptoms.  Received 150 mics of fentanyl from EMS.  EMR reviewed including pt PMHx, past surgical history and past visits to ER.   See HPI for more details   Lab Tests:  I ordered and independently interpreted labs.  The pertinent results include:    Labs notable for WBCs improved from her prior visit.  No electrolyte derangement despite her multiple episodes of vomiting no elevation in her BUN to suggest dehydration.  Creatinine levels within normal limits.  LFTs are unremarkable.  CG is negative.   Imaging Studies: Patient had CT abdomen 2 days ago which showed some constipation, she reports this has resolved since then.  Cardiac Monitoring:  The patient was maintained on a cardiac monitor.  I personally viewed and interpreted the cardiac monitored which showed an underlying rhythm of: EKG non-ischemic however that show a QTc around the 500s, suspect likely due to recurrent antiemetics.  Did not give patient droperidol, discontinued this and provided her with some Ativan.   Medicines ordered:  I ordered medication including Zofran, morphine to help with symptomatic control along with Compazine.  Also received fluids. Reevaluation of the patient after these medicines showed that the patient improved I have reviewed the patients home medicines and have made adjustments as needed  Reevaluation:  After the interventions noted above I re-evaluated patient and found that they have  :resolved   Social Determinants of Health:  The patient's social determinants of health were a factor in the care of this patient    Problem List / ED Course:  Patient with no past medical history presents to the ED with nausea, vomiting, diarrhea, evaluated 2 days ago.  Unremarkable work-up 2 days ago although CT suggested constipation.  Examination today and  labs appear to be improved.  She is tolerating p.o. adequately after Ativan, antiemetics.  Tempted to give patient medicine however patient's QTc is prolonged.   Dispostion:  After consideration of the diagnostic results and the patients response to treatment, I feel that the patent would benefit from continued hydration at home, stopping marijuana use as this likely was hyperemesis cannabinoid.  She is aware to discontinue marijuana use, will need to follow up with PCP.   Portions of this note were generated with Scientist, clinical (histocompatibility and immunogenetics). Dictation errors may occur despite best attempts at proofreading.   Final Clinical Impression(s) / ED Diagnoses Final diagnoses:  Nausea vomiting and diarrhea    Rx / DC Orders ED Discharge Orders     None         Claude Manges, PA-C 02/26/21 1430    Pricilla Loveless, MD 02/26/21 1558

## 2021-02-26 NOTE — ED Notes (Signed)
PO fluids provided to patient

## 2021-02-26 NOTE — Discharge Instructions (Addendum)
Please discontinue marijuana use as we discussed.  If you experience any worsening symptoms you may need to return to the emergency department.

## 2021-11-15 NOTE — Congregational Nurse Program (Signed)
  Dept: [phone]   Congregational Nurse Program Note  Date of Encounter: 11/15/2021  Clinic visit for swelling mid to outer portion of right upper eyelid.  Sclera is not red and no drainage noted from the swollen area.  Recommended use of warm moist compress to eye for no more than ten minutes several time a day and if no improvement seek medical attention.  Resident does not have a PCP, discussed that having a PCP is beneficial and can prevent unnecessary emergency visits. 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 Yes    Student Assistance N/A    Location Patient [facility]    Visit Setting with Client Organization    Patient Status Unknown   [facility]memt [facility]    Insurance/Financial Assistance Referral N/A    Medication N/A    Medical Provider No    Screening Referrals Made Annual Wellness Visit    Medical Referrals Made N/A    Medical Appointment Made N/A    Recently w/o PCP, now 1st time PCP visit completed due to CNs referral or appointment made N/A    Food N/A    Transportation N/A    Housing/Utilities N/A    Interpersonal Safety N/A    Interventions Counsel;Advocate/Support    Abnormal to Normal Screening Since Last CN Visit N/A    Screenings CN Performed Blood Pressure;Pulse Ox    Sent Client to Lab for: N/A    Did client attend any of the following based off CNs referral or appointments made? N/A    ED Visit Averted N/A     Life-Saving Intervention Made N/A

## 2023-10-28 ENCOUNTER — Encounter (HOSPITAL_COMMUNITY): Payer: Self-pay

## 2023-10-28 ENCOUNTER — Ambulatory Visit (HOSPITAL_COMMUNITY)
Admission: RE | Admit: 2023-10-28 | Discharge: 2023-10-28 | Disposition: A | Discharge status: Home / Self Care | Source: Ambulatory Visit | Attending: Emergency Medicine | Admitting: Emergency Medicine

## 2023-10-28 ENCOUNTER — Ambulatory Visit (INDEPENDENT_AMBULATORY_CARE_PROVIDER_SITE_OTHER)

## 2023-10-28 VITALS — BP 103/67 | HR 71 | Temp 98.2°F | Resp 16

## 2023-10-28 DIAGNOSIS — M79645 Pain in left finger(s): Secondary | ICD-10-CM | POA: Diagnosis not present

## 2023-10-28 DIAGNOSIS — M79644 Pain in right finger(s): Secondary | ICD-10-CM

## 2023-10-28 DIAGNOSIS — M25521 Pain in right elbow: Secondary | ICD-10-CM | POA: Diagnosis not present

## 2023-10-28 NOTE — Discharge Instructions (Signed)
 I did not see any fractures or breaks on your x-ray.  Official radiology overread will be back over the next few hours and you will be contacted if further follow-up is needed.  You can take ibuprofen  800 mg every 8 hours to help with pain or stiffness.  Wear the finger cage to help prevent further injury.  It is important that you establish with a primary care provider for routine monitoring and management of ongoing issues.  If your finger or elbow pain persist please follow-up with an orthopedic.  Return to clinic for new or urgent symptoms.

## 2023-10-28 NOTE — ED Provider Notes (Signed)
 [facility]    CSN: [id number] Arrival date & time: 10/28/23  1406      History   Chief Complaint Chief Complaint  Patient presents with   Finger Injury    Finger pain to elbow pain.    HPI Monica Munoz is a 33 y.o. female.   Patient presents to clinic over concern of right distal index finger pain for the past 2 months.  She was at work when she jammed her index finger into the back of a metal rack and has been having pain since then.  Feels like the finger is more swollen.  When she hits the finger again she will have pain, feels like it travels into her hand.  Is unable to lift heavy objects such as juice without inducing pain of the index finger.  Yesterday at work she was attempting to mop when she had a sharp pain in her right elbow that made her stop mopping.  Has not had any trauma, injuries or falls.  Has not had any swelling.  Has not tried medications or interventions. Reports mother has history of Sjogren's.   The history is provided by the patient and medical records.    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   Gestational diabetes    History of postpartum hemorrhage    Manic depression (HCC)    Migraine with aura 04/25/2012   Pelvic inflammatory disease 04/25/2015    Patient Active Problem List   Diagnosis Date Noted   History of bilateral salpingectomy 02/24/2020   Encounter for elective induction of labor 02/23/2020   Gestational diabetes 12/26/2019   Pregnancy related symphysis pain, antepartum 12/09/2019   History of postpartum hemorrhage, currently pregnant 10/28/2019   Family history of genetic disorder 10/28/2019   Supervision of high risk pregnancy, antepartum 10/28/2019   UTI in pregnancy, antepartum, third trimester 10/25/2019   Allergic rhinitis due to pollen 12-03-2016    Death of infant April 17, 2016   Anxiety 05/26/2013   Asthma, mild intermittent 05/26/2013   Depression 05/26/2013   Headache, migraine 01/29/2012    Past Surgical History:  Procedure Laterality Date   TUBAL LIGATION Bilateral 02/24/2020   Procedure: POST PARTUM TUBAL LIGATION;  Surgeon: Barbra Lang PARAS, DO;  Location: [facility];  Service: Gynecology;  Laterality: Bilateral;   WISDOM TOOTH EXTRACTION      OB History     Gravida  6   Para  6   Term  6   Preterm  0   AB  0   Living  5      SAB  0   IAB  0   Ectopic  0   Multiple  0   Live Births  6        Obstetric Comments  Deceased at 11 months from cyst in throat.           Home Medications    Prior to Admission medications   Not on File    Family History Family History  Problem Relation Age of Onset   Diabetes Maternal Grandfather    Hyperlipidemia Maternal Grandfather    Stroke Maternal Grandfather    Cancer Maternal Grandfather    Hypertension Maternal Grandfather    Heart disease Maternal Grandfather    Heart murmur Mother    Miscarriages /  Stillbirths Mother    Diabetes Mother    Stroke Mother    Lupus Mother    Heart disease Father     Social History Social History   Tobacco Use   Smoking status: Never   Smokeless tobacco: Never  Substance Use Topics   Alcohol use: No   Drug use: Yes    Types: Marijuana     Allergies   Benadryl  [diphenhydramine  hcl], Reglan  [metoclopramide ], Lorazepam , Vancomycin , and Adhesive [tape]   Review of Systems Review of Systems  Per HPI  Physical Exam Triage Vital Signs ED Triage Vitals  Encounter Vitals Group     BP 10/28/23 1423 103/67     Girls Systolic BP Percentile --      Girls Diastolic BP Percentile --      Boys Systolic BP Percentile --      Boys Diastolic BP Percentile --      Pulse Rate 10/28/23 1423 71     Resp 10/28/23 1423 16     Temp 10/28/23 1423 98.2 F (36.8 C)     Temp Source 10/28/23 1423 Oral     SpO2  10/28/23 1423 98 %     Weight --      Height --      Head Circumference --      Peak Flow --      Pain Score 10/28/23 1424 6     Pain Loc --      Pain Education --      Exclude from Growth Chart --    No data found.  Updated Vital Signs BP 103/67 (BP Location: Right Arm)   Pulse 71   Temp 98.2 F (36.8 C) (Oral)   Resp 16   LMP 10/24/2023 (Approximate)   SpO2 98%   Breastfeeding No   Visual Acuity Right Eye Distance:   Left Eye Distance:   Bilateral Distance:    Right Eye Near:   Left Eye Near:    Bilateral Near:     Physical Exam Vitals and nursing note reviewed.  Constitutional:      Appearance: Normal appearance.  HENT:     Head: Normocephalic and atraumatic.     Right Ear: External ear normal.     Left Ear: External ear normal.     Nose: Nose normal.     Mouth/Throat:     Mouth: Mucous membranes are moist.  Eyes:     Conjunctiva/sclera: Conjunctivae normal.  Cardiovascular:     Rate and Rhythm: Normal rate.     Pulses: Normal pulses.  Pulmonary:     Effort: Pulmonary effort is normal. No respiratory distress.  Musculoskeletal:        General: Tenderness present. Normal range of motion.     Right hand: Bony tenderness present. No swelling. Normal range of motion. There is no disruption of two-point discrimination. Normal capillary refill.     Comments: Bony tenderness over right index finger DIP joint.  No obvious swelling or deformity.  Brisk capillary refill distally.  Full range of motion of the right elbow without swelling, trauma or deformity.  Skin:    General: Skin is warm and dry.  Neurological:     General: No focal deficit present.     Mental Status: She is alert and oriented to person, place, and time.  Psychiatric:        Mood and Affect: Mood normal.        Behavior: Behavior normal. Behavior is cooperative.  UC Treatments / Results  Labs (all labs ordered are listed, but only abnormal results are displayed) Labs Reviewed - No  data to display  EKG   Radiology No results found.  Procedures Procedures (including critical care time)  Medications Ordered in UC Medications - No data to display  Initial Impression / Assessment and Plan / UC Course  I have reviewed the triage vital signs and the nursing notes.  Pertinent labs & imaging results that were available during my care of the patient were reviewed by me and considered in my medical decision making (see chart for details).  Vitals and triage reviewed, patient is hemodynamically stable.  Right index finger with DIP tenderness.  Full range of motion.  No acute bony abnormality.  No obvious swelling or deformity.  Brisk capillary refill distally.  Imaging by my interpretation does not show any bony abnormality, awaiting official radiology overread.  Symptomatic management for finger pain discussed.  Right elbow with full range of motion, atraumatic, imaging deferred.  Symptomatic management for pain discussed.  Plan of care, follow-up care return precautions given, no questions at this time.  Work note provided.     Final Clinical Impressions(s) / UC Diagnoses   Final diagnoses:  Pain of finger of left hand  Right elbow pain     Discharge Instructions      I did not see any fractures or breaks on your x-ray.  Official radiology overread will be back over the next few hours and you will be contacted if further follow-up is needed.  You can take ibuprofen  800 mg every 8 hours to help with pain or stiffness.  Wear the finger cage to help prevent further injury.  It is important that you establish with a primary care provider for routine monitoring and management of ongoing issues.  If your finger or elbow pain persist please follow-up with an orthopedic.  Return to clinic for new or urgent symptoms.      ED Prescriptions   None    PDMP not reviewed this encounter.   Dreama, Bailei Buist  N, FNP 10/28/23 1531

## 2023-10-28 NOTE — ED Triage Notes (Signed)
 Patient here today with c/o right index finger pain X 2 months. 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 Patient has not seen anyone for the pain yet.   Patient also noticed form pain in her right elbow since last week. Patient noticed the pain in her elbow when she was using the spin mop at work.
